# Patient Record
Sex: Female | Born: 1960 | Race: White | Hispanic: No | Marital: Single | Smoking: Current some day smoker
Health system: Southern US, Community
[De-identification: ages and names within clinical notes are randomized; demographics above are authoritative.]

## PROBLEM LIST (undated history)

## (undated) DIAGNOSIS — I10 Essential (primary) hypertension: Secondary | ICD-10-CM

## (undated) DIAGNOSIS — M1990 Unspecified osteoarthritis, unspecified site: Secondary | ICD-10-CM

## (undated) DIAGNOSIS — J45909 Unspecified asthma, uncomplicated: Secondary | ICD-10-CM

## (undated) DIAGNOSIS — E079 Disorder of thyroid, unspecified: Secondary | ICD-10-CM

## (undated) DIAGNOSIS — F419 Anxiety disorder, unspecified: Secondary | ICD-10-CM

## (undated) DIAGNOSIS — I509 Heart failure, unspecified: Secondary | ICD-10-CM

## (undated) DIAGNOSIS — I219 Acute myocardial infarction, unspecified: Secondary | ICD-10-CM

## (undated) DIAGNOSIS — E785 Hyperlipidemia, unspecified: Secondary | ICD-10-CM

## (undated) DIAGNOSIS — T8092XA Unspecified transfusion reaction, initial encounter: Secondary | ICD-10-CM

## (undated) DIAGNOSIS — R197 Diarrhea, unspecified: Secondary | ICD-10-CM

## (undated) DIAGNOSIS — T7840XA Allergy, unspecified, initial encounter: Secondary | ICD-10-CM

## (undated) HISTORY — DX: Essential (primary) hypertension: I10

## (undated) HISTORY — DX: Anxiety disorder, unspecified: F41.9

## (undated) HISTORY — PX: ANKLE SURGERY: SHX546

## (undated) HISTORY — DX: Disorder of thyroid, unspecified: E07.9

## (undated) HISTORY — DX: Unspecified asthma, uncomplicated: J45.909

## (undated) HISTORY — DX: Unspecified osteoarthritis, unspecified site: M19.90

## (undated) HISTORY — PX: KNEE SURGERY: SHX244

## (undated) HISTORY — DX: Unspecified transfusion reaction, initial encounter: T80.92XA

## (undated) HISTORY — PX: WRIST SURGERY: SHX841

## (undated) HISTORY — DX: Hyperlipidemia, unspecified: E78.5

## (undated) HISTORY — DX: Allergy, unspecified, initial encounter: T78.40XA

## (undated) HISTORY — PX: TUBAL LIGATION: SHX77

## (undated) HISTORY — DX: Heart failure, unspecified: I50.9

## (undated) HISTORY — DX: Diarrhea, unspecified: R19.7

## (undated) HISTORY — DX: Acute myocardial infarction, unspecified: I21.9

## (undated) HISTORY — PX: JOINT REPLACEMENT: SHX530

## (undated) HISTORY — PX: ABDOMINAL HYSTERECTOMY: SHX81

---

## 1995-05-15 HISTORY — PX: BACK SURGERY: SHX140

## 1999-05-15 HISTORY — PX: FRACTURE SURGERY: SHX138

## 2012-05-14 DIAGNOSIS — I219 Acute myocardial infarction, unspecified: Secondary | ICD-10-CM

## 2012-05-14 HISTORY — DX: Acute myocardial infarction, unspecified: I21.9

## 2015-11-09 DIAGNOSIS — I10 Essential (primary) hypertension: Secondary | ICD-10-CM | POA: Insufficient documentation

## 2015-11-09 DIAGNOSIS — G894 Chronic pain syndrome: Secondary | ICD-10-CM | POA: Insufficient documentation

## 2016-01-05 DIAGNOSIS — Z9861 Coronary angioplasty status: Secondary | ICD-10-CM

## 2016-01-05 DIAGNOSIS — F411 Generalized anxiety disorder: Secondary | ICD-10-CM | POA: Insufficient documentation

## 2016-01-05 DIAGNOSIS — E785 Hyperlipidemia, unspecified: Secondary | ICD-10-CM | POA: Insufficient documentation

## 2016-01-05 DIAGNOSIS — M153 Secondary multiple arthritis: Secondary | ICD-10-CM | POA: Insufficient documentation

## 2016-01-05 DIAGNOSIS — I252 Old myocardial infarction: Secondary | ICD-10-CM | POA: Insufficient documentation

## 2016-01-05 DIAGNOSIS — I2510 Atherosclerotic heart disease of native coronary artery without angina pectoris: Secondary | ICD-10-CM | POA: Insufficient documentation

## 2016-01-05 DIAGNOSIS — I429 Cardiomyopathy, unspecified: Secondary | ICD-10-CM | POA: Insufficient documentation

## 2016-01-05 DIAGNOSIS — F4310 Post-traumatic stress disorder, unspecified: Secondary | ICD-10-CM | POA: Insufficient documentation

## 2016-01-05 DIAGNOSIS — E876 Hypokalemia: Secondary | ICD-10-CM | POA: Insufficient documentation

## 2017-03-05 ENCOUNTER — Ambulatory Visit: Payer: Medicare Other | Attending: Nurse Practitioner | Admitting: Nurse Practitioner

## 2017-03-05 ENCOUNTER — Encounter: Payer: Self-pay | Admitting: Nurse Practitioner

## 2017-03-05 DIAGNOSIS — J45909 Unspecified asthma, uncomplicated: Secondary | ICD-10-CM | POA: Insufficient documentation

## 2017-03-05 DIAGNOSIS — M25552 Pain in left hip: Secondary | ICD-10-CM | POA: Insufficient documentation

## 2017-03-05 DIAGNOSIS — E785 Hyperlipidemia, unspecified: Secondary | ICD-10-CM | POA: Diagnosis not present

## 2017-03-05 DIAGNOSIS — M25561 Pain in right knee: Secondary | ICD-10-CM | POA: Diagnosis not present

## 2017-03-05 DIAGNOSIS — I429 Cardiomyopathy, unspecified: Secondary | ICD-10-CM | POA: Diagnosis not present

## 2017-03-05 DIAGNOSIS — Z96643 Presence of artificial hip joint, bilateral: Secondary | ICD-10-CM | POA: Insufficient documentation

## 2017-03-05 DIAGNOSIS — I252 Old myocardial infarction: Secondary | ICD-10-CM | POA: Diagnosis not present

## 2017-03-05 DIAGNOSIS — G8929 Other chronic pain: Secondary | ICD-10-CM

## 2017-03-05 DIAGNOSIS — Z79899 Other long term (current) drug therapy: Secondary | ICD-10-CM

## 2017-03-05 DIAGNOSIS — M1990 Unspecified osteoarthritis, unspecified site: Secondary | ICD-10-CM | POA: Insufficient documentation

## 2017-03-05 DIAGNOSIS — F17210 Nicotine dependence, cigarettes, uncomplicated: Secondary | ICD-10-CM | POA: Insufficient documentation

## 2017-03-05 DIAGNOSIS — M25562 Pain in left knee: Secondary | ICD-10-CM | POA: Diagnosis not present

## 2017-03-05 DIAGNOSIS — M1992 Post-traumatic osteoarthritis, unspecified site: Secondary | ICD-10-CM | POA: Insufficient documentation

## 2017-03-05 DIAGNOSIS — Z79891 Long term (current) use of opiate analgesic: Secondary | ICD-10-CM | POA: Diagnosis not present

## 2017-03-05 DIAGNOSIS — M25551 Pain in right hip: Secondary | ICD-10-CM | POA: Diagnosis not present

## 2017-03-05 DIAGNOSIS — E079 Disorder of thyroid, unspecified: Secondary | ICD-10-CM | POA: Insufficient documentation

## 2017-03-05 DIAGNOSIS — M545 Low back pain, unspecified: Secondary | ICD-10-CM

## 2017-03-05 DIAGNOSIS — F1190 Opioid use, unspecified, uncomplicated: Secondary | ICD-10-CM | POA: Diagnosis not present

## 2017-03-05 DIAGNOSIS — M542 Cervicalgia: Secondary | ICD-10-CM | POA: Diagnosis not present

## 2017-03-05 DIAGNOSIS — Z90710 Acquired absence of both cervix and uterus: Secondary | ICD-10-CM | POA: Insufficient documentation

## 2017-03-05 DIAGNOSIS — G894 Chronic pain syndrome: Secondary | ICD-10-CM | POA: Insufficient documentation

## 2017-03-05 DIAGNOSIS — F4310 Post-traumatic stress disorder, unspecified: Secondary | ICD-10-CM | POA: Insufficient documentation

## 2017-03-05 DIAGNOSIS — I2510 Atherosclerotic heart disease of native coronary artery without angina pectoris: Secondary | ICD-10-CM | POA: Insufficient documentation

## 2017-03-05 DIAGNOSIS — M5441 Lumbago with sciatica, right side: Secondary | ICD-10-CM

## 2017-03-05 DIAGNOSIS — I509 Heart failure, unspecified: Secondary | ICD-10-CM | POA: Insufficient documentation

## 2017-03-05 DIAGNOSIS — Z8249 Family history of ischemic heart disease and other diseases of the circulatory system: Secondary | ICD-10-CM | POA: Insufficient documentation

## 2017-03-05 DIAGNOSIS — F411 Generalized anxiety disorder: Secondary | ICD-10-CM | POA: Diagnosis not present

## 2017-03-05 DIAGNOSIS — Z9861 Coronary angioplasty status: Secondary | ICD-10-CM | POA: Insufficient documentation

## 2017-03-05 DIAGNOSIS — E876 Hypokalemia: Secondary | ICD-10-CM | POA: Insufficient documentation

## 2017-03-05 DIAGNOSIS — Z7902 Long term (current) use of antithrombotics/antiplatelets: Secondary | ICD-10-CM | POA: Insufficient documentation

## 2017-03-05 DIAGNOSIS — M899 Disorder of bone, unspecified: Secondary | ICD-10-CM | POA: Diagnosis not present

## 2017-03-05 DIAGNOSIS — Z7951 Long term (current) use of inhaled steroids: Secondary | ICD-10-CM | POA: Insufficient documentation

## 2017-03-05 DIAGNOSIS — I110 Hypertensive heart disease with heart failure: Secondary | ICD-10-CM | POA: Diagnosis not present

## 2017-03-05 DIAGNOSIS — Z789 Other specified health status: Secondary | ICD-10-CM

## 2017-03-05 NOTE — Progress Notes (Addendum)
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 than 24 hours prior to the originally scheduled appointment. This allows for the appointment to be offered to other patients. 9. Rescheduling:  When a visit is rescheduled, it will require the cancellation of the original appointment. For this reason they both fall within the category of "Cancellations".  10. Cancellations:  They require advanced notification. Any cancellation less than 24 hours before the  appointment will be recorded as a "No Show". 11. No Show:  Defined as an unkept appointment where the patient failed to notify or declare to the practice their intention or inability to keep the appointment.  Corrective process for repeat offenders:  1. Tardiness: Three (3) episodes of rescheduling due to late arrivals will be recorded as one (1) "No Show". 2. Cancellation or reschedule: Three (3) cancellations or rescheduling will be recorded as one (1) "No Show". 3. "No Shows": Three (3) "No Shows" within a 12 month period will result in discharge from the  practice.  ____________________________________________________________________________________________  ____________________________________________________________________________________________  Pain Scale  Introduction: The pain score used by this practice is the Verbal Numerical Rating Scale (VNRS-11). This is an 11-point scale. It is for adults and children 10 years or older. There are significant differences in how the pain score is reported, used, and applied. Forget everything you learned in the past and learn this scoring system.  General Information: The scale should reflect your current level of pain. Unless you are specifically asked for the level of your  worst pain, or your average pain. If you are asked for one of these two, then it should be understood that it is over the past 24 hours.  Basic Activities of Daily Living (ADL): Personal hygiene, dressing, eating, transferring, and using restroom.  Instructions: Most patients tend to report their level of pain as a combination of two factors, their physical pain and their psychosocial pain. This last one is also known as "suffering" and it is reflection of how physical pain affects you socially and psychologically. From now on, report them separately. From this point on, when asked to report your pain level, report only your physical pain. Use the following table for reference.  Pain Clinic Pain Levels (0-5/10)  Pain Level Score  Description  No Pain 0   Mild pain 1 Nagging, annoying, but does not interfere with basic activities of daily living (ADL). Patients are able to eat, bathe, get dressed, toileting (being able to get on and off the toilet and perform personal hygiene functions), transfer (move in and out of bed or a chair without assistance), and maintain continence (able to control bladder and bowel functions). Blood pressure and heart rate are unaffected. A normal heart rate for a healthy adult ranges from 60 to 100 bpm  (beats per minute).   Mild to moderate pain 2 Noticeable and distracting. Impossible to hide from other people. More frequent flare-ups. Still possible to adapt and function close to normal. It can be very annoying and may have occasional stronger flare-ups. With discipline, patients may get used to it and adapt.   Moderate pain 3 Interferes significantly with activities of daily living (ADL). It becomes difficult to feed, bathe, get dressed, get on and off the toilet or to perform personal hygiene functions. Difficult to get in and out of bed or a chair without assistance. Very distracting. With effort, it can be ignored when deeply involved in activities.   Moderately severe pain 4 Impossible to ignore for more than a few minutes. With effort, patients may still be able to manage work or participate in some social activities. Very difficult to concentrate. Signs of autonomic nervous system discharge are evident: dilated pupils (mydriasis); mild sweating (diaphoresis); sleep interference. Heart rate becomes elevated (>115 bpm). Diastolic blood pressure (lower number) rises above 100 mmHg. Patients find relief in laying down and not moving.   Severe pain 5 Intense and extremely unpleasant. Associated with frowning face and frequent crying. Pain overwhelms the senses.  Ability to do any activity or maintain social relationships becomes significantly limited. Conversation becomes difficult. Pacing back and forth is common, as getting into a comfortable position is nearly impossible. Pain wakes you up from deep sleep. Physical signs will be obvious: pupillary dilation; increased sweating; goosebumps; brisk reflexes; cold, clammy hands and feet; nausea, vomiting or dry heaves; loss of appetite; significant sleep disturbance with inability to fall asleep or to remain asleep. When persistent, significant weight loss is observed due to the complete loss of appetite and sleep deprivation.  Blood pressure and heart  rate becomes significantly elevated. Caution: If elevated blood pressure triggers a pounding headache associated with blurred vision, then the patient should immediately seek attention at an urgent or emergency care unit, as these may be signs of an impending stroke.    Emergency Department Pain Levels (6-10/10)  Emergency Room Pain 6 Severely limiting. Requires emergency care and should not be seen or managed at an outpatient pain management facility. Communication becomes difficult and requires great effort. Assistance  to reach the emergency department may be required. Facial flushing and profuse sweating along with potentially dangerous increases in heart rate and blood pressure will be evident.   Distressing pain 7 Self-care is very difficult. Assistance is required to transport, or use restroom. Assistance to reach the emergency department will be required. Tasks requiring coordination, such as bathing and getting dressed become very difficult.   Disabling pain 8 Self-care is no longer possible. At this level, pain is disabling. The individual is unable to do even the most "basic" activities such as walking, eating, bathing, dressing, transferring to a bed, or toileting. Fine motor skills are lost. It is difficult to think clearly.   Incapacitating pain 9 Pain becomes incapacitating. Thought processing is no longer possible. Difficult to remember your own name. Control of movement and coordination are lost.   The worst pain imaginable 10 At this level, most patients pass out from pain. When this level is reached, collapse of the autonomic nervous system occurs, leading to a sudden drop in blood pressure and heart rate. This in turn results in a temporary and dramatic drop in blood flow to the brain, leading to a loss of consciousness. Fainting is one of the body's self defense mechanisms. Passing out puts the brain in a calmed state and causes it to shut down for a while, in order to begin the  healing process.    Summary: 1. Refer to this scale when providing [place] with your pain level. 2. Be accurate and careful when reporting your pain level. This will help with your care. 3. Over-reporting your pain level will lead to loss of credibility. 4. Even a level of 1/10 means that there is pain and will be treated at our facility. 5. High, inaccurate reporting will be documented as "Symptom Exaggeration", leading to loss of credibility and suspicions of possible secondary gains such as obtaining more narcotics, or wanting to appear disabled, for fraudulent reasons. 6. Only pain levels of 5 or below will be seen at our facility. 7. Pain levels of 6 and above will be sent to the Emergency Department and the appointment cancelled. ____________________________________________________________________________________________

## 2017-03-05 NOTE — Patient Instructions (Addendum)
You have been instructed to get xrays done and see the medical psychologist as soon as possible and call us for 2nd appointment.  _________________________________________________________________________________________  Appointment Policy Summary  It is our goal and responsibility to provide the medical community with assistance in the evaluation and management of patients with chronic pain. Unfortunately our resources are limited. Because we do not have an unlimited amount of time, or available appointments, we are required to closely monitor and manage their use. The following rules exist to maximize their use:  Patient's responsibilities: 1. Punctuality:  At what time should I arrive? You should be physically present in our office 30 minutes before your scheduled appointment. Your scheduled appointment is with your assigned healthcare provider. However, it takes 5-10 minutes to be "checked-in", and another 15 minutes for the nurses to do the admission. If you arrive to our office at the time you were given for your appointment, you will end up being at least 20-25 minutes late to your appointment with the provider. 2. Tardiness:  What happens if I arrive only a few minutes after my scheduled appointment time? You will need to reschedule your appointment. The cutoff is your appointment time. This is why it is so important that you arrive at least 30 minutes before that appointment. If you have an appointment scheduled for 10:00 AM and you arrive at 10:01, you will be required to reschedule your appointment.  3. Plan ahead:  Always assume that you will encounter traffic on your way in. Plan for it. If you are dependent on a driver, make sure they understand these rules and the need to arrive early. 4. Other appointments and responsibilities:  Avoid scheduling any other appointments before or after your pain clinic appointments.  5. Be prepared:  Write down everything that you need to discuss with  your healthcare provider and give this information to the admitting nurse. Write down the medications that you will need refilled. Bring your pills and bottles (even the empty ones), to all of your appointments, except for those where a procedure is scheduled. 6. No children or pets:  Find someone to take care of them. It is not appropriate to bring them in. 7. Scheduling changes:  We request "advanced notification" of any changes or cancellations. 8. Advanced notification:  Defined as a time period of more than 24 hours prior to the originally scheduled appointment. This allows for the appointment to be offered to other patients. 9. Rescheduling:  When a visit is rescheduled, it will require the cancellation of the original appointment. For this reason they both fall within the category of "Cancellations".  10. Cancellations:  They require advanced notification. Any cancellation less than 24 hours before the  appointment will be recorded as a "No Show". 11. No Show:  Defined as an unkept appointment where the patient failed to notify or declare to the practice their intention or inability to keep the appointment.  Corrective process for repeat offenders:  1. Tardiness: Three (3) episodes of rescheduling due to late arrivals will be recorded as one (1) "No Show". 2. Cancellation or reschedule: Three (3) cancellations or rescheduling will be recorded as one (1) "No Show". 3. "No Shows": Three (3) "No Shows" within a 12 month period will result in discharge from the practice.  ____________________________________________________________________________________________  ____________________________________________________________________________________________  Pain Scale  Introduction: The pain score used by this practice is the Verbal Numerical Rating Scale (VNRS-11). This is an 11-point scale. It is for adults and children 10 years  or older. There are significant differences in how the pain  score is reported, used, and applied. Forget everything you learned in the past and learn this scoring system.  General Information: The scale should reflect your current level of pain. Unless you are specifically asked for the level of your worst pain, or your average pain. If you are asked for one of these two, then it should be understood that it is over the past 24 hours.  Basic Activities of Daily Living (ADL): Personal hygiene, dressing, eating, transferring, and using restroom.  Instructions: Most patients tend to report their level of pain as a combination of two factors, their physical pain and their psychosocial pain. This last one is also known as "suffering" and it is reflection of how physical pain affects you socially and psychologically. From now on, report them separately. From this point on, when asked to report your pain level, report only your physical pain. Use the following table for reference.  Pain Clinic Pain Levels (0-5/10)  Pain Level Score  Description  No Pain 0   Mild pain 1 Nagging, annoying, but does not interfere with basic activities of daily living (ADL). Patients are able to eat, bathe, get dressed, toileting (being able to get on and off the toilet and perform personal hygiene functions), transfer (move in and out of bed or a chair without assistance), and maintain continence (able to control bladder and bowel functions). Blood pressure and heart rate are unaffected. A normal heart rate for a healthy adult ranges from 60 to 100 bpm (beats per minute).   Mild to moderate pain 2 Noticeable and distracting. Impossible to hide from other people. More frequent flare-ups. Still possible to adapt and function close to normal. It can be very annoying and may have occasional stronger flare-ups. With discipline, patients may get used to it and adapt.   Moderate pain 3 Interferes significantly with activities of daily living (ADL). It becomes difficult to feed, bathe, get  dressed, get on and off the toilet or to perform personal hygiene functions. Difficult to get in and out of bed or a chair without assistance. Very distracting. With effort, it can be ignored when deeply involved in activities.   Moderately severe pain 4 Impossible to ignore for more than a few minutes. With effort, patients may still be able to manage work or participate in some social activities. Very difficult to concentrate. Signs of autonomic nervous system discharge are evident: dilated pupils (mydriasis); mild sweating (diaphoresis); sleep interference. Heart rate becomes elevated (>115 bpm). Diastolic blood pressure (lower number) rises above 100 mmHg. Patients find relief in laying down and not moving.   Severe pain 5 Intense and extremely unpleasant. Associated with frowning face and frequent crying. Pain overwhelms the senses.  Ability to do any activity or maintain social relationships becomes significantly limited. Conversation becomes difficult. Pacing back and forth is common, as getting into a comfortable position is nearly impossible. Pain wakes you up from deep sleep. Physical signs will be obvious: pupillary dilation; increased sweating; goosebumps; brisk reflexes; cold, clammy hands and feet; nausea, vomiting or dry heaves; loss of appetite; significant sleep disturbance with inability to fall asleep or to remain asleep. When persistent, significant weight loss is observed due to the complete loss of appetite and sleep deprivation.  Blood pressure and heart rate becomes significantly elevated. Caution: If elevated blood pressure triggers a pounding headache associated with blurred vision, then the patient should immediately seek attention at an urgent or emergency  care unit, as these may be signs of an impending stroke.    Emergency Department Pain Levels (6-10/10)  Emergency Room Pain 6 Severely limiting. Requires emergency care and should not be seen or managed at an outpatient pain  management facility. Communication becomes difficult and requires great effort. Assistance to reach the emergency department may be required. Facial flushing and profuse sweating along with potentially dangerous increases in heart rate and blood pressure will be evident.   Distressing pain 7 Self-care is very difficult. Assistance is required to transport, or use restroom. Assistance to reach the emergency department will be required. Tasks requiring coordination, such as bathing and getting dressed become very difficult.   Disabling pain 8 Self-care is no longer possible. At this level, pain is disabling. The individual is unable to do even the most "basic" activities such as walking, eating, bathing, dressing, transferring to a bed, or toileting. Fine motor skills are lost. It is difficult to think clearly.   Incapacitating pain 9 Pain becomes incapacitating. Thought processing is no longer possible. Difficult to remember your own name. Control of movement and coordination are lost.   The worst pain imaginable 10 At this level, most patients pass out from pain. When this level is reached, collapse of the autonomic nervous system occurs, leading to a sudden drop in blood pressure and heart rate. This in turn results in a temporary and dramatic drop in blood flow to the brain, leading to a loss of consciousness. Fainting is one of the body's self defense mechanisms. Passing out puts the brain in a calmed state and causes it to shut down for a while, in order to begin the healing process.    Summary: 1. Refer to this scale when providing [place] with your pain level. 2. Be accurate and careful when reporting your pain level. This will help with your care. 3. Over-reporting your pain level will lead to loss of credibility. 4. Even a level of 1/10 means that there is pain and will be treated at our facility. 5. High, inaccurate reporting will be documented as "Symptom Exaggeration", leading to loss of  credibility and suspicions of possible secondary gains such as obtaining more narcotics, or wanting to appear disabled, for fraudulent reasons. 6. Only pain levels of 5 or below will be seen at our facility. 7. Pain levels of 6 and above will be sent to the Emergency Department and the appointment cancelled. ____________________________________________________________________________________________

## 2017-03-07 ENCOUNTER — Ambulatory Visit
Admission: RE | Admit: 2017-03-07 | Discharge: 2017-03-07 | Disposition: A | Discharge status: Home / Self Care | Payer: Medicare Other | Source: Ambulatory Visit | Attending: Nurse Practitioner | Admitting: Nurse Practitioner

## 2017-03-07 DIAGNOSIS — M1612 Unilateral primary osteoarthritis, left hip: Secondary | ICD-10-CM | POA: Diagnosis not present

## 2017-03-07 DIAGNOSIS — M25562 Pain in left knee: Secondary | ICD-10-CM

## 2017-03-07 DIAGNOSIS — G8929 Other chronic pain: Secondary | ICD-10-CM

## 2017-03-07 DIAGNOSIS — M4856XA Collapsed vertebra, not elsewhere classified, lumbar region, initial encounter for fracture: Secondary | ICD-10-CM | POA: Diagnosis not present

## 2017-03-07 DIAGNOSIS — M4312 Spondylolisthesis, cervical region: Secondary | ICD-10-CM | POA: Insufficient documentation

## 2017-03-07 DIAGNOSIS — M5136 Other intervertebral disc degeneration, lumbar region: Secondary | ICD-10-CM | POA: Diagnosis not present

## 2017-03-07 DIAGNOSIS — M5134 Other intervertebral disc degeneration, thoracic region: Secondary | ICD-10-CM | POA: Diagnosis not present

## 2017-03-07 DIAGNOSIS — M25551 Pain in right hip: Secondary | ICD-10-CM | POA: Insufficient documentation

## 2017-03-07 DIAGNOSIS — M542 Cervicalgia: Principal | ICD-10-CM

## 2017-03-07 DIAGNOSIS — M25561 Pain in right knee: Secondary | ICD-10-CM

## 2017-03-07 DIAGNOSIS — M5030 Other cervical disc degeneration, unspecified cervical region: Secondary | ICD-10-CM | POA: Diagnosis not present

## 2017-03-07 DIAGNOSIS — M545 Low back pain: Secondary | ICD-10-CM | POA: Diagnosis present

## 2017-03-07 DIAGNOSIS — M170 Bilateral primary osteoarthritis of knee: Secondary | ICD-10-CM | POA: Diagnosis not present

## 2017-03-07 DIAGNOSIS — M40202 Unspecified kyphosis, cervical region: Secondary | ICD-10-CM | POA: Insufficient documentation

## 2017-03-07 DIAGNOSIS — Z96641 Presence of right artificial hip joint: Secondary | ICD-10-CM | POA: Diagnosis not present

## 2017-03-07 DIAGNOSIS — M25552 Pain in left hip: Secondary | ICD-10-CM | POA: Diagnosis present

## 2017-03-07 IMAGING — CR DG KNEE 1-2V*L*
1 series · 2 of 2 positions shown · non-contrast
Comparison: None.

CLINICAL DATA: Chronic bilateral knee pain.

EXAM:
LEFT KNEE - 1-2 VIEW

[Series 1: dg knee 1-2 views left · 0.14mm/px · 2 of 2 slices shown]
[im 1/2]
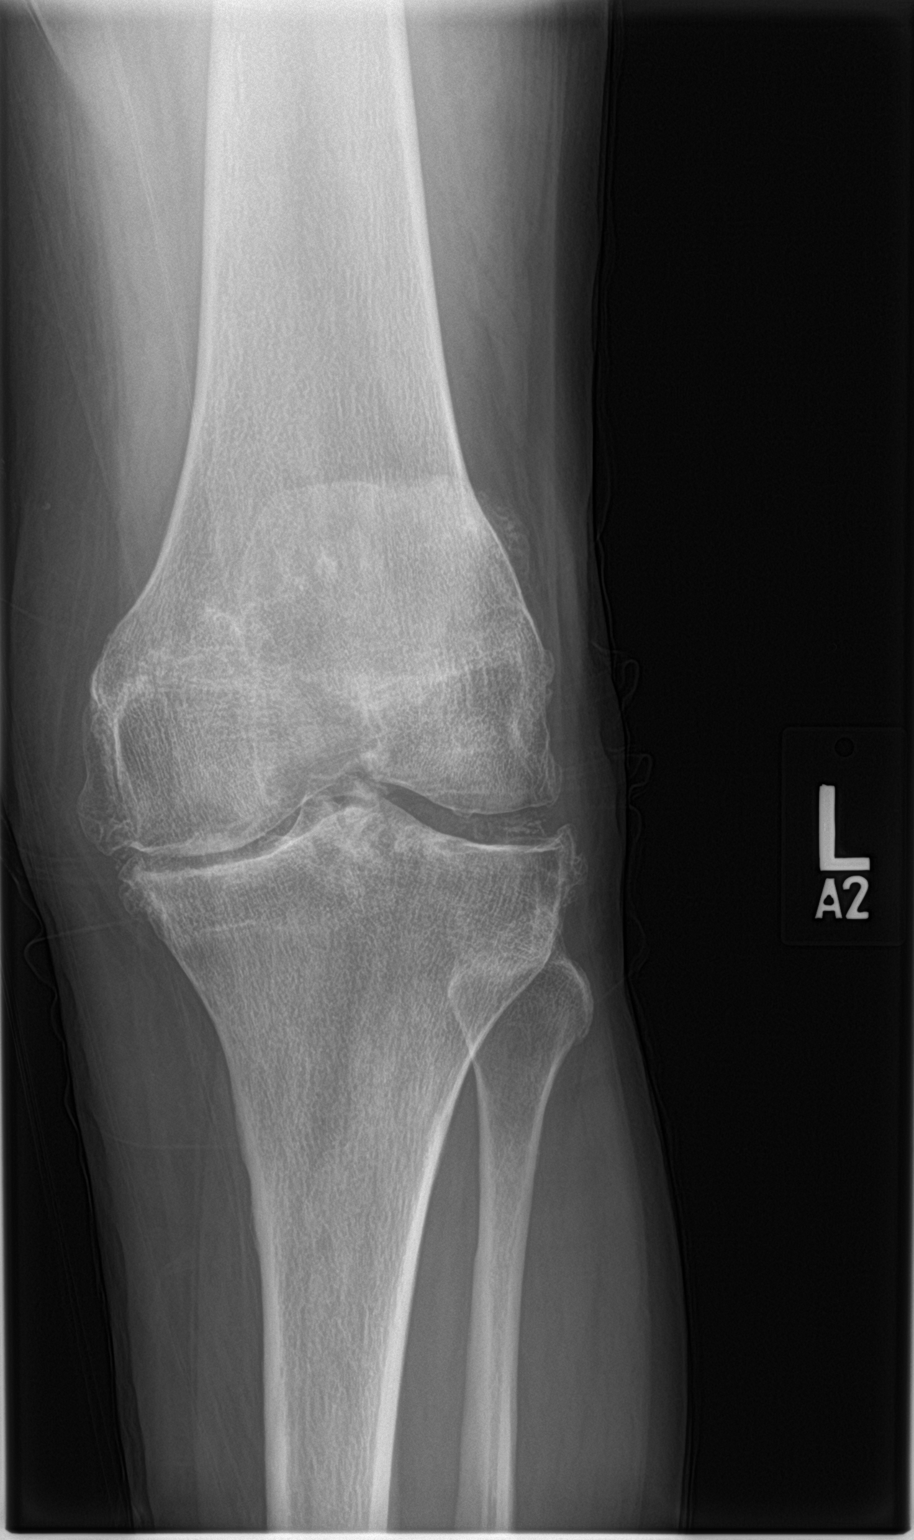
[im 2/2]
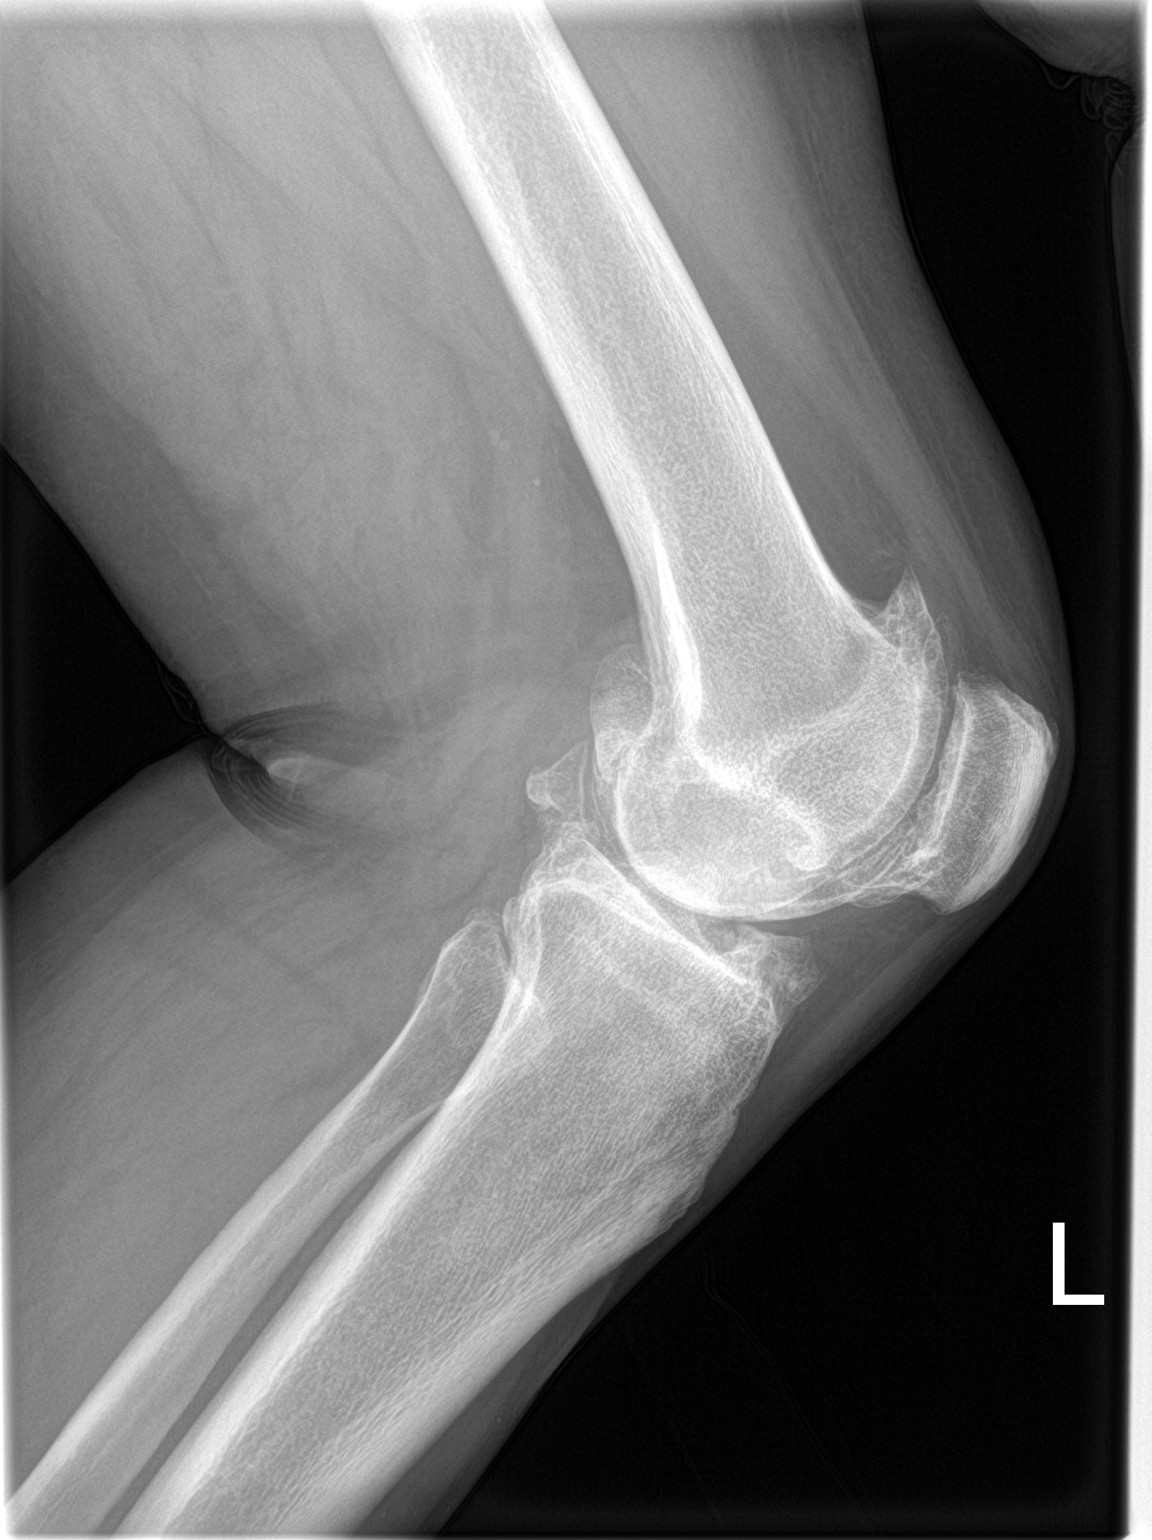

[2 of 2 positions shown; findings below may reference images not displayed]

FINDINGS: Moderate to marked medial joint space narrowing. Moderate medial and
lateral spur formation. Large patellofemoral spurs with joint space
narrowing. No effusion.
IMPRESSION: Moderate to marked tricompartmental degenerative changes.

## 2017-03-07 IMAGING — CR DG HIP (WITH OR WITHOUT PELVIS) 2-3V*L*
1 series · 3 of 3 positions shown · non-contrast
Comparison: None.

CLINICAL DATA: Chronic bilateral hip pain.

EXAM:
DG HIP (WITH OR WITHOUT PELVIS) 2-3V LEFT

[Series 1: dg hip unilat w or w/o pelvis 2-3 views  · non-contrast · 0.14mm/px · 3 of 3 slices shown]
[im 1/3]
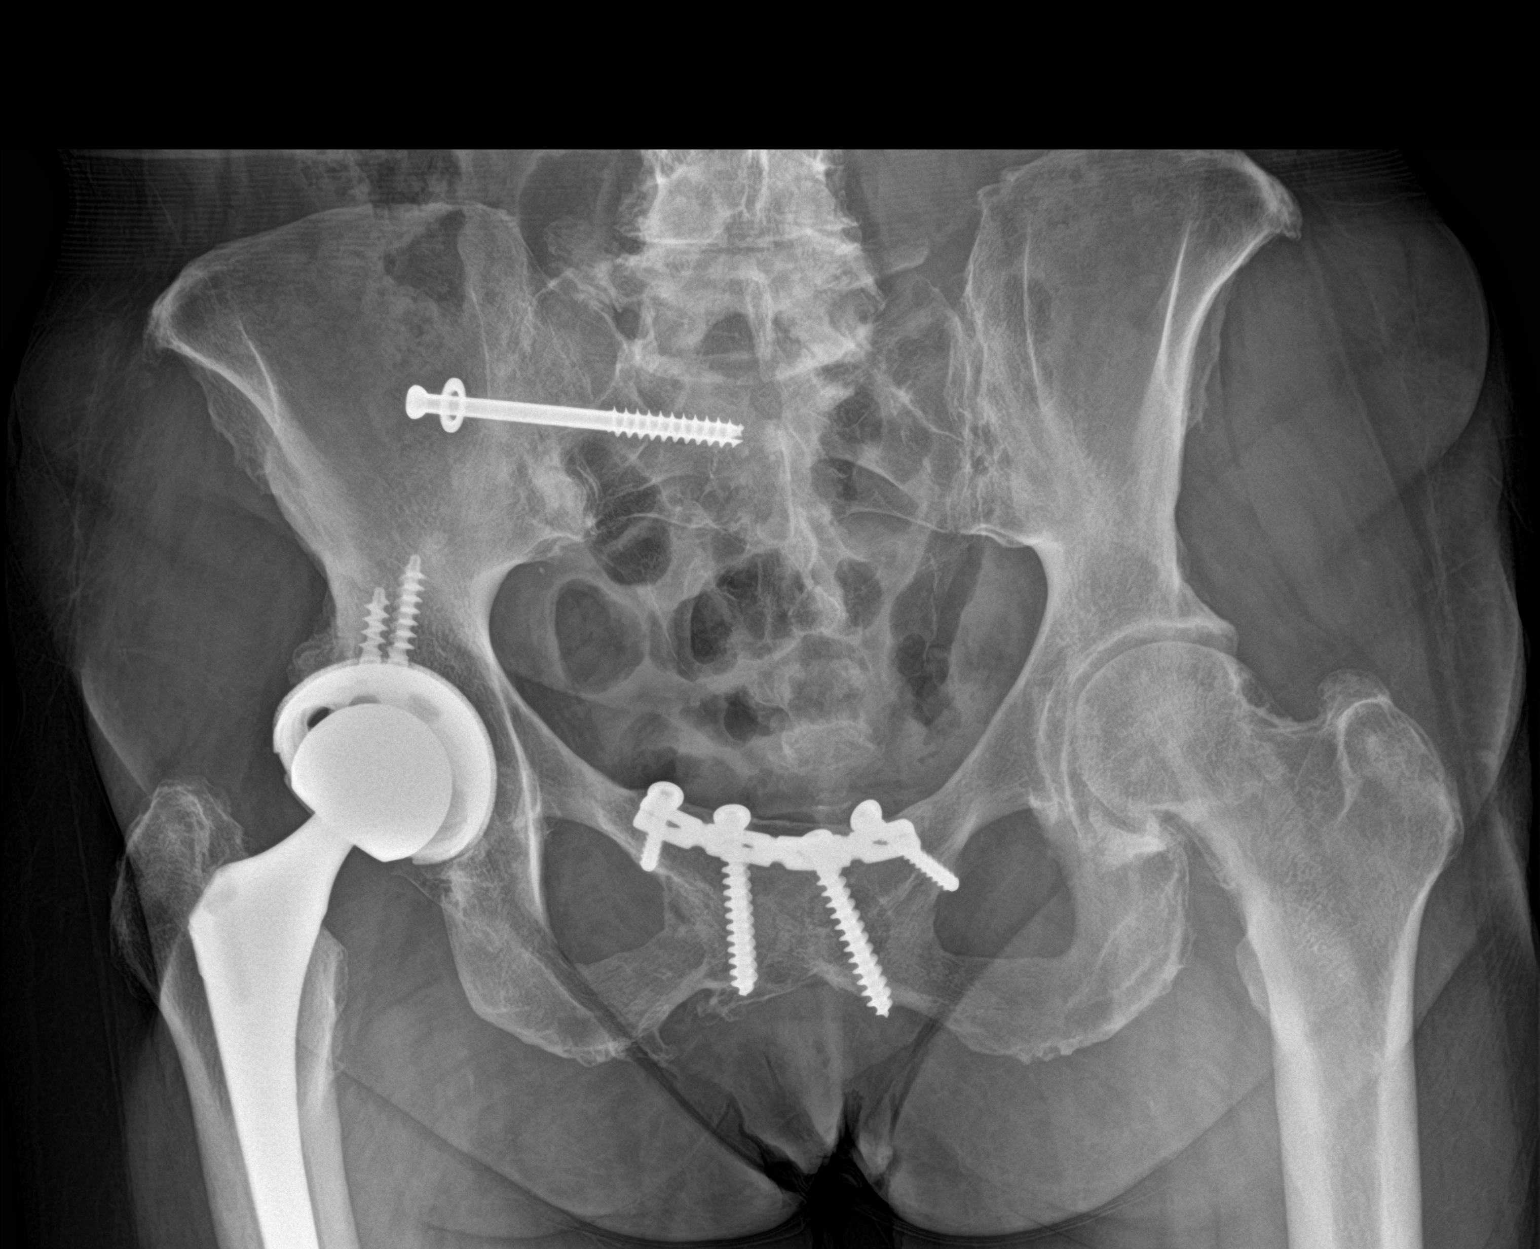
[im 2/3]
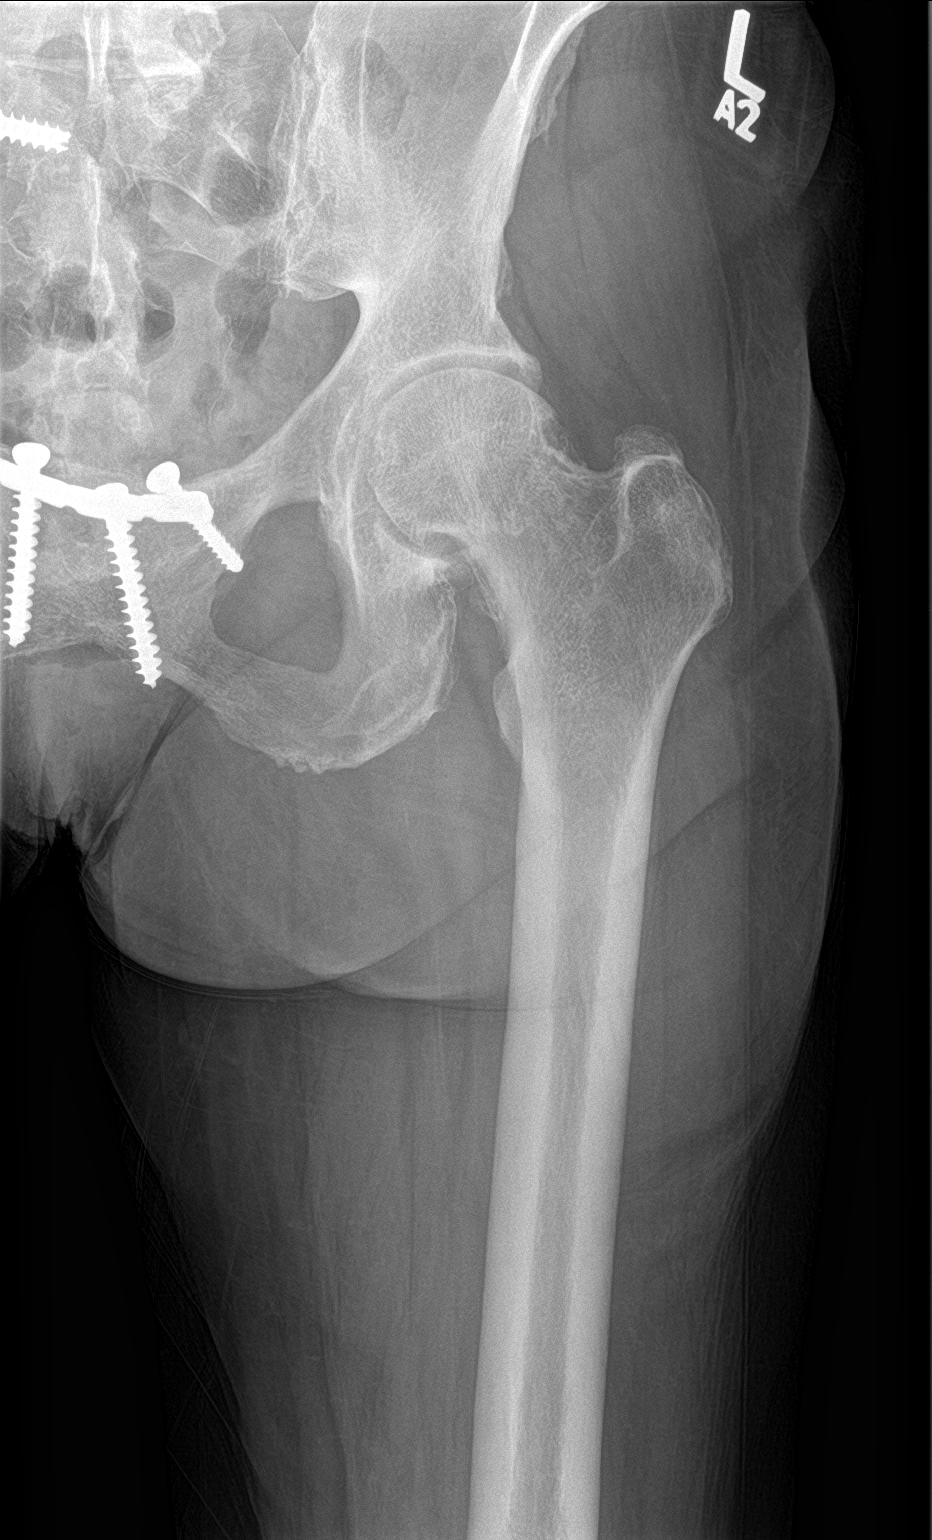
[im 3/3]
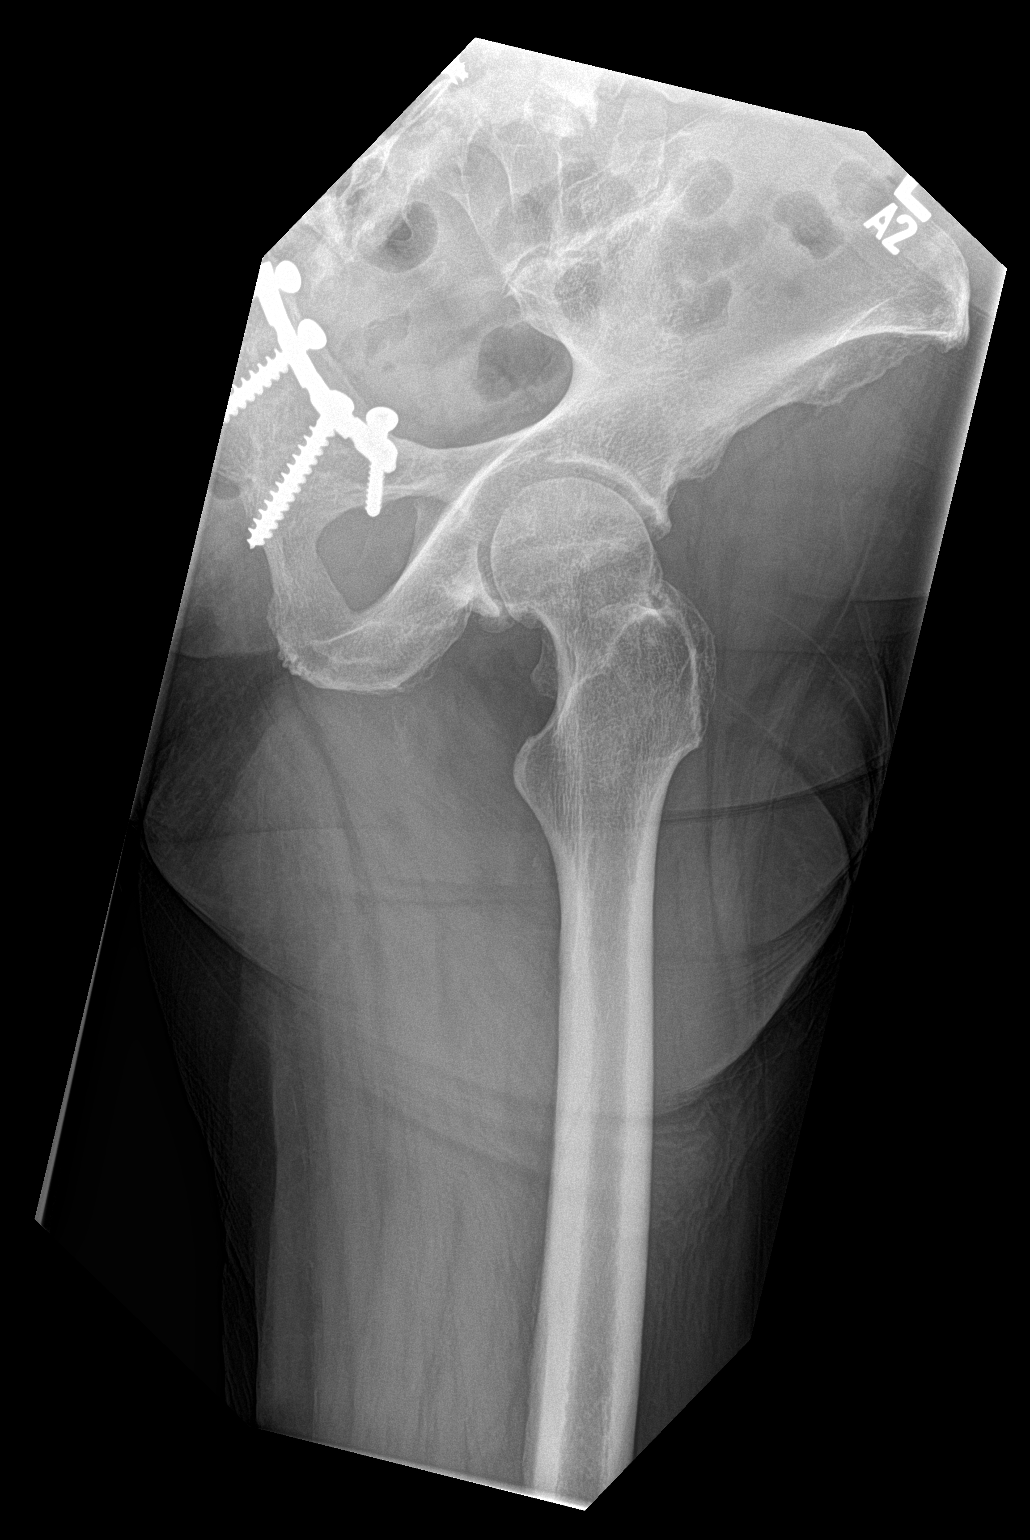

[3 of 3 positions shown; findings below may reference images not displayed]

FINDINGS: Hardware in solid bone fusion of the symphysis pubis. Right total
hip prosthesis. Right sacroiliac fixation screw. Moderate left
femoral head and neck junction spur formation and inferior
acetabular spur formation. Mild superior acetabular spur formation.
Lower lumbar spine degenerative changes.
IMPRESSION: 1. Mild to moderate left hip degenerative changes.
2. Lower lumbar spine degenerative changes.
3. Right hip prosthesis and pelvic fixation hardware.

## 2017-03-07 IMAGING — CR DG LUMBAR SPINE COMPLETE W/ BEND
1 series · 7 of 7 positions shown · non-contrast
Comparison: None.

CLINICAL DATA: Chronic bilateral lower back pain.

EXAM:
LUMBAR SPINE - COMPLETE WITH BENDING VIEWS

[Series 1: dg lumbar spine complete w/bend 6+v · 0.14mm/px · 7 of 7 slices shown]
[im 1/7]
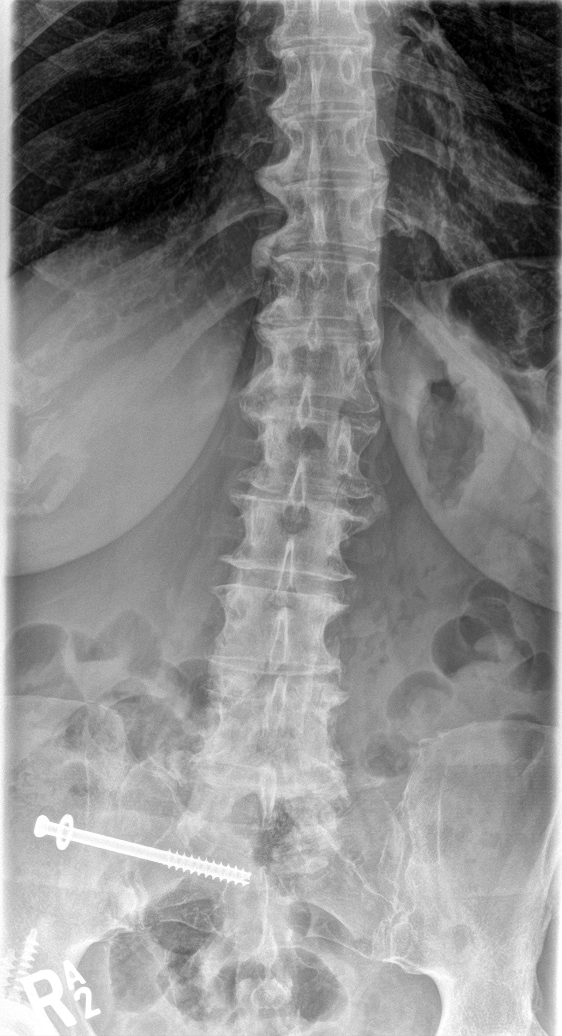
[im 2/7]
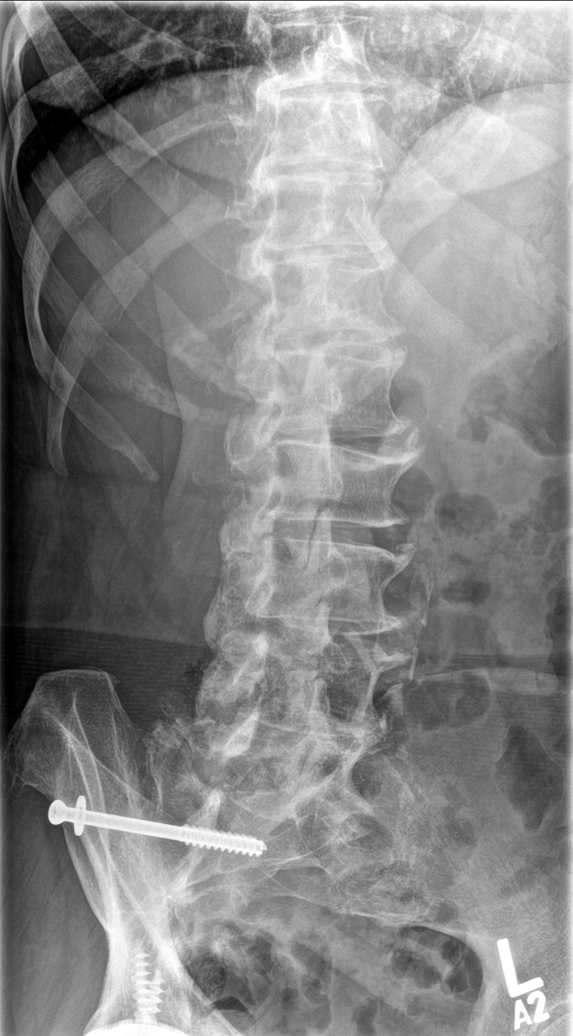
[im 3/7]
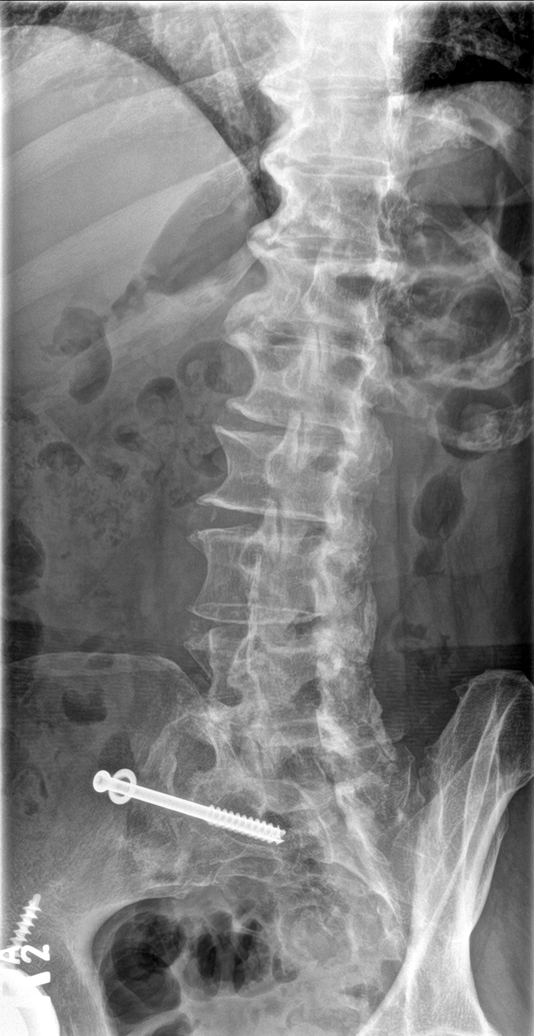
[im 4/7]
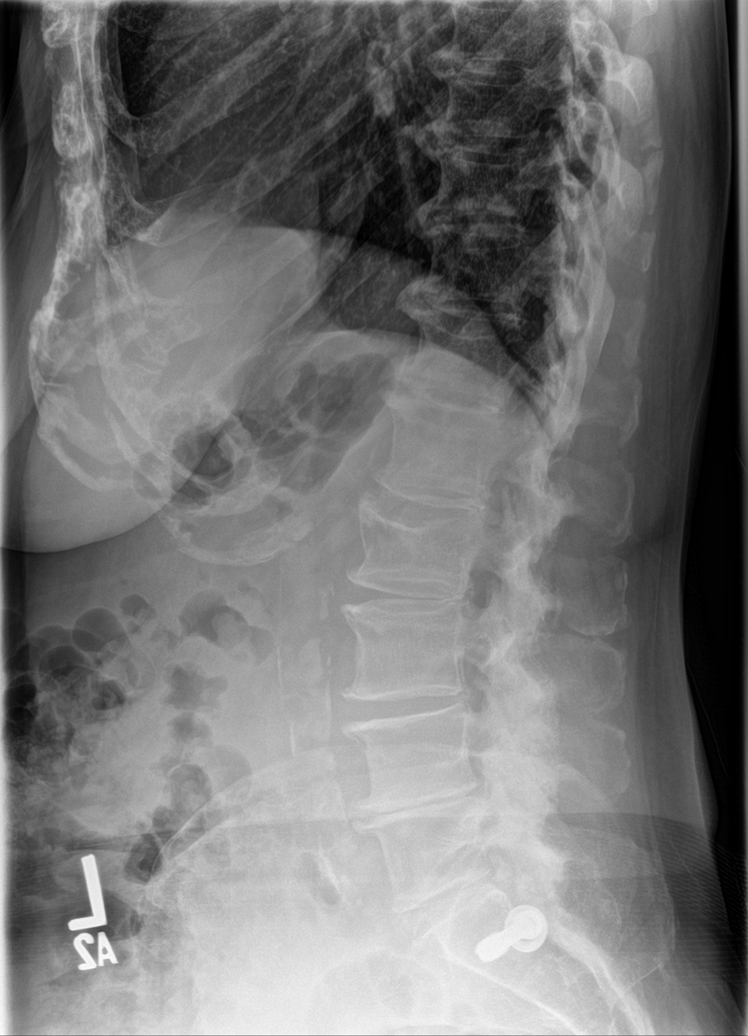
[im 5/7]
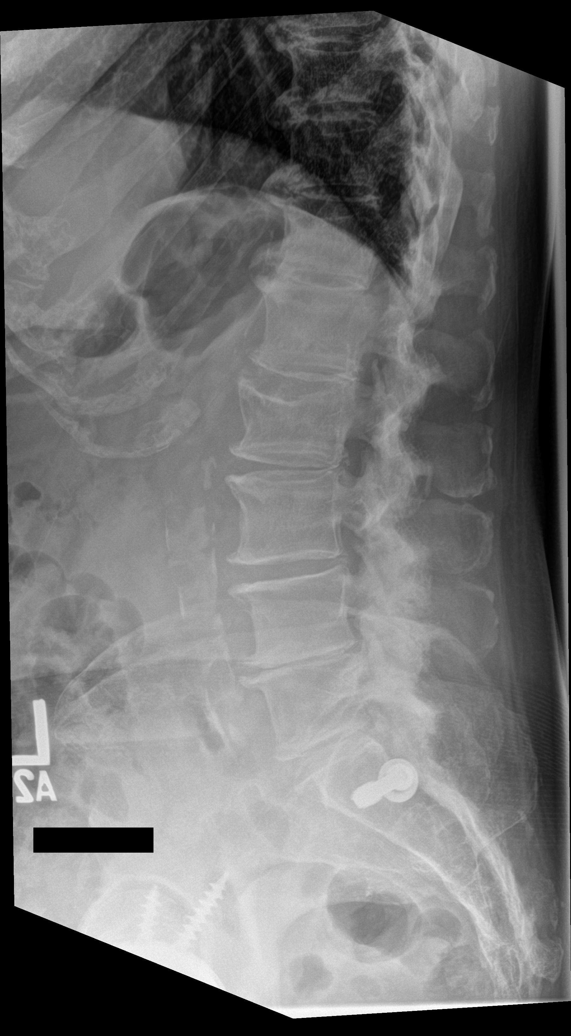
[im 6/7]
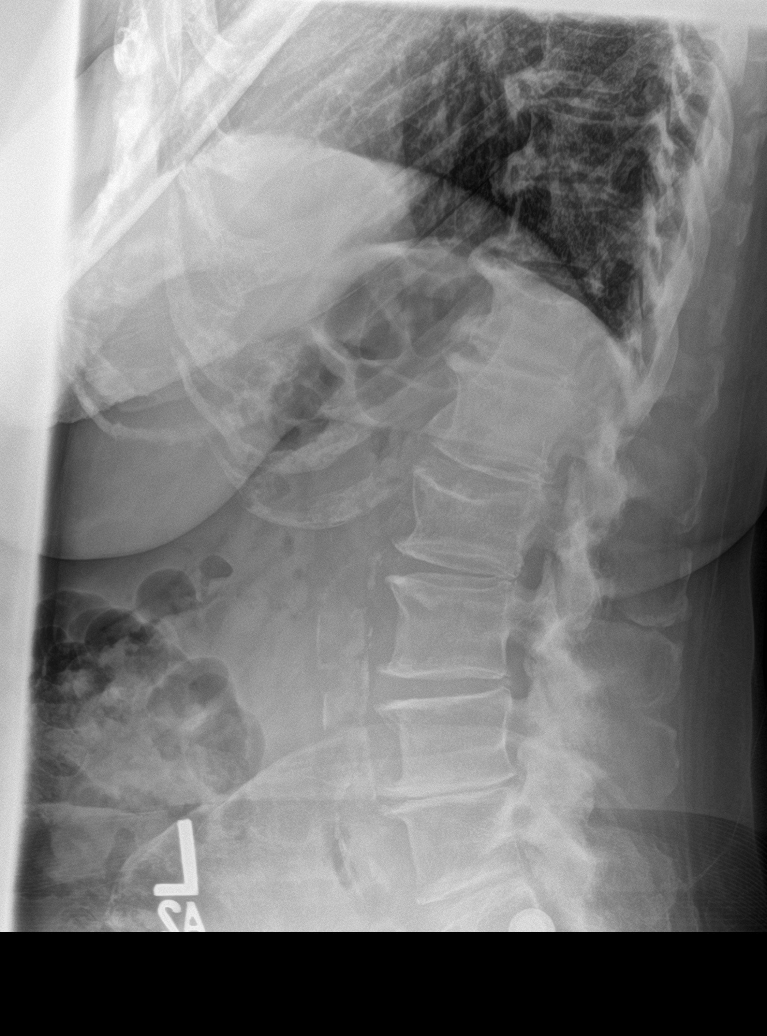
[im 7/7]
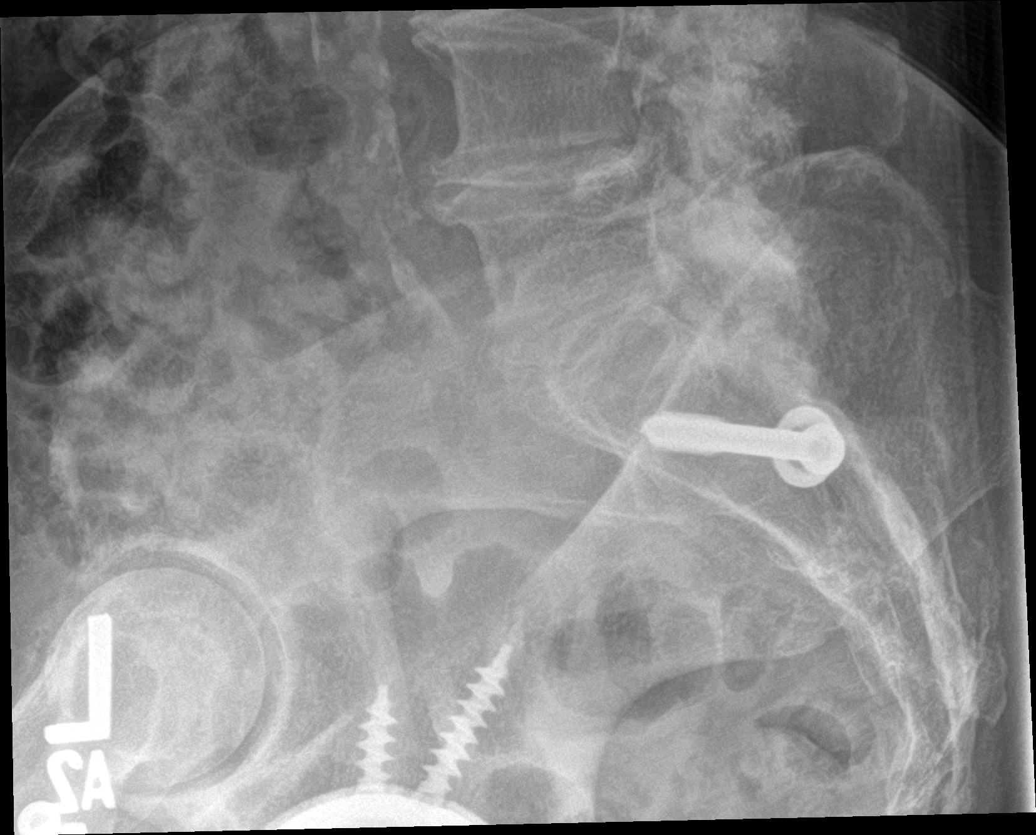

[7 of 7 positions shown; findings below may reference images not displayed]

FINDINGS: Transitional thoracolumbar vertebra followed by 5 non-rib-bearing
lumbar vertebrae. For counting purposes, the last open disc space is
labeled the L5-S1 level. Moderate anterior and lateral spur
formation throughout the lumbar and lower thoracic spine. Marked
disc space narrowing at the L4-5 level. Facet degenerative changes
throughout the lumbar spine, most pronounced at the L3-4 and L4-5
levels. Approximately 20% L2 vertebral body superior endplate
compression deformity. No visible fracture lines and no bony
retropulsion. No pars defects or subluxations. Atheromatous arterial
calcifications. Previously described pelvic fixation hardware.
IMPRESSION: 1. Extensive lumbar and lower thoracic spine degenerative changes,
as described above.
2. Approximately 20% old L2 superior endplate compression fracture.

## 2017-03-07 IMAGING — CR DG KNEE 1-2V*R*
1 series · 2 of 2 positions shown · non-contrast
Comparison: None.

CLINICAL DATA: Chronic bilateral knee pain.

EXAM:
RIGHT KNEE - 1-2 VIEW

[Series 1: dg knee 1-2 views right · 0.14mm/px · 2 of 2 slices shown]
[im 1/2]
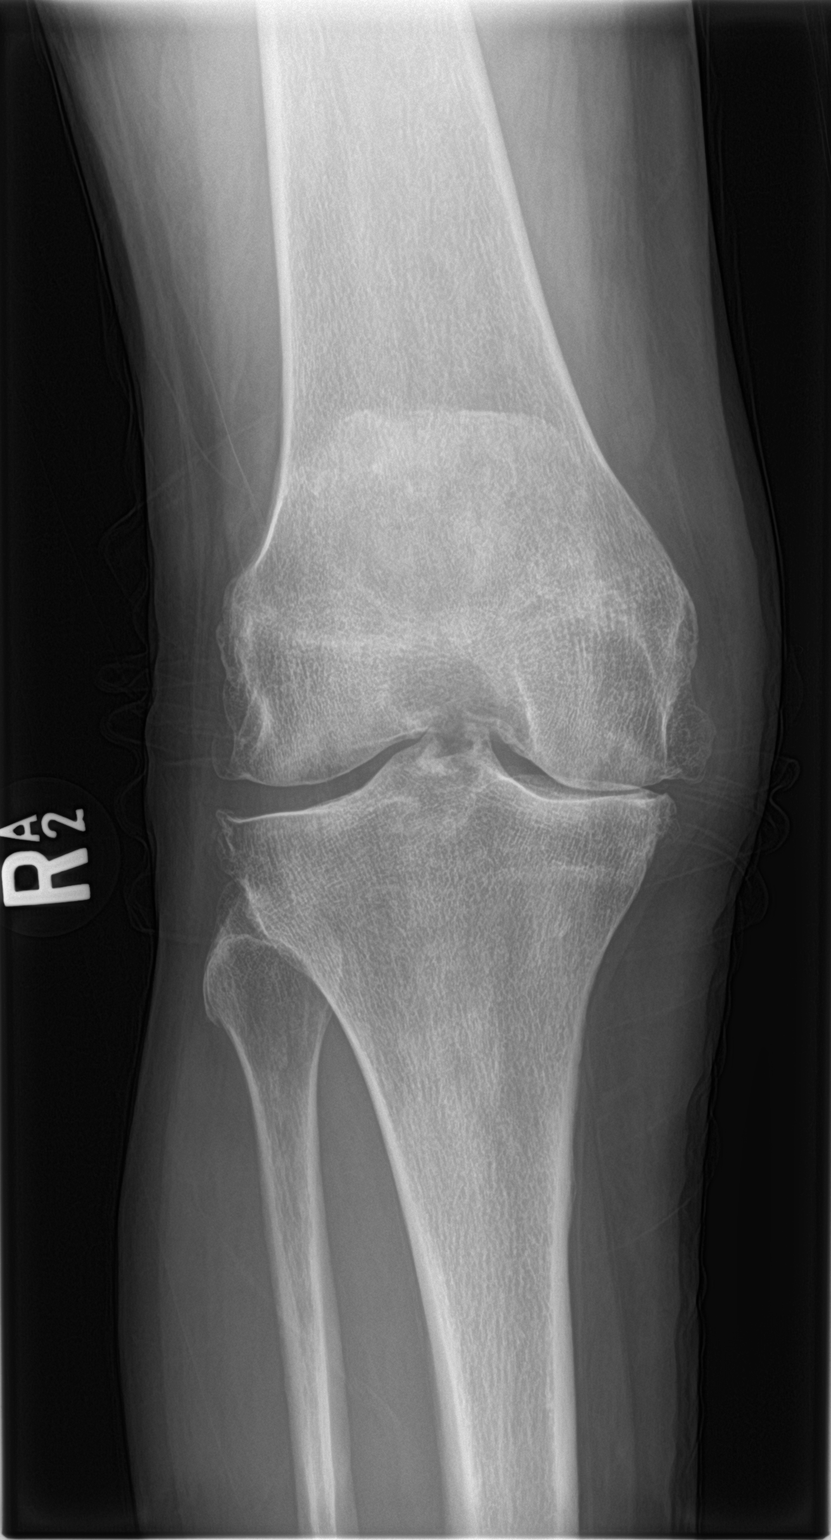
[im 2/2]
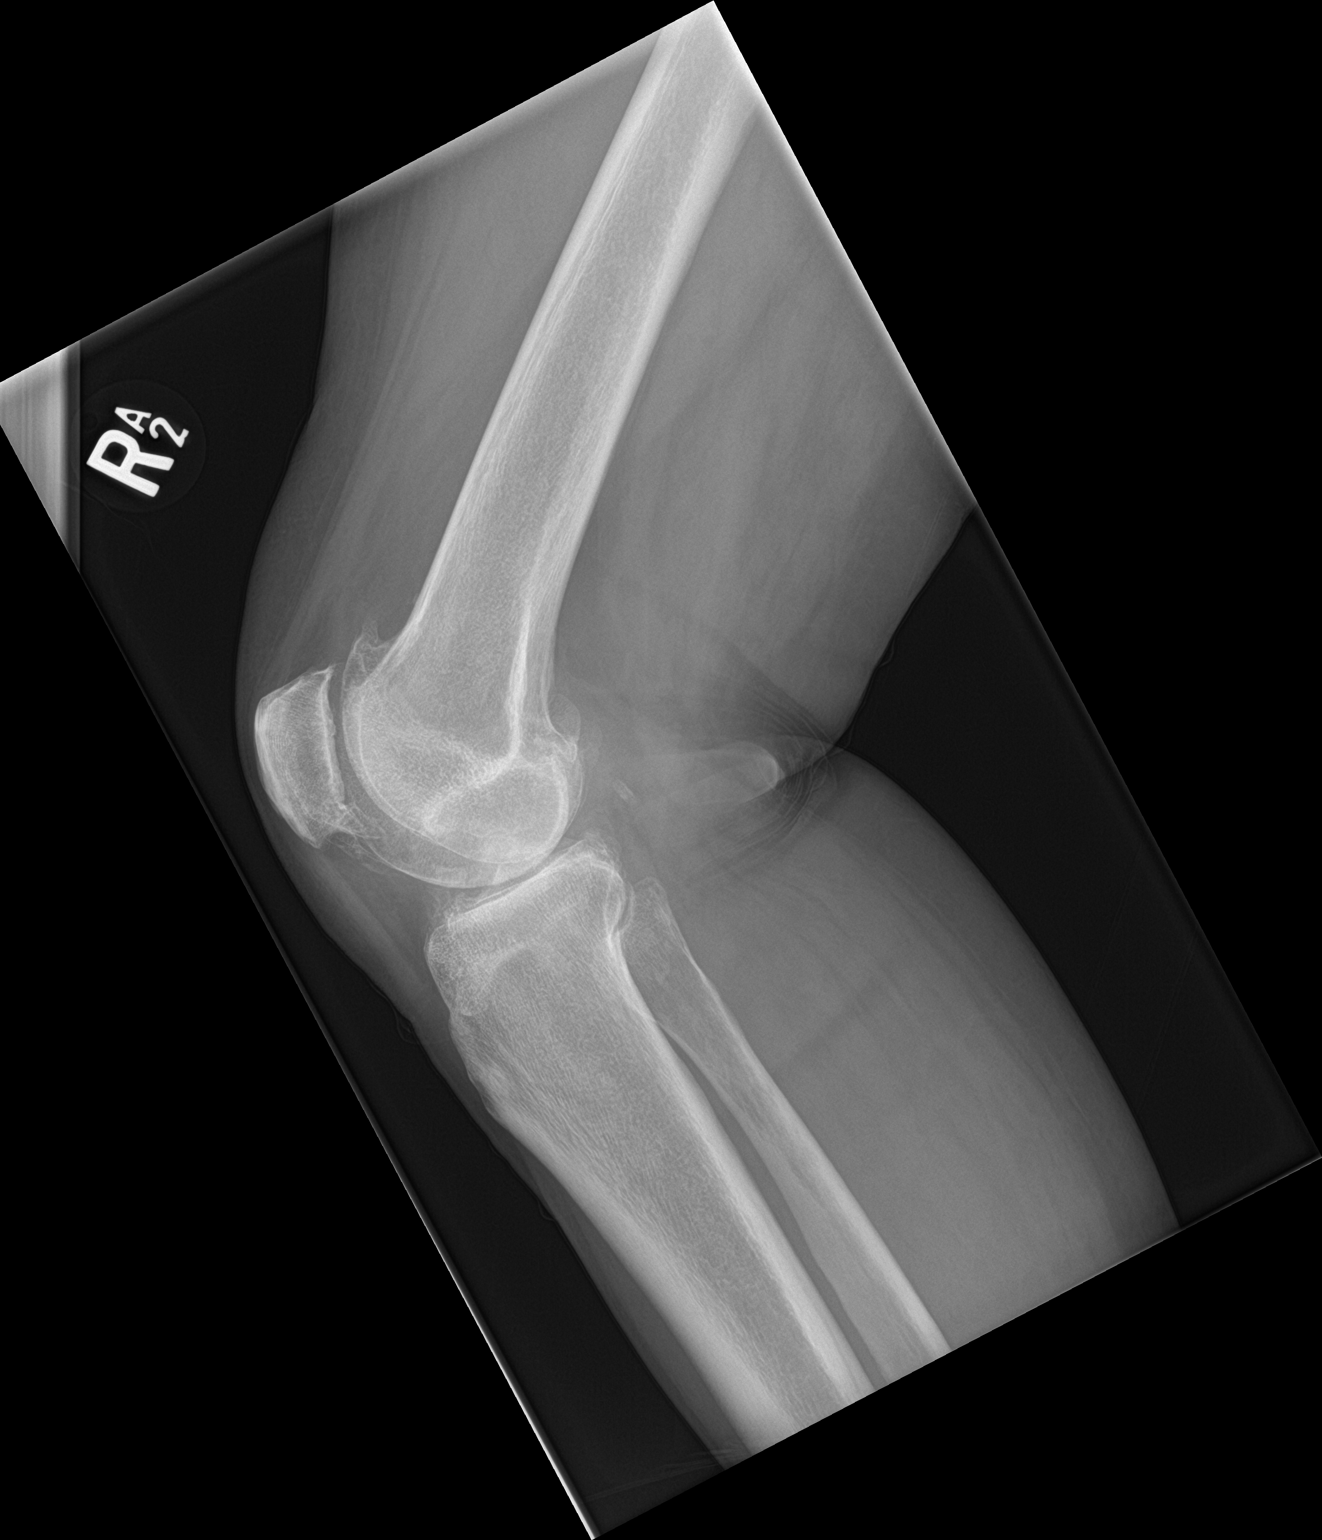

[2 of 2 positions shown; findings below may reference images not displayed]

FINDINGS: Marked medial joint space narrowing and moderate-to-marked medial
spur formation. Mild to moderate lateral spur formation. Moderate to
marked patellofemoral spur formation. No effusion.
IMPRESSION: Moderate-to-marked tricompartmental degenerative changes.

## 2017-03-07 IMAGING — CR DG HIP (WITH OR WITHOUT PELVIS) 2-3V*R*
1 series · 3 of 3 positions shown · non-contrast
Comparison: Left hip radiographs obtained today.

CLINICAL DATA: Chronic bilateral hip pain.

EXAM:
DG HIP (WITH OR WITHOUT PELVIS) 2-3V RIGHT

[Series 1: dg hip unilat w or w/o pelvis 2-3 views  · non-contrast · 0.14mm/px · 3 of 3 slices shown]
[im 1/3]
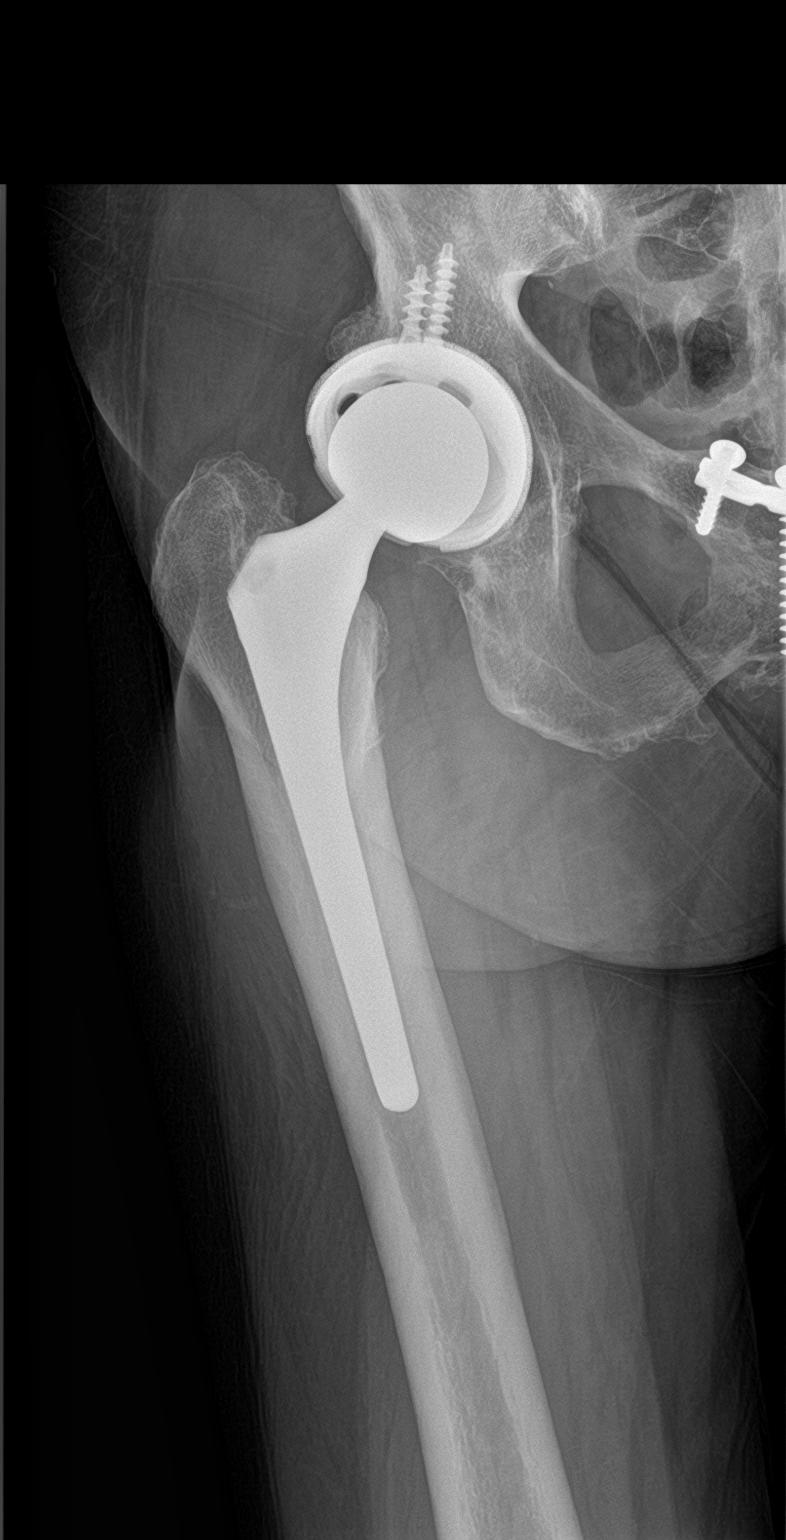
[im 2/3]
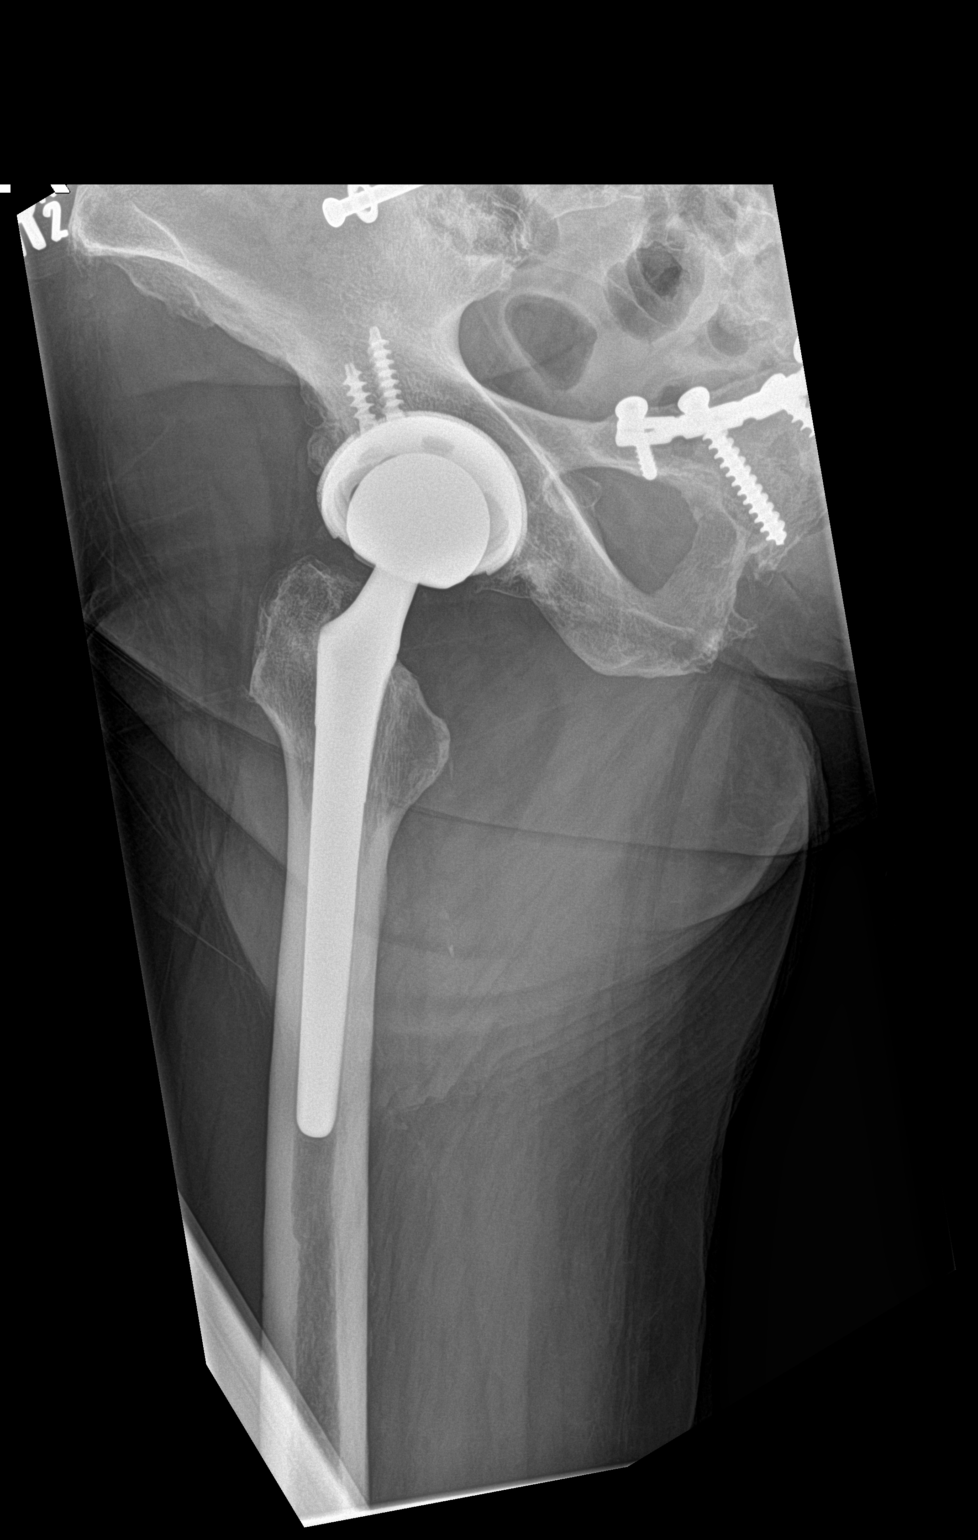
[im 3/3]
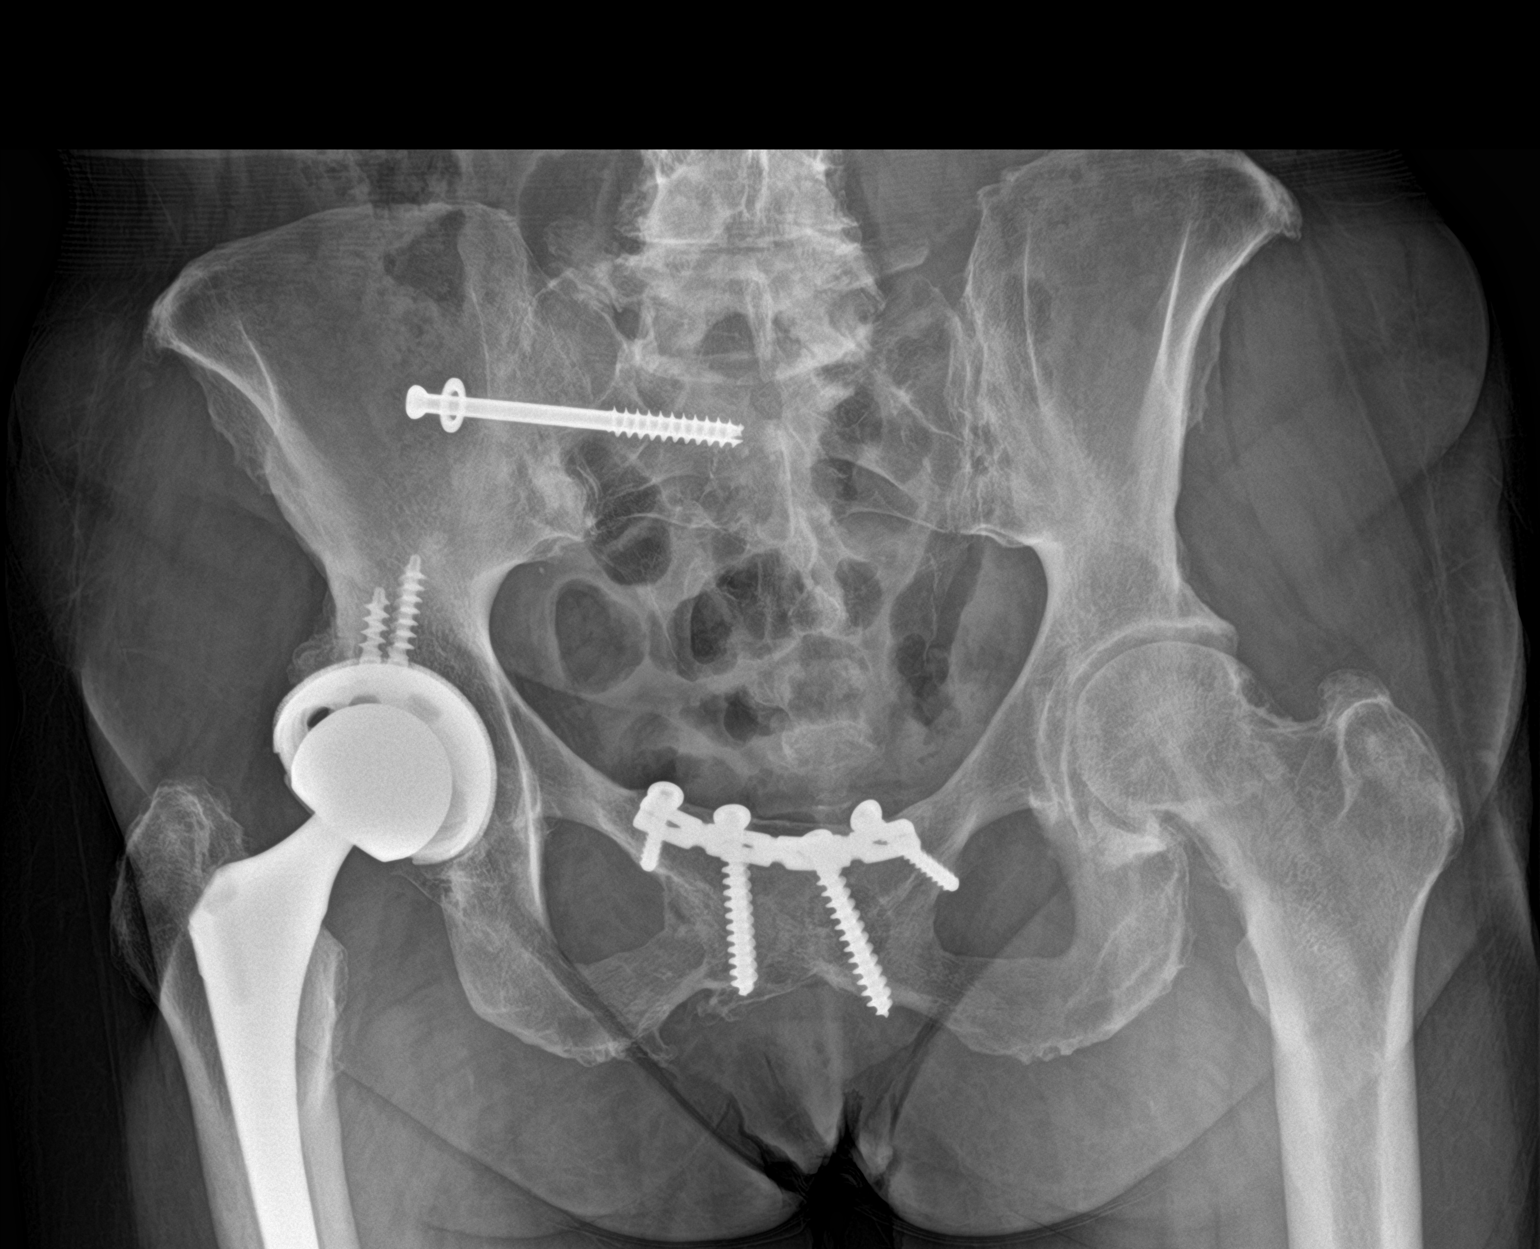

[3 of 3 positions shown; findings below may reference images not displayed]

FINDINGS: Right total hip prosthesis in satisfactory position and alignment.
No evidence of prosthetic loosening. Previously described pelvic
fixation hardware, fusion of the symphysis pubis and lower lumbar
spine degenerative changes.
IMPRESSION: Right hip prosthesis with no complicating features.

## 2017-03-07 IMAGING — CR DG CERVICAL SPINE COMPLETE 4+V
1 series · 5 of 5 positions shown · non-contrast
Comparison: None.

CLINICAL DATA: Chronic neck pain.

EXAM:
CERVICAL SPINE - COMPLETE 4+ VIEW

[Series 1: dg cervical spine complete · 0.14mm/px · 5 of 5 slices shown]
[im 1/5]
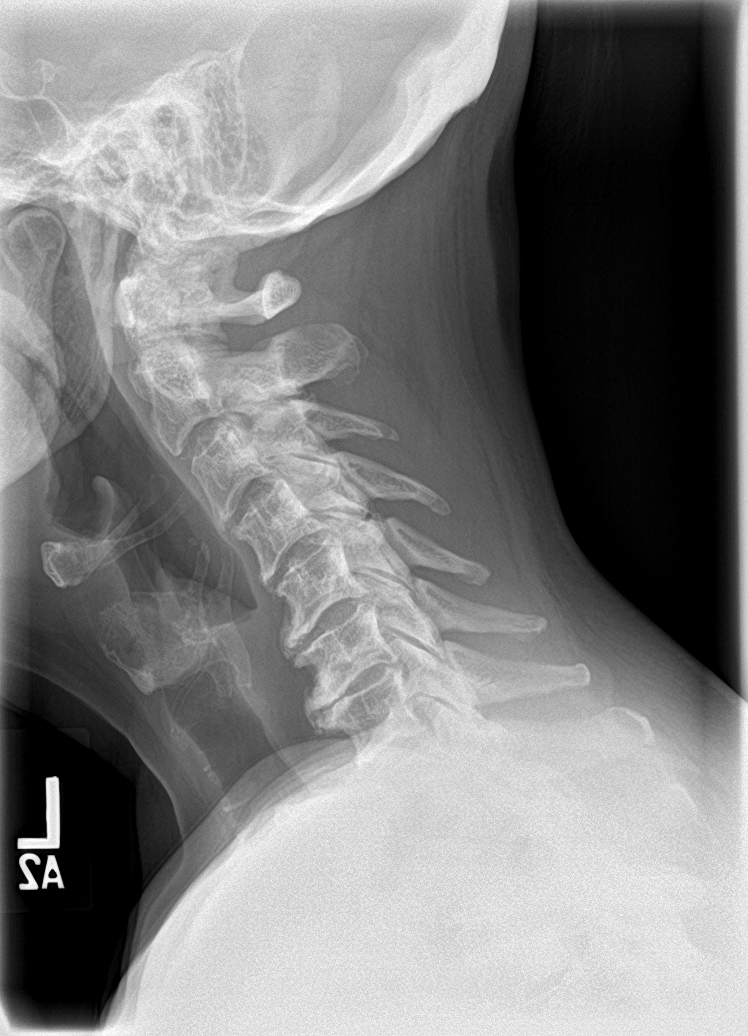
[im 2/5]
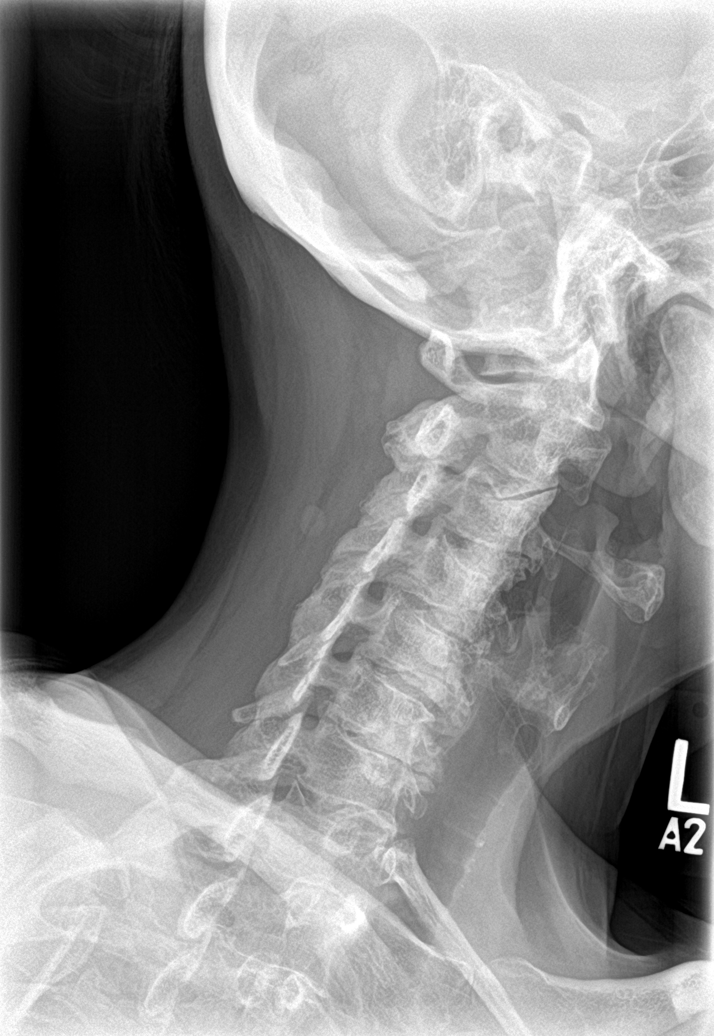
[im 3/5]
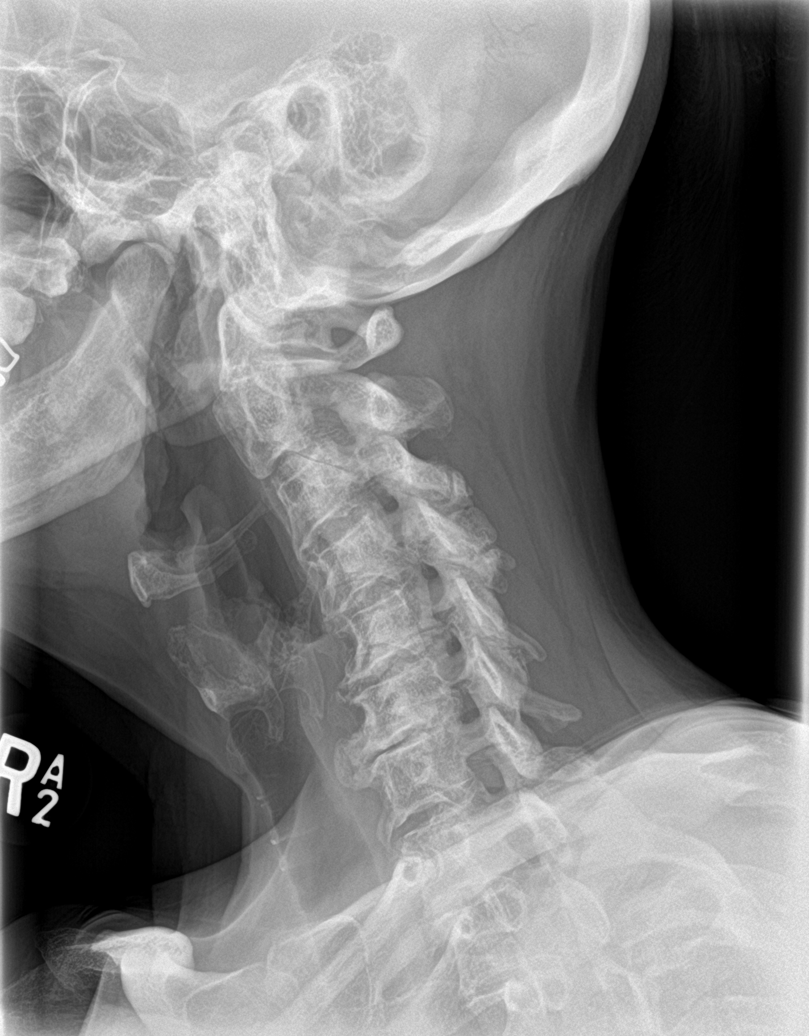
[im 4/5]
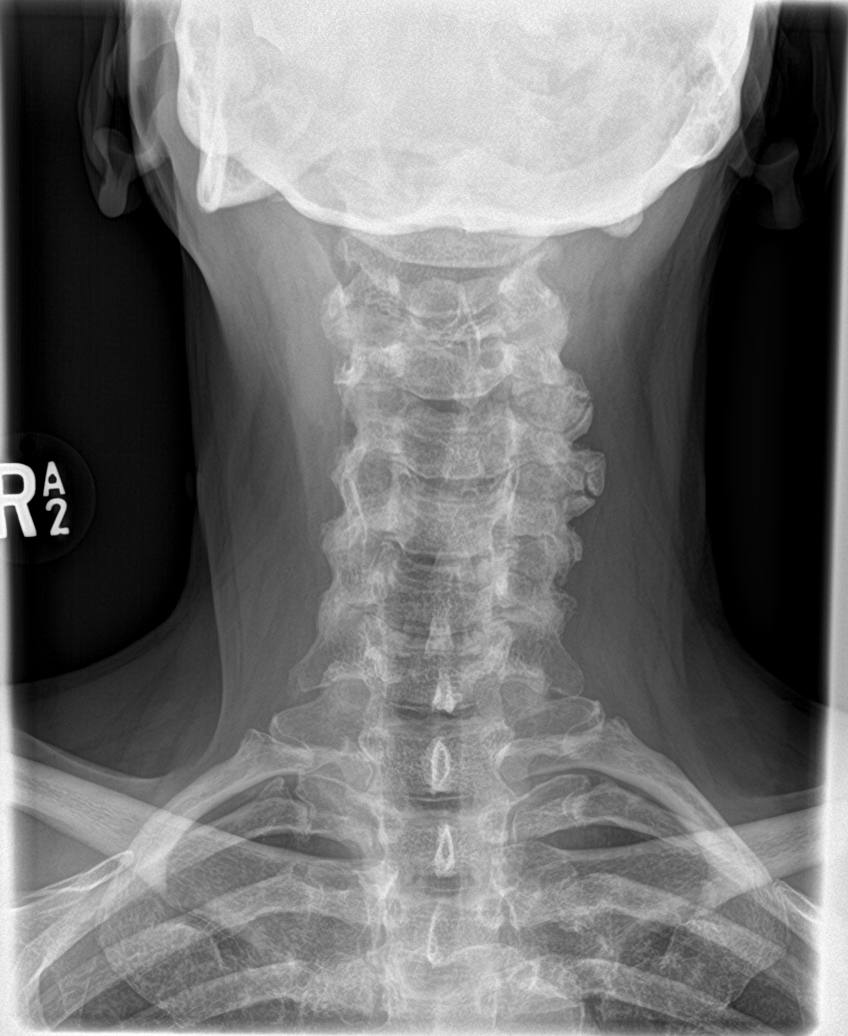
[im 5/5]
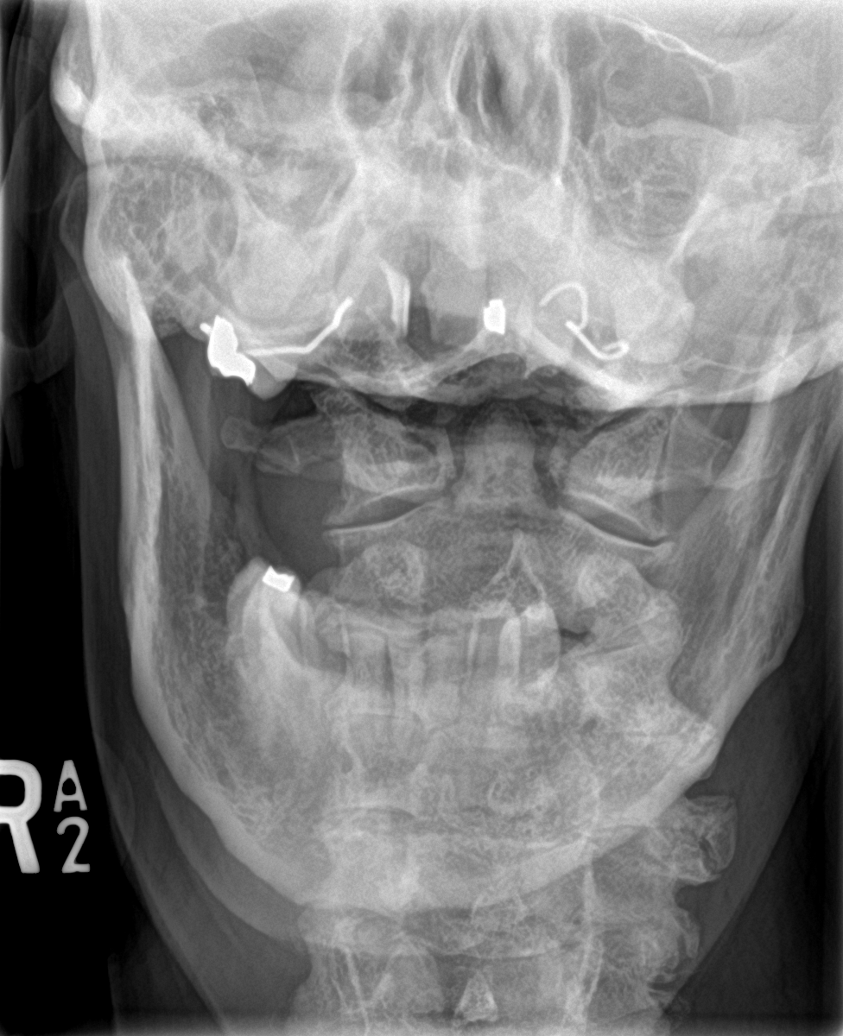

[5 of 5 positions shown; findings below may reference images not displayed]

FINDINGS: Reversal of the normal cervical lordosis. 2 mm of anterolisthesis at
the C3-4 level.

Mild to moderate anterior spur formation at the C3-4 level and at
the C4-5 level with mild posterior spur formation at the C4-5 level.
Moderate anterior spur formation at the C5-6 and C6-7 levels with
mild posterior spur formation at those levels.

Mild facet hypertrophy throughout the cervical spine, most
pronounced at the C2-3 and C3-4 levels. These are combining with
uncinate spurs produce moderate foraminal stenosis on the right at
the C2-3, C3-4, C4-5 and C6-7 levels and mild foraminal stenosis on
the right at the C5-6 and C7-T1 levels.

Moderate foraminal stenosis is noted on the left at the C3-4, C4-5,
C5-6 and C6-7 levels. Minimal foraminal stenosis on the left at the
C2-3 level.

Also noted are large left lateral spurs at the C3-4 and C4-5 levels.
IMPRESSION: 1. Extensive degenerative changes, as described above.
2. Reversal of the normal cervical lordosis.
3. 2 mm of grade 1 anterolisthesis at the C3-4 level.

## 2017-03-10 LAB — 25-HYDROXY VITAMIN D LCMS D2+D3
25-Hydroxy, Vitamin D-2: 7.1 ng/mL
25-Hydroxy, Vitamin D-3: 24 ng/mL
25-Hydroxy, Vitamin D: 31 ng/mL

## 2017-03-10 LAB — COMP. METABOLIC PANEL (12)
A/G RATIO: 2 (ref 1.2–2.2)
ALBUMIN: 4.5 g/dL (ref 3.5–5.5)
AST: 21 IU/L (ref 0–40)
Alkaline Phosphatase: 86 IU/L (ref 39–117)
BUN / CREAT RATIO: 15 (ref 9–23)
BUN: 12 mg/dL (ref 6–24)
Bilirubin Total: <0.2 mg/dL (ref 0.0–1.2)
CALCIUM: 9.4 mg/dL (ref 8.7–10.2)
Chloride: 98 mmol/L (ref 96–106)
Creatinine, Ser: 0.82 mg/dL (ref 0.57–1.00)
GFR, EST AFRICAN AMERICAN: 92 mL/min/{1.73_m2} (ref >59)
GFR, EST NON AFRICAN AMERICAN: 80 mL/min/{1.73_m2} (ref >59)
GLOBULIN, TOTAL: 2.3 g/dL (ref 1.5–4.5)
GLUCOSE: 88 mg/dL (ref 65–99)
POTASSIUM: 3.9 mmol/L (ref 3.5–5.2)
SODIUM: 141 mmol/L (ref 134–144)
TOTAL PROTEIN: 6.8 g/dL (ref 6.0–8.5)

## 2017-03-10 LAB — SEDIMENTATION RATE: Sed Rate: 11 mm/hr (ref 0–40)

## 2017-03-10 LAB — MAGNESIUM: Magnesium: 1.9 mg/dL (ref 1.6–2.3)

## 2017-03-10 LAB — C-REACTIVE PROTEIN: CRP: 1.3 mg/L (ref 0.0–4.9)

## 2017-03-10 LAB — VITAMIN B12: VITAMIN B 12: 457 pg/mL (ref 232–1245)

## 2017-03-11 NOTE — Progress Notes (Signed)
Results were reviewed and found to be: abnormal  No acute injury or pathology identified  Review would suggest interventional pain management techniques may be of benefit

## 2017-03-11 NOTE — Progress Notes (Signed)
  No acute injury or pathology identified

## 2017-03-11 NOTE — Progress Notes (Signed)
Results were reviewed and found to be: mildly abnormal  No acute injury or pathology identified  Review would suggest interventional pain management techniques may be of benefit 

## 2017-03-12 LAB — COMPLIANCE DRUG ANALYSIS, UR

## 2017-03-18 ENCOUNTER — Other Ambulatory Visit: Payer: Self-pay

## 2017-03-19 NOTE — Progress Notes (Addendum)
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 Lumbar facet osteoarthritis (Bilateral), Failed back surgical syndrome, Osteoarthritis, Chronic pain syndrome, Pharmacologic therapy, Problems influencing health status, Neurogenic pain, and Chronic anticoagulation (Plavix) were also pertinent to this visit.  Visit Diagnosis: 1. Chronic low back pain (Primary Area of Pain) (Bilateral) (R>L)   2. Chronic lower extremity pain (Right)   3. Chronic knee pain (Tertiary Area of Pain) (Bilateral) (L>R)   4. Chronic hip pain (Tertiary Area of Pain) (Bilateral) (R>L)   5. 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 Chronic neck pain (Fourth Area of Pain) (Bilateral)  (L>R)   7. Chronic lumbar radicular pain (S1) (Right)   8. Lumbar facet syndrome (Bilateral) (R>L)   9. Lumbar facet osteoarthritis (Bilateral)   10. Failed back surgical syndrome   11. Osteoarthritis    12. Chronic pain syndrome   13. Pharmacologic therapy   14. Problems influencing health status   15. Neurogenic pain   16. Chronic anticoagulation (Plavix)    Problems updated and reviewed during this visit: Problem  Neurogenic Pain  Chronic lower extremity pain (Secondary Area of Pain) (Right)  Chronic lumbar radicular pain (S1) (Right)  Failed Back Surgical Syndrome  Lumbar facet syndrome (Bilateral) (R>L)  Lumbar facet osteoarthritis (Bilateral)  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)  Chronic low back pain (Primary Area of Pain) (Bilateral) (R>L)  Chronic knee pain (Tertiary Area of Pain) (Bilateral) (L>R)  Chronic hip pain (Fourth Area of Pain) (Bilateral) (R>L)  Chronic neck pain (Fifth Area of Pain) (Bilateral)  (L>R)  Osteoarthritis  Chronic anticoagulation (Plavix)   PLAVIX ANTICOAGULATION!!! 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 Harris, was spent in counseling/coordination of care regarding: the appropriate use of the pain scale, Ms. Berkey's primary cause of pain, the results of her recent test(s), the significance of each one oth the test(s) anomalies and it's corresponding characteristic pain pattern(s), the treatment plan, treatment alternatives, the risks and possible complications of proposed treatment, medication side effects, realistic expectations, the goals of pain management (increased in functionality) and the need to collect and read the AVS material.  Plan of Care  Pharmacotherapy (Medications Ordered): Meds ordered this encounter  Medications  . gabapentin (NEURONTIN) 100 MG capsule    Sig: Take 1-3 capsules (100-300 mg total) 3 (three) times daily by mouth. Follow written titration schedule.    Dispense:  90 capsule    Refill:  0    Do not place medication on  "Automatic Refill". Fill one day early if pharmacy is closed on scheduled refill date.    Procedure Orders     LUMBAR FACET(MEDIAL BRANCH NERVE BLOCK) MBNB Lab Orders  No laboratory test(s) ordered today   Imaging Orders  No imaging studies ordered today   Referral Orders  No referral(s) requested today    Pharmacological management options:  Opioid Analgesics: We'll take over management today. See above orders Membrane stabilizer: We have discussed the possibility of optimizing this mode of therapy, if tolerated Muscle relaxant: We have discussed the possibility of a trial NSAID: We have discussed the possibility of a trial Other analgesic(s): To be determined at a later time   Interventional management options: Planned, scheduled, and/or pending:    NOTE: Stop Plavix x 7 days prior to procedures. Diagnostic  bilateral lumbar facet block under fluoroscopic guidance and IV sedation   Considering:   Diagnostic bilateral lumbar facet block  Possible bilateral lumbar facet RFA  Diagnostic right-sided L5-S1 lumbar epidural steroid injection  Diagnostic right-sided caudal epidural steroid injection + diagnostic epidurogram  Possible right sided Racz procedure  Possible bilateral lumbar spinal cord stimulator trial  Diagnostic bilateral cervical facet block  Possible bilateral cervical facet RFA  Diagnostic left cervical epidural steroid injection  Diagnostic left intra-articular hip joint injection  Diagnostic bilateral femoral nerve + obturator nerve block  Possible bilateral femoral nerve + obturator nerve RFA  Diagnostic bilateral intra-articular knee joint injection with local anesthetic and steroid  Possible bilateral series of 5 intra-articular Hyalgan knee injections  Diagnostic bilateral genicular nerve block  Possible bilateral genicular nerve RFA    PRN Procedures:   None at this time   Provider-requested follow-up: Return for Procedure (w/ sedation): (B) L-FCT  BLK, (Blood-thinner Protocol).  No future appointments.  Primary Care Physician: Jessica Ranch, MD Location: [facility] Note by: Gaspar Cola, MD Date: 03/20/2017; Time: 12:09 PM

## 2017-03-20 ENCOUNTER — Encounter: Payer: Self-pay | Admitting: Pain Medicine

## 2017-03-20 ENCOUNTER — Ambulatory Visit: Payer: Medicare Other | Attending: Pain Medicine | Admitting: Pain Medicine

## 2017-03-20 ENCOUNTER — Telehealth: Payer: Self-pay | Admitting: Pain Medicine

## 2017-03-20 VITALS — BP 125/85 | HR 90 | Temp 97.8°F | Resp 16 | Ht 63.0 in | Wt 145.0 lb

## 2017-03-20 DIAGNOSIS — Z7902 Long term (current) use of antithrombotics/antiplatelets: Secondary | ICD-10-CM | POA: Insufficient documentation

## 2017-03-20 DIAGNOSIS — J45909 Unspecified asthma, uncomplicated: Secondary | ICD-10-CM | POA: Insufficient documentation

## 2017-03-20 DIAGNOSIS — G8929 Other chronic pain: Secondary | ICD-10-CM

## 2017-03-20 DIAGNOSIS — M5441 Lumbago with sciatica, right side: Secondary | ICD-10-CM

## 2017-03-20 DIAGNOSIS — M170 Bilateral primary osteoarthritis of knee: Secondary | ICD-10-CM | POA: Insufficient documentation

## 2017-03-20 DIAGNOSIS — M792 Neuralgia and neuritis, unspecified: Secondary | ICD-10-CM

## 2017-03-20 DIAGNOSIS — Z7901 Long term (current) use of anticoagulants: Secondary | ICD-10-CM

## 2017-03-20 DIAGNOSIS — M4312 Spondylolisthesis, cervical region: Secondary | ICD-10-CM | POA: Insufficient documentation

## 2017-03-20 DIAGNOSIS — I252 Old myocardial infarction: Secondary | ICD-10-CM | POA: Insufficient documentation

## 2017-03-20 DIAGNOSIS — M79604 Pain in right leg: Secondary | ICD-10-CM | POA: Diagnosis not present

## 2017-03-20 DIAGNOSIS — M25561 Pain in right knee: Secondary | ICD-10-CM | POA: Diagnosis not present

## 2017-03-20 DIAGNOSIS — Z79899 Other long term (current) drug therapy: Secondary | ICD-10-CM | POA: Diagnosis not present

## 2017-03-20 DIAGNOSIS — F4310 Post-traumatic stress disorder, unspecified: Secondary | ICD-10-CM | POA: Diagnosis not present

## 2017-03-20 DIAGNOSIS — M25562 Pain in left knee: Secondary | ICD-10-CM

## 2017-03-20 DIAGNOSIS — M961 Postlaminectomy syndrome, not elsewhere classified: Secondary | ICD-10-CM | POA: Diagnosis not present

## 2017-03-20 DIAGNOSIS — M4722 Other spondylosis with radiculopathy, cervical region: Secondary | ICD-10-CM | POA: Insufficient documentation

## 2017-03-20 DIAGNOSIS — I2510 Atherosclerotic heart disease of native coronary artery without angina pectoris: Secondary | ICD-10-CM | POA: Insufficient documentation

## 2017-03-20 DIAGNOSIS — M153 Secondary multiple arthritis: Secondary | ICD-10-CM

## 2017-03-20 DIAGNOSIS — G894 Chronic pain syndrome: Secondary | ICD-10-CM

## 2017-03-20 DIAGNOSIS — I429 Cardiomyopathy, unspecified: Secondary | ICD-10-CM | POA: Diagnosis not present

## 2017-03-20 DIAGNOSIS — Z955 Presence of coronary angioplasty implant and graft: Secondary | ICD-10-CM | POA: Diagnosis not present

## 2017-03-20 DIAGNOSIS — E876 Hypokalemia: Secondary | ICD-10-CM | POA: Insufficient documentation

## 2017-03-20 DIAGNOSIS — E785 Hyperlipidemia, unspecified: Secondary | ICD-10-CM | POA: Insufficient documentation

## 2017-03-20 DIAGNOSIS — M5416 Radiculopathy, lumbar region: Secondary | ICD-10-CM

## 2017-03-20 DIAGNOSIS — M25552 Pain in left hip: Secondary | ICD-10-CM | POA: Diagnosis not present

## 2017-03-20 DIAGNOSIS — M545 Low back pain: Secondary | ICD-10-CM | POA: Insufficient documentation

## 2017-03-20 DIAGNOSIS — F17210 Nicotine dependence, cigarettes, uncomplicated: Secondary | ICD-10-CM | POA: Insufficient documentation

## 2017-03-20 DIAGNOSIS — M47816 Spondylosis without myelopathy or radiculopathy, lumbar region: Secondary | ICD-10-CM | POA: Diagnosis not present

## 2017-03-20 DIAGNOSIS — M542 Cervicalgia: Secondary | ICD-10-CM

## 2017-03-20 DIAGNOSIS — M25551 Pain in right hip: Secondary | ICD-10-CM | POA: Diagnosis not present

## 2017-03-20 DIAGNOSIS — F411 Generalized anxiety disorder: Secondary | ICD-10-CM | POA: Insufficient documentation

## 2017-03-20 DIAGNOSIS — I110 Hypertensive heart disease with heart failure: Secondary | ICD-10-CM | POA: Diagnosis not present

## 2017-03-20 DIAGNOSIS — F419 Anxiety disorder, unspecified: Secondary | ICD-10-CM | POA: Insufficient documentation

## 2017-03-20 DIAGNOSIS — Z789 Other specified health status: Secondary | ICD-10-CM | POA: Diagnosis not present

## 2017-03-20 DIAGNOSIS — Z96651 Presence of right artificial knee joint: Secondary | ICD-10-CM | POA: Insufficient documentation

## 2017-03-20 DIAGNOSIS — Z96641 Presence of right artificial hip joint: Secondary | ICD-10-CM

## 2017-03-20 DIAGNOSIS — F329 Major depressive disorder, single episode, unspecified: Secondary | ICD-10-CM | POA: Insufficient documentation

## 2017-03-20 MED ORDER — GABAPENTIN 100 MG PO CAPS: 100.0000 mg | ORAL_CAPSULE | Freq: Three times a day (TID) | ORAL | 0 refills | Status: DC

## 2017-03-20 NOTE — Progress Notes (Signed)
Safety precautions to be maintained throughout the outpatient stay will include: orient to surroundings, keep bed in low position, maintain call bell within reach at all times, provide assistance with transfer out of bed and ambulation.  

## 2017-03-20 NOTE — Telephone Encounter (Signed)
Patient called stating she received script for Neurontin 100mg . She is already taking gabapentin 300mg . Please call, she did not realize they were the same medicine.

## 2017-03-20 NOTE — Telephone Encounter (Signed)
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  This was told to her by her PCP.  Also, patient had tried this medication previously and it upset her belly.  She had also tried Lyrica and that did not work for her either.  She just wanted us to know that she is not going to take this medication.

## 2017-03-20 NOTE — Patient Instructions (Addendum)
____________________________________________________________________________________________  Initial Gabapentin Titration  Medication used: Gabapentin (Generic Name) or Neurontin (Brand Name) 100 mg tablets/capsules  Reasons to stop increasing the dose:  Reason 1: You get good relief of symptoms, in which case there is no need to increase the daily dose any further.    Reason 2: You develop some side effects, such as sleeping all of the time, difficulty concentrating, or becoming disoriented, in which case you need to go down on the dose, to the prior level, where you were not experiencing any side effects. Stay on that dose longer, to allow more time for your body to get use it, before attempting to increase it again.   Steps: Step 1: Start by taking 1 (one) tablet at bedtime x 7 (seven) days.  Step 2: After being on 1 (one) tablet for 7 (seven) days, then increase it to 2 (two) tablets at bedtime for another 7 (seven) days.  Step 3: Next, after being on 2 (two) tablets at bedtime for 7 (seven) days, then increase it to 3 (three) tablets at bedtime, and stay on that dose until you see your doctor.  Reasons to stop increasing the dose: Reason 1: You get good relief of symptoms, in which case there is no need to increase the daily dose any further.  Reason 2: You develop some side effects, such as sleeping all of the time, difficulty concentrating, or becoming disoriented, in which case you need to go down on the dose, to the prior level, where you were not experiencing any side effects. Stay on that dose longer, to allow more time for your body to get use it, before attempting to increase it again.  Endpoint: Once you have reached the maximum dose you can tolerate without side-effects, contact your physician so as to evaluate the results of the regimen.   Questions: Feel free to contact us for any questions or problems at [phone]  [phone] ____________________________________________________________________________________________ ____________________________________________________________________________________________  Pain Scale  Introduction: The pain score used by this practice is the Verbal Numerical Rating Scale (VNRS-11). This is an 11-point scale. It is for adults and children 10 years or older. There are significant differences in how the pain score is reported, used, and applied. Forget everything you learned in the past and learn this scoring system.  General Information: The scale should reflect your current level of pain. Unless you are specifically asked for the level of your worst pain, or your average pain. If you are asked for one of these two, then it should be understood that it is over the past 24 hours.  Basic Activities of Daily Living (ADL): Personal hygiene, dressing, eating, transferring, and using restroom.  Instructions: Most patients tend to report their level of pain as a combination of two factors, their physical pain and their psychosocial pain. This last one is also known as "suffering" and it is reflection of how physical pain affects you socially and psychologically. From now on, report them separately. From this point on, when asked to report your pain level, report only your physical pain. Use the following table for reference.  Pain Clinic Pain Levels (0-5/10)  Pain Level Score  Description  No Pain 0   Mild pain 1 Nagging, annoying, but does not interfere with basic activities of daily living (ADL). Patients are able to eat, bathe, get dressed, toileting (being able to get on and off the toilet and perform personal hygiene functions), transfer (move in and out of bed or a chair without assistance),  and maintain continence (able to control bladder and bowel functions). Blood pressure and heart rate are unaffected. A normal heart rate for a healthy adult ranges from 60 to 100 bpm (beats  per minute).   Mild to moderate pain 2 Noticeable and distracting. Impossible to hide from other people. More frequent flare-ups. Still possible to adapt and function close to normal. It can be very annoying and may have occasional stronger flare-ups. With discipline, patients may get used to it and adapt.   Moderate pain 3 Interferes significantly with activities of daily living (ADL). It becomes difficult to feed, bathe, get dressed, get on and off the toilet or to perform personal hygiene functions. Difficult to get in and out of bed or a chair without assistance. Very distracting. With effort, it can be ignored when deeply involved in activities.   Moderately severe pain 4 Impossible to ignore for more than a few minutes. With effort, patients may still be able to manage work or participate in some social activities. Very difficult to concentrate. Signs of autonomic nervous system discharge are evident: dilated pupils (mydriasis); mild sweating (diaphoresis); sleep interference. Heart rate becomes elevated (>115 bpm). Diastolic blood pressure (lower number) rises above 100 mmHg. Patients find relief in laying down and not moving.   Severe pain 5 Intense and extremely unpleasant. Associated with frowning face and frequent crying. Pain overwhelms the senses.  Ability to do any activity or maintain social relationships becomes significantly limited. Conversation becomes difficult. Pacing back and forth is common, as getting into a comfortable position is nearly impossible. Pain wakes you up from deep sleep. Physical signs will be obvious: pupillary dilation; increased sweating; goosebumps; brisk reflexes; cold, clammy hands and feet; nausea, vomiting or dry heaves; loss of appetite; significant sleep disturbance with inability to fall asleep or to remain asleep. When persistent, significant weight loss is observed due to the complete loss of appetite and sleep deprivation.  Blood pressure and heart rate  becomes significantly elevated. Caution: If elevated blood pressure triggers a pounding headache associated with blurred vision, then the patient should immediately seek attention at an urgent or emergency care unit, as these may be signs of an impending stroke.    Emergency Department Pain Levels (6-10/10)  Emergency Room Pain 6 Severely limiting. Requires emergency care and should not be seen or managed at an outpatient pain management facility. Communication becomes difficult and requires great effort. Assistance to reach the emergency department may be required. Facial flushing and profuse sweating along with potentially dangerous increases in heart rate and blood pressure will be evident.   Distressing pain 7 Self-care is very difficult. Assistance is required to transport, or use restroom. Assistance to reach the emergency department will be required. Tasks requiring coordination, such as bathing and getting dressed become very difficult.   Disabling pain 8 Self-care is no longer possible. At this level, pain is disabling. The individual is unable to do even the most "basic" activities such as walking, eating, bathing, dressing, transferring to a bed, or toileting. Fine motor skills are lost. It is difficult to think clearly.   Incapacitating pain 9 Pain becomes incapacitating. Thought processing is no longer possible. Difficult to remember your own name. Control of movement and coordination are lost.   The worst pain imaginable 10 At this level, most patients pass out from pain. When this level is reached, collapse of the autonomic nervous system occurs, leading to a sudden drop in blood pressure and heart rate. This in turn results  in a temporary and dramatic drop in blood flow to the brain, leading to a loss of consciousness. Fainting is one of the body's self defense mechanisms. Passing out puts the brain in a calmed state and causes it to shut down for a while, in order to begin the healing  process.    Summary: 1. Refer to this scale when providing [place] with your pain level. 2. Be accurate and careful when reporting your pain level. This will help with your care. 3. Over-reporting your pain level will lead to loss of credibility. 4. Even a level of 1/10 means that there is pain and will be treated at our facility. 5. High, inaccurate reporting will be documented as "Symptom Exaggeration", leading to loss of credibility and suspicions of possible secondary gains such as obtaining more narcotics, or wanting to appear disabled, for fraudulent reasons. 6. Only pain levels of 5 or below will be seen at our facility. 7. Pain levels of 6 and above will be sent to the Emergency Department and the appointment cancelled. ____________________________________________________________________________________________   ____________________________________________________________________________________________  Appointment Policy Summary  It is our goal and responsibility to provide the medical community with assistance in the evaluation and management of patients with chronic pain. Unfortunately our resources are limited. Because we do not have an unlimited amount of time, or available appointments, we are required to closely monitor and manage their use. The following rules exist to maximize their use:  Patient's responsibilities: 1. Punctuality:  At what time should I arrive? You should be physically present in our office 30 minutes before your scheduled appointment. Your scheduled appointment is with your assigned healthcare provider. However, it takes 5-10 minutes to be "checked-in", and another 15 minutes for the nurses to do the admission. If you arrive to our office at the time you were given for your appointment, you will end up being at least 20-25 minutes late to your appointment with the provider. 2. Tardiness:  What happens if I arrive only a few minutes after my scheduled  appointment time? You will need to reschedule your appointment. The cutoff is your appointment time. This is why it is so important that you arrive at least 30 minutes before that appointment. If you have an appointment scheduled for 10:00 AM and you arrive at 10:01, you will be required to reschedule your appointment.  3. Plan ahead:  Always assume that you will encounter traffic on your way in. Plan for it. If you are dependent on a driver, make sure they understand these rules and the need to arrive early. 4. Other appointments and responsibilities:  Avoid scheduling any other appointments before or after your pain clinic appointments.  5. Be prepared:  Write down everything that you need to discuss with your healthcare provider and give this information to the admitting nurse. Write down the medications that you will need refilled. Bring your pills and bottles (even the empty ones), to all of your appointments, except for those where a procedure is scheduled. 6. No children or pets:  Find someone to take care of them. It is not appropriate to bring them in. 7. Scheduling changes:  We request "advanced notification" of any changes or cancellations. 8. Advanced notification:  Defined as a time period of more than 24 hours prior to the originally scheduled appointment. This allows for the appointment to be offered to other patients. 9. Rescheduling:  When a visit is rescheduled, it will require the cancellation of the original appointment. For this reason  they both fall within the category of "Cancellations".  10. Cancellations:  They require advanced notification. Any cancellation less than 24 hours before the  appointment will be recorded as a "No Show". 11. No Show:  Defined as an unkept appointment where the patient failed to notify or declare to the practice their intention or inability to keep the appointment.  Corrective process for repeat offenders:  1. Tardiness: Three (3) episodes of  rescheduling due to late arrivals will be recorded as one (1) "No Show". 2. Cancellation or reschedule: Three (3) cancellations or rescheduling will be recorded as one (1) "No Show". 3. "No Shows": Three (3) "No Shows" within a 12 month period will result in discharge from the practice.  ____________________________________________________________________________________________  Pain Management Discharge Instructions  General Discharge Instructions :  If you need to reach your doctor call: Monday-Friday 8:00 am - 4:00 pm at [phone] or toll free [phone].  After clinic hours [phone] to have operator reach doctor.  Bring all of your medication bottles to all your appointments in the pain clinic.  To cancel or reschedule your appointment with Pain Management please remember to call 24 hours in advance to avoid a fee.  Refer to the educational materials which you have been given on: General Risks, I had my Procedure. Discharge Instructions, Post Sedation.  Post Procedure Instructions:  The drugs you were given will stay in your system until tomorrow, so for the next 24 hours you should not drive, make any legal decisions or drink any alcoholic beverages.  You may eat anything you prefer, but it is better to start with liquids then soups and crackers, and gradually work up to solid foods.  Please notify your doctor immediately if you have any unusual bleeding, trouble breathing or pain that is not related to your normal pain.  Depending on the type of procedure that was done, some parts of your body may feel week and/or numb.  This usually clears up by tonight or the next day.  Walk with the use of an assistive device or accompanied by an adult for the 24 hours.  You may use ice on the affected area for the first 24 hours.  Put ice in a Ziploc bag and cover with a towel and place against area 15 minutes on 15 minutes off.  You may switch to heat after 24 hours. Facet Joint  Block, Care After Refer to this sheet in the next few weeks. These instructions provide you with information about caring for yourself after your procedure. Your health care provider may also give you more specific instructions. Your treatment has been planned according to current medical practices, but problems sometimes occur. Call your health care provider if you have any problems or questions after your procedure. What can I expect after the procedure? After the procedure, it is common to have:  Some tenderness over the injection sites for 2 days after the procedure.  A temporary increase in blood sugar if you have diabetes.  Follow these instructions at home:  Keep track of the amount of pain relief you feel and how long it lasts.  Take over-the-counter and prescription medicines only as told by your health care provider. You may need to limit pain medicine within the first 4-6 hours after the procedure.  Remove your bandages (dressings) the morning after the procedure.  For the first 24 hours after the procedure: ? Do not apply heat near or over the injection sites. ? Do not take a bath  or soak in water, such as in a pool or lake. ? Do not drive or operate heavy machinery unless approved by your health care provider. ? Avoid activities that require a lot of energy.  If the injection site is tender, try applying ice to the area. To do this: ? Put ice in a plastic bag. ? Place a towel between your skin and the bag. ? Leave the ice on for 20 minutes, 2-3 times a day.  Keep all follow-up visits as told by your health care provider. This is important. Contact a health care provider if:  Fluid is coming from an injection site.  There is significant bleeding or swelling at an injection site.  You have diabetes and your blood sugar is above 180 mg/dL. Get help right away if:  You have a fever.  You have worsening pain or swelling around an injection site.  There are red  streaks around an injection site.  You develop severe pain that is not controlled by your medicines.  You develop a headache, stiff neck, nausea, or vomiting.  Your eyes become very sensitive to light.  You have weakness, paralysis, or tingling in your arms or legs that was not present before the procedure.  You have difficulty urinating or breathing. This information is not intended to replace advice given to you by your health care provider. Make sure you discuss any questions you have with your health care provider. Document Released: 04/16/2012 Document Revised: 09/14/2015 Document Reviewed: 01/24/2015 Elsevier Interactive Patient Education  2018 [employer].  Facet Joint Block The facet joints connect the bones of the spine (vertebrae). They make it possible for you to bend, twist, and make other movements with your spine. They also keep you from bending too far, twisting too far, and making other excessive movements. A facet joint block is a procedure where a numbing medicine (anesthetic) is injected into a facet joint. Often, a type of anti-inflammatory medicine called a steroid is also injected. A facet joint block may be done to diagnose neck or back pain. If the pain gets better after a facet joint block, it means the pain is probably coming from the facet joint. If the pain does not get better, it means the pain is probably not coming from the facet joint. A facet joint block may also be done to relieve neck or back pain caused by an inflamed facet joint. A facet joint block is only done to relieve pain if the pain does not improve with other methods, such as medicine, exercise programs, and physical therapy. Tell a health care provider about:  Any allergies you have.  All medicines you are taking, including vitamins, herbs, eye drops, creams, and over-the-counter medicines.  Any problems you or family members have had with anesthetic medicines.  Any blood disorders you  have.  Any surgeries you have had.  Any medical conditions you have.  Whether you are pregnant or may be pregnant. What are the risks? Generally, this is a safe procedure. However, problems may occur, including:  Bleeding.  Injury to a nerve near the injection site.  Pain at the injection site.  Weakness or numbness in areas controlled by nerves near the injection site.  Infection.  Temporary fluid retention.  Allergic reactions to medicines or dyes.  Injury to other structures or organs near the injection site.  What happens before the procedure?  Follow instructions from your health care provider about eating or drinking restrictions.  Ask your health care  provider about: ? Changing or stopping your regular medicines. This is especially important if you are taking diabetes medicines or blood thinners. ? Taking medicines such as aspirin and ibuprofen. These medicines can thin your blood. Do not take these medicines before your procedure if your health care provider instructs you not to.  Do not take any new dietary supplements or medicines without asking your health care provider first.  Plan to have someone take you home after the procedure. What happens during the procedure?  You may need to remove your clothing and dress in an open-back gown.  The procedure will be done while you are lying on an X-ray table. You will most likely be asked to lie on your stomach, but you may be asked to lie in a different position if an injection will be made in your neck.  Machines will be used to monitor your oxygen levels, heart rate, and blood pressure.  If an injection will be made in your neck, an IV tube will be inserted into one of your veins. Fluids and medicine will flow directly into your body through the IV tube.  The area over the facet joint where the injection will be made will be cleaned with soap. The surrounding skin will be covered with clean drapes.  A numbing  medicine (local anesthetic) will be applied to your skin. Your skin may sting or burn for a moment.  A video X-ray machine (fluoroscopy) will be used to locate the joint. In some cases, a CT scan may be used.  A contrast dye may be injected into the facet joint area to help locate the joint.  When the joint is located, an anesthetic will be injected into the joint through the needle.  Your health care provider will ask you whether you feel pain relief. If you do feel relief, a steroid may be injected to provide pain relief for a longer period of time. If you do not feel relief or feel only partial relief, additional injections of an anesthetic may be made in other facet joints.  The needle will be removed.  Your skin will be cleaned.  A bandage (dressing) will be applied over each injection site. The procedure may vary among health care providers and hospitals. What happens after the procedure?  You will be observed for 15-30 minutes before being allowed to go home. This information is not intended to replace advice given to you by your health care provider. Make sure you discuss any questions you have with your health care provider. Document Released: 09/19/2006 Document Revised: 06/01/2015 Document Reviewed: 01/24/2015 Elsevier Interactive Patient Education  2018 [employer] Pain Management Discharge Instructions  General Discharge Instructions :  If you need to reach your doctor call: Monday-Friday 8:00 am - 4:00 pm at [phone] or toll free [phone].  After clinic hours [phone] to have operator reach doctor.  Bring all of your medication bottles to all your appointments in the pain clinic.  To cancel or reschedule your appointment with Pain Management please remember to call 24 hours in advance to avoid a fee.  Refer to the educational materials which you have been given on: General Risks, I had my Procedure. Discharge Instructions, Post Sedation.  Post  Procedure Instructions:  The drugs you were given will stay in your system until tomorrow, so for the next 24 hours you should not drive, make any legal decisions or drink any alcoholic beverages.  You may eat anything you prefer, but it is better  to start with liquids then soups and crackers, and gradually work up to solid foods.  Please notify your doctor immediately if you have any unusual bleeding, trouble breathing or pain that is not related to your normal pain.  Depending on the type of procedure that was done, some parts of your body may feel week and/or numb.  This usually clears up by tonight or the next day.  Walk with the use of an assistive device or accompanied by an adult for the 24 hours.  You may use ice on the affected area for the first 24 hours.  Put ice in a Ziploc bag and cover with a towel and place against area 15 minutes on 15 minutes off.  You may switch to heat after 24 hours.

## 2017-03-27 ENCOUNTER — Telehealth: Payer: Self-pay | Admitting: *Deleted

## 2017-03-27 NOTE — Telephone Encounter (Signed)
Called patient back at number she gave with no answer X 2. Left voicemail for patient to return call.

## 2017-03-28 NOTE — Telephone Encounter (Signed)
Unable to take Gabapentin that was prescribed. Wants to come for appointment to discuss. Transferred to clerical to make appointment.

## 2017-03-28 NOTE — Telephone Encounter (Signed)
Patient was at dentist yesterday, please call again today

## 2017-04-02 ENCOUNTER — Ambulatory Visit: Payer: Medicare Other | Attending: Nurse Practitioner | Admitting: Nurse Practitioner

## 2017-04-02 ENCOUNTER — Encounter: Payer: Self-pay | Admitting: Nurse Practitioner

## 2017-04-02 VITALS — BP 133/82 | HR 84 | Temp 97.8°F | Resp 16 | Ht 63.5 in | Wt 145.0 lb

## 2017-04-02 DIAGNOSIS — M479 Spondylosis, unspecified: Secondary | ICD-10-CM | POA: Insufficient documentation

## 2017-04-02 DIAGNOSIS — M542 Cervicalgia: Secondary | ICD-10-CM | POA: Insufficient documentation

## 2017-04-02 DIAGNOSIS — M25552 Pain in left hip: Secondary | ICD-10-CM | POA: Diagnosis not present

## 2017-04-02 DIAGNOSIS — Z96641 Presence of right artificial hip joint: Secondary | ICD-10-CM | POA: Insufficient documentation

## 2017-04-02 DIAGNOSIS — M5416 Radiculopathy, lumbar region: Secondary | ICD-10-CM | POA: Diagnosis not present

## 2017-04-02 DIAGNOSIS — E785 Hyperlipidemia, unspecified: Secondary | ICD-10-CM | POA: Diagnosis not present

## 2017-04-02 DIAGNOSIS — Z7902 Long term (current) use of antithrombotics/antiplatelets: Secondary | ICD-10-CM | POA: Diagnosis not present

## 2017-04-02 DIAGNOSIS — I429 Cardiomyopathy, unspecified: Secondary | ICD-10-CM | POA: Insufficient documentation

## 2017-04-02 DIAGNOSIS — M545 Low back pain: Secondary | ICD-10-CM | POA: Insufficient documentation

## 2017-04-02 DIAGNOSIS — G8929 Other chronic pain: Secondary | ICD-10-CM | POA: Diagnosis not present

## 2017-04-02 DIAGNOSIS — F17210 Nicotine dependence, cigarettes, uncomplicated: Secondary | ICD-10-CM | POA: Insufficient documentation

## 2017-04-02 DIAGNOSIS — M25551 Pain in right hip: Secondary | ICD-10-CM

## 2017-04-02 DIAGNOSIS — I2510 Atherosclerotic heart disease of native coronary artery without angina pectoris: Secondary | ICD-10-CM | POA: Insufficient documentation

## 2017-04-02 DIAGNOSIS — M170 Bilateral primary osteoarthritis of knee: Secondary | ICD-10-CM | POA: Diagnosis not present

## 2017-04-02 DIAGNOSIS — Z79899 Other long term (current) drug therapy: Secondary | ICD-10-CM | POA: Insufficient documentation

## 2017-04-02 DIAGNOSIS — I509 Heart failure, unspecified: Secondary | ICD-10-CM | POA: Diagnosis not present

## 2017-04-02 DIAGNOSIS — I110 Hypertensive heart disease with heart failure: Secondary | ICD-10-CM | POA: Diagnosis not present

## 2017-04-02 DIAGNOSIS — G894 Chronic pain syndrome: Secondary | ICD-10-CM | POA: Insufficient documentation

## 2017-04-02 DIAGNOSIS — M5441 Lumbago with sciatica, right side: Secondary | ICD-10-CM

## 2017-04-02 MED ORDER — CYCLOBENZAPRINE HCL 5 MG PO TABS: 5.0000 mg | ORAL_TABLET | Freq: Three times a day (TID) | ORAL | 0 refills | Status: AC | PRN

## 2017-04-02 MED ORDER — PREDNISONE 20 MG PO TABS: ORAL_TABLET | ORAL | 0 refills | Status: AC
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
## 2017-04-02 NOTE — Progress Notes (Signed)
Safety precautions to be maintained throughout the outpatient stay will include: orient to surroundings, keep bed in low position, maintain call bell within reach at all times, provide assistance with transfer out of bed and ambulation.  

## 2017-04-03 NOTE — Patient Instructions (Signed)

## 2017-04-22 ENCOUNTER — Encounter: Payer: Medicare Other | Admitting: Pain Medicine

## 2017-04-30 DIAGNOSIS — M7918 Myalgia, other site: Secondary | ICD-10-CM

## 2017-04-30 DIAGNOSIS — G8929 Other chronic pain: Secondary | ICD-10-CM | POA: Insufficient documentation

## 2017-04-30 NOTE — Progress Notes (Signed)
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 (Medications Ordered): Meds ordered this encounter  Medications  . orphenadrine (NORFLEX) injection 60 mg  . ketorolac (TORADOL) injection 60 mg  . traMADol (ULTRAM) 50 MG tablet    Sig: Take 1 tablet (50 mg total) by mouth every 6 (six) hours as needed for severe pain.    Dispense:  120 tablet    Refill:  0    Fill one day early if pharmacy is closed on scheduled refill date. Do not fill until: 05/01/17 To last until: 05/31/17  . pregabalin (LYRICA) 25 MG capsule    Sig: Take 1 capsule (25 mg total) by mouth 3 (three) times daily.    Dispense:  90 capsule    Refill:  0    Do not place this medication, or any other prescription from our practice, on "Automatic Refill". Patient may have prescription filled one day early if pharmacy is closed on scheduled refill date.   Medications administered today: Brittony Billick. Buzan  had no medications administered during this visit.   Procedure Orders     Lumbar Transforaminal Epidural Lab Orders  No laboratory test(s) ordered today   Imaging Orders  No imaging studies ordered today   Referral Orders  No referral(s) requested today    Interventional management options: Planned, scheduled, and/or pending:   NOTE:Stop Plavix x 7 days prior to procedures. Written by PCP. 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) Diagnostic right L2 TFESI + right sided L2-3 interlaminar lumbar epidural steroid injection under fluoroscopic guidance and IV sedation   Considering:   Diagnostic bilateral lumbar facet block Possible bilateral lumbar facet RFA Diagnostic right-sided L5-S1 lumbar epidural steroid injection Diagnostic right-sided caudal epidural steroid injection + diagnostic epidurogram Possible right sided Racz procedure Possible bilateral lumbar spinal cord stimulator trial Diagnostic bilateral cervical facet block Possible bilateral cervical facet RFA Diagnostic left cervical epidural steroid injection Diagnostic left intra-articular hip joint injection Diagnostic bilateral femoral nerve + obturator nerve block Possible bilateral femoral nerve + obturator nerve RFA Diagnostic bilateral intra-articular knee joint injection with local anesthetic and steroid Possible bilateral series of 5 intra-articular Hyalgan knee injections Diagnostic bilateral genicular nerve block Possible bilateral genicular nerve RFA   Palliative PRN treatment(s):   None at this time   Provider-requested follow-up: Return for Procedure (w/ sedation): (R) L2 TFESI + (R) L2-3 LESI, (Blood-thinner Protocol).  Future Appointments  Date Time Provider [place]  05/23/2017 12:30 PM Milinda Pointer, MD [facility] None   Primary Care Physician: Kendrick Ranch, MD Location: [facility] Note by: Gaspar Cola, MD Date: 05/01/2017; Time: 8:07 PM

## 2017-05-01 ENCOUNTER — Encounter: Payer: Self-pay | Admitting: Pain Medicine

## 2017-05-01 ENCOUNTER — Ambulatory Visit: Payer: Medicare Other | Attending: Pain Medicine | Admitting: Pain Medicine

## 2017-05-01 ENCOUNTER — Other Ambulatory Visit: Payer: Self-pay

## 2017-05-01 VITALS — BP 156/86 | HR 86 | Temp 98.6°F | Resp 16 | Ht 64.0 in | Wt 150.0 lb

## 2017-05-01 DIAGNOSIS — M546 Pain in thoracic spine: Secondary | ICD-10-CM | POA: Insufficient documentation

## 2017-05-01 DIAGNOSIS — E876 Hypokalemia: Secondary | ICD-10-CM | POA: Diagnosis not present

## 2017-05-01 DIAGNOSIS — E785 Hyperlipidemia, unspecified: Secondary | ICD-10-CM | POA: Diagnosis not present

## 2017-05-01 DIAGNOSIS — M542 Cervicalgia: Secondary | ICD-10-CM | POA: Insufficient documentation

## 2017-05-01 DIAGNOSIS — M792 Neuralgia and neuritis, unspecified: Secondary | ICD-10-CM | POA: Diagnosis not present

## 2017-05-01 DIAGNOSIS — Z86718 Personal history of other venous thrombosis and embolism: Secondary | ICD-10-CM | POA: Diagnosis not present

## 2017-05-01 DIAGNOSIS — Z79890 Hormone replacement therapy (postmenopausal): Secondary | ICD-10-CM | POA: Insufficient documentation

## 2017-05-01 DIAGNOSIS — G894 Chronic pain syndrome: Secondary | ICD-10-CM | POA: Diagnosis not present

## 2017-05-01 DIAGNOSIS — S32020S Wedge compression fracture of second lumbar vertebra, sequela: Secondary | ICD-10-CM | POA: Diagnosis not present

## 2017-05-01 DIAGNOSIS — Z96641 Presence of right artificial hip joint: Secondary | ICD-10-CM | POA: Insufficient documentation

## 2017-05-01 DIAGNOSIS — I509 Heart failure, unspecified: Secondary | ICD-10-CM | POA: Insufficient documentation

## 2017-05-01 DIAGNOSIS — M5441 Lumbago with sciatica, right side: Secondary | ICD-10-CM | POA: Diagnosis not present

## 2017-05-01 DIAGNOSIS — M545 Low back pain: Secondary | ICD-10-CM | POA: Insufficient documentation

## 2017-05-01 DIAGNOSIS — M4856XA Collapsed vertebra, not elsewhere classified, lumbar region, initial encounter for fracture: Secondary | ICD-10-CM | POA: Diagnosis not present

## 2017-05-01 DIAGNOSIS — M7918 Myalgia, other site: Secondary | ICD-10-CM | POA: Diagnosis not present

## 2017-05-01 DIAGNOSIS — I429 Cardiomyopathy, unspecified: Secondary | ICD-10-CM | POA: Insufficient documentation

## 2017-05-01 DIAGNOSIS — M25561 Pain in right knee: Secondary | ICD-10-CM | POA: Diagnosis not present

## 2017-05-01 DIAGNOSIS — I2510 Atherosclerotic heart disease of native coronary artery without angina pectoris: Secondary | ICD-10-CM | POA: Insufficient documentation

## 2017-05-01 DIAGNOSIS — M25552 Pain in left hip: Secondary | ICD-10-CM

## 2017-05-01 DIAGNOSIS — F17210 Nicotine dependence, cigarettes, uncomplicated: Secondary | ICD-10-CM | POA: Insufficient documentation

## 2017-05-01 DIAGNOSIS — I252 Old myocardial infarction: Secondary | ICD-10-CM | POA: Diagnosis not present

## 2017-05-01 DIAGNOSIS — M79604 Pain in right leg: Secondary | ICD-10-CM | POA: Diagnosis not present

## 2017-05-01 DIAGNOSIS — G8929 Other chronic pain: Secondary | ICD-10-CM

## 2017-05-01 DIAGNOSIS — Z79891 Long term (current) use of opiate analgesic: Secondary | ICD-10-CM | POA: Diagnosis not present

## 2017-05-01 DIAGNOSIS — M25562 Pain in left knee: Secondary | ICD-10-CM | POA: Insufficient documentation

## 2017-05-01 DIAGNOSIS — I110 Hypertensive heart disease with heart failure: Secondary | ICD-10-CM | POA: Insufficient documentation

## 2017-05-01 DIAGNOSIS — Z7901 Long term (current) use of anticoagulants: Secondary | ICD-10-CM | POA: Diagnosis not present

## 2017-05-01 DIAGNOSIS — Z79899 Other long term (current) drug therapy: Secondary | ICD-10-CM | POA: Insufficient documentation

## 2017-05-01 DIAGNOSIS — M25551 Pain in right hip: Secondary | ICD-10-CM | POA: Diagnosis not present

## 2017-05-01 DIAGNOSIS — J45909 Unspecified asthma, uncomplicated: Secondary | ICD-10-CM | POA: Insufficient documentation

## 2017-05-01 DIAGNOSIS — Z7902 Long term (current) use of antithrombotics/antiplatelets: Secondary | ICD-10-CM | POA: Insufficient documentation

## 2017-05-01 DIAGNOSIS — Z5181 Encounter for therapeutic drug level monitoring: Secondary | ICD-10-CM | POA: Diagnosis not present

## 2017-05-01 DIAGNOSIS — F419 Anxiety disorder, unspecified: Secondary | ICD-10-CM | POA: Insufficient documentation

## 2017-05-01 MED ORDER — TRAMADOL HCL 50 MG PO TABS: 50.0000 mg | ORAL_TABLET | Freq: Four times a day (QID) | ORAL | 0 refills | Status: DC | PRN

## 2017-05-01 MED ORDER — KETOROLAC TROMETHAMINE 60 MG/2ML IM SOLN: 60.0000 mg | Freq: Once | INTRAMUSCULAR | Status: DC

## 2017-05-01 MED ORDER — ORPHENADRINE CITRATE 30 MG/ML IJ SOLN: 60.0000 mg | Freq: Once | INTRAMUSCULAR | Status: DC

## 2017-05-01 MED ORDER — PREGABALIN 25 MG PO CAPS: 25.0000 mg | ORAL_CAPSULE | Freq: Three times a day (TID) | ORAL | 0 refills | Status: DC

## 2017-05-01 NOTE — Patient Instructions (Addendum)
____________________________________________________________________________________________  Preparing for Procedure with Sedation Instructions: . Oral Intake: Do not eat or drink anything for at least 8 hours prior to your procedure. . Transportation: Public transportation is not allowed. Bring an adult driver. The driver must be physically present in our waiting room before any procedure can be started. Marland Kitchen Physical Assistance: Bring an adult physically capable of assisting you, in the event you need help. This adult should keep you company at home for at least 6 hours after the procedure. . Blood Pressure Medicine: Take your blood pressure medicine with a sip of water the morning of the procedure. . Blood thinners:  . Diabetics on insulin: Notify the staff so that you can be scheduled 1st case in the morning. If your diabetes requires high dose insulin, take only  of your normal insulin dose the morning of the procedure and notify the staff that you have done so. . Preventing infections: Shower with an antibacterial soap the morning of your procedure. . Build-up your immune system: Take 1000 mg of Vitamin C with every meal (3 times a day) the day prior to your procedure. Marland Kitchen Antibiotics: Inform the staff if you have a condition or reason that requires you to take antibiotics before dental procedures. . Pregnancy: If you are pregnant, call and cancel the procedure. . Sickness: If you have a cold, fever, or any active infections, call and cancel the procedure. . Arrival: You must be in the facility at least 30 minutes prior to your scheduled procedure. . Children: Do not bring children with you. . Dress appropriately: Bring dark clothing that you would not mind if they get stained. . Valuables: Do not bring any jewelry or valuables. Procedure appointments are reserved for interventional treatments only. Marland Kitchen No Prescription Refills. . No medication changes will be discussed during procedure  appointments. . No disability issues will be discussed. ____________________________________________________________________________________________    ____________________________________________________________________________________________  Medication Rules  Applies to: All patients receiving prescriptions (written or electronic).  Pharmacy of record: Pharmacy where electronic prescriptions will be sent. If written prescriptions are taken to a different pharmacy, please inform the nursing staff. The pharmacy listed in the electronic medical record should be the one where you would like electronic prescriptions to be sent.  Prescription refills: Only during scheduled appointments. Applies to both, written and electronic prescriptions.  NOTE: The following applies primarily to controlled substances (Opioid* Pain Medications).   Patient's responsibilities: 1. Pain Pills: Bring all pain pills to every appointment (except for procedure appointments). 2. Pill Bottles: Bring pills in original pharmacy bottle. Always bring newest bottle. Bring bottle, even if empty. 3. Medication refills: You are responsible for knowing and keeping track of what medications you need refilled. The day before your appointment, write a list of all prescriptions that need to be refilled. Bring that list to your appointment and give it to the admitting nurse. Prescriptions will be written only during appointments. If you forget a medication, it will not be "Called in", "Faxed", or "electronically sent". You will need to get another appointment to get these prescribed. 4. Prescription Accuracy: You are responsible for carefully inspecting your prescriptions before leaving our office. Have the discharge nurse carefully go over each prescription with you, before taking them home. Make sure that your name is accurately spelled, that your address is correct. Check the name and dose of your medication to make sure it is  accurate. Check the number of pills, and the written instructions to make sure they are clear and  accurate. Make sure that you are given enough medication to last until your next medication refill appointment. 5. Taking Medication: Take medication as prescribed. Never take more pills than instructed. Never take medication more frequently than prescribed. Taking less pills or less frequently is permitted and encouraged, when it comes to controlled substances (written prescriptions).  6. Inform other Doctors: Always inform, all of your healthcare providers, of all the medications you take. 7. Pain Medication from other Providers: You are not allowed to accept any additional pain medication from any other Doctor or Healthcare provider. There are two exceptions to this rule. (see below) In the event that you require additional pain medication, you are responsible for notifying us, as stated below. 8. Medication Agreement: You are responsible for carefully reading and following our Medication Agreement. This must be signed before receiving any prescriptions from our practice. Safely store a copy of your signed Agreement. Violations to the Agreement will result in no further prescriptions. (Additional copies of our Medication Agreement are available upon request.) 9. Laws, Rules, & Regulations: All patients are expected to follow all [address]Federal and Walt DisneyState Laws, [employer] IndustriesStatutes, Rules, [place] [employer]& Regulations. Ignorance of the Laws does not constitute a valid excuse. The use of any illegal substances is prohibited. 10. Adopted CDC guidelines & recommendations: Target dosing levels will be at or below 60 MME/day. Use of benzodiazepines** is not recommended.  Exceptions: There are only two exceptions to the rule of not receiving pain medications from other Healthcare Providers. 1. Exception #1 (Emergencies): In the event of an emergency (i.e.: accident requiring emergency care), you are allowed to receive additional pain medication.  However, you are responsible for: As soon as you are able, call our office [phone], at any time of the day or night, and leave a message stating your name, the date and nature of the emergency, and the name and dose of the medication prescribed. In the event that your call is answered by a member of our staff, make sure to document and save the date, time, and the name of the person that took your information.  2. Exception #2 (Planned Surgery): In the event that you are scheduled by another doctor or dentist to have any type of surgery or procedure, you are allowed (for a period no longer than 30 days), to receive additional pain medication, for the acute post-op pain. However, in this case, you are responsible for picking up a copy of our "Post-op Pain Management for Surgeons" handout, and giving it to your surgeon or dentist. This document is available at our office, and does not require an appointment to obtain it. Simply go to our office during business hours (Monday-Thursday from 8:00 AM to 4:00 PM) (Friday 8:00 AM to 12:00 Noon) or if you have a scheduled appointment with us, prior to your surgery, and ask for it by name. In addition, you will need to provide us with your name, name of your surgeon, type of surgery, and date of procedure or surgery.  *Opioid medications include: morphine, codeine, oxycodone, oxymorphone, hydrocodone, hydromorphone, meperidine, tramadol, tapentadol, buprenorphine, fentanyl, methadone. **Benzodiazepine medications include: diazepam (Valium), alprazolam (Xanax), clonazepam (Klonopine), lorazepam (Ativan), clorazepate (Tranxene), chlordiazepoxide (Librium), estazolam (Prosom), oxazepam (Serax), temazepam (Restoril), triazolam (Halcion)  ____________________________________________________________________________________________ ____________________________________________________________________________________________  Pain Scale  Introduction: The pain score  used by this practice is the Verbal Numerical Rating Scale (VNRS-11). This is an 11-point scale. It is for adults and children 10 years or older. There are significant differences in how the pain score  is reported, used, and applied. Forget everything you learned in the past and learn this scoring system.  General Information: The scale should reflect your current level of pain. Unless you are specifically asked for the level of your worst pain, or your average pain. If you are asked for one of these two, then it should be understood that it is over the past 24 hours.  Basic Activities of Daily Living (ADL): Personal hygiene, dressing, eating, transferring, and using restroom.  Instructions: Most patients tend to report their level of pain as a combination of two factors, their physical pain and their psychosocial pain. This last one is also known as "suffering" and it is reflection of how physical pain affects you socially and psychologically. From now on, report them separately. From this point on, when asked to report your pain level, report only your physical pain. Use the following table for reference.  Pain Clinic Pain Levels (0-5/10)  Pain Level Score  Description  No Pain 0   Mild pain 1 Nagging, annoying, but does not interfere with basic activities of daily living (ADL). Patients are able to eat, bathe, get dressed, toileting (being able to get on and off the toilet and perform personal hygiene functions), transfer (move in and out of bed or a chair without assistance), and maintain continence (able to control bladder and bowel functions). Blood pressure and heart rate are unaffected. A normal heart rate for a healthy adult ranges from 60 to 100 bpm (beats per minute).   Mild to moderate pain 2 Noticeable and distracting. Impossible to hide from other people. More frequent flare-ups. Still possible to adapt and function close to normal. It can be very annoying and may have occasional stronger  flare-ups. With discipline, patients may get used to it and adapt.   Moderate pain 3 Interferes significantly with activities of daily living (ADL). It becomes difficult to feed, bathe, get dressed, get on and off the toilet or to perform personal hygiene functions. Difficult to get in and out of bed or a chair without assistance. Very distracting. With effort, it can be ignored when deeply involved in activities.   Moderately severe pain 4 Impossible to ignore for more than a few minutes. With effort, patients may still be able to manage work or participate in some social activities. Very difficult to concentrate. Signs of autonomic nervous system discharge are evident: dilated pupils (mydriasis); mild sweating (diaphoresis); sleep interference. Heart rate becomes elevated (>115 bpm). Diastolic blood pressure (lower number) rises above 100 mmHg. Patients find relief in laying down and not moving.   Severe pain 5 Intense and extremely unpleasant. Associated with frowning face and frequent crying. Pain overwhelms the senses.  Ability to do any activity or maintain social relationships becomes significantly limited. Conversation becomes difficult. Pacing back and forth is common, as getting into a comfortable position is nearly impossible. Pain wakes you up from deep sleep. Physical signs will be obvious: pupillary dilation; increased sweating; goosebumps; brisk reflexes; cold, clammy hands and feet; nausea, vomiting or dry heaves; loss of appetite; significant sleep disturbance with inability to fall asleep or to remain asleep. When persistent, significant weight loss is observed due to the complete loss of appetite and sleep deprivation.  Blood pressure and heart rate becomes significantly elevated. Caution: If elevated blood pressure triggers a pounding headache associated with blurred vision, then the patient should immediately seek attention at an urgent or emergency care unit, as these may be signs of an  impending stroke.  Emergency Department Pain Levels (6-10/10)  Emergency Room Pain 6 Severely limiting. Requires emergency care and should not be seen or managed at an outpatient pain management facility. Communication becomes difficult and requires great effort. Assistance to reach the emergency department may be required. Facial flushing and profuse sweating along with potentially dangerous increases in heart rate and blood pressure will be evident.   Distressing pain 7 Self-care is very difficult. Assistance is required to transport, or use restroom. Assistance to reach the emergency department will be required. Tasks requiring coordination, such as bathing and getting dressed become very difficult.   Disabling pain 8 Self-care is no longer possible. At this level, pain is disabling. The individual is unable to do even the most "basic" activities such as walking, eating, bathing, dressing, transferring to a bed, or toileting. Fine motor skills are lost. It is difficult to think clearly.   Incapacitating pain 9 Pain becomes incapacitating. Thought processing is no longer possible. Difficult to remember your own name. Control of movement and coordination are lost.   The worst pain imaginable 10 At this level, most patients pass out from pain. When this level is reached, collapse of the autonomic nervous system occurs, leading to a sudden drop in blood pressure and heart rate. This in turn results in a temporary and dramatic drop in blood flow to the brain, leading to a loss of consciousness. Fainting is one of the body's self defense mechanisms. Passing out puts the brain in a calmed state and causes it to shut down for a while, in order to begin the healing process.    Summary: 1. Refer to this scale when providing us with your pain level. 2. Be accurate and careful when reporting your pain level. This will help with your care. 3. Over-reporting your pain level will lead to loss of  credibility. 4. Even a level of 1/10 means that there is pain and will be treated at our facility. 5. High, inaccurate reporting will be documented as "Symptom Exaggeration", leading to loss of credibility and suspicions of possible secondary gains such as obtaining more narcotics, or wanting to appear disabled, for fraudulent reasons. 6. Only pain levels of 5 or below will be seen at our facility. 7. Pain levels of 6 and above will be sent to the Emergency Department and the appointment cancelled. ____________________________________________________________________________________________   GENERAL RISKS AND COMPLICATIONS  What are the risk, side effects and possible complications? Generally speaking, most procedures are safe.  However, with any procedure there are risks, side effects, and the possibility of complications.  The risks and complications are dependent upon the sites that are lesioned, or the type of nerve block to be performed.  The closer the procedure is to the spine, the more serious the risks are.  Great care is taken when placing the radio frequency needles, block needles or lesioning probes, but sometimes complications can occur. 1. Infection: Any time there is an injection through the skin, there is a risk of infection.  This is why sterile conditions are used for these blocks.  There are four possible types of infection. 1. Localized skin infection. 2. Central Nervous System Infection-This can be in the form of Meningitis, which can be deadly. 3. Epidural Infections-This can be in the form of an epidural abscess, which can cause pressure inside of the spine, causing compression of the spinal cord with subsequent paralysis. This would require an emergency surgery to decompress, and there are no guarantees that the patient would recover  from the paralysis. 4. Discitis-This is an infection of the intervertebral discs.  It occurs in about 1% of discography procedures.  It is  difficult to treat and it may lead to surgery.        2. Pain: the needles have to go through skin and soft tissues, will cause soreness.       3. Damage to internal structures:  The nerves to be lesioned may be near blood vessels or    other nerves which can be potentially damaged.       4. Bleeding: Bleeding is more common if the patient is taking blood thinners such as  aspirin, Coumadin, Ticiid, Plavix, etc., or if he/she have some genetic predisposition  such as hemophilia. Bleeding into the spinal canal can cause compression of the spinal  cord with subsequent paralysis.  This would require an emergency surgery to  decompress and there are no guarantees that the patient would recover from the  paralysis.       5. Pneumothorax:  Puncturing of a lung is a possibility, every time a needle is introduced in  the area of the chest or upper back.  Pneumothorax refers to free air around the  collapsed lung(s), inside of the thoracic cavity (chest cavity).  Another two possible  complications related to a similar event would include: Hemothorax and Chylothorax.   These are variations of the Pneumothorax, where instead of air around the collapsed  lung(s), you may have blood or chyle, respectively.       6. Spinal headaches: They may occur with any procedures in the area of the spine.       7. Persistent CSF (Cerebro-Spinal Fluid) leakage: This is a rare problem, but may occur  with prolonged intrathecal or epidural catheters either due to the formation of a fistulous  track or a dural tear.       8. Nerve damage: By working so close to the spinal cord, there is always a possibility of  nerve damage, which could be as serious as a permanent spinal cord injury with  paralysis.       9. Death:  Although rare, severe deadly allergic reactions known as "Anaphylactic  reaction" can occur to any of the medications used.      10. Worsening of the symptoms:  We can always make thing worse.  What are the chances of  something like this happening? Chances of any of this occuring are extremely low.  By statistics, you have more of a chance of getting killed in a motor vehicle accident: while driving to the hospital than any of the above occurring .  Nevertheless, you should be aware that they are possibilities.  In general, it is similar to taking a shower.  Everybody knows that you can slip, hit your head and get killed.  Does that mean that you should not shower again?  Nevertheless always keep in mind that statistics do not mean anything if you happen to be on the wrong side of them.  Even if a procedure has a 1 (one) in a 1,000,000 (million) chance of going wrong, it you happen to be that one..Also, keep in mind that by statistics, you have more of a chance of having something go wrong when taking medications.  Who should not have this procedure? If you are on a blood thinning medication (e.g. Coumadin, Plavix, see list of "Blood Thinners"), or if you have an active infection going on, you should not have the  procedure.  If you are taking any blood thinners, please inform your physician.  How should I prepare for this procedure?  Do not eat or drink anything at least six hours prior to the procedure.  Bring a driver with you .  It cannot be a taxi.  Come accompanied by an adult that can drive you back, and that is strong enough to help you if your legs get weak or numb from the local anesthetic.  Take all of your medicines the morning of the procedure with just enough water to swallow them.  If you have diabetes, make sure that you are scheduled to have your procedure done first thing in the morning, whenever possible.  If you have diabetes, take only half of your insulin dose and notify our nurse that you have done so as soon as you arrive at the clinic.  If you are diabetic, but only take blood sugar pills (oral hypoglycemic), then do not take them on the morning of your procedure.  You may take them  after you have had the procedure.  Do not take aspirin or any aspirin-containing medications, at least eleven (11) days prior to the procedure.  They may prolong bleeding.  Wear loose fitting clothing that may be easy to take off and that you would not mind if it got stained with Betadine or blood.  Do not wear any jewelry or perfume  Remove any nail coloring.  It will interfere with some of our monitoring equipment.  NOTE: Remember that this is not meant to be interpreted as a complete list of all possible complications.  Unforeseen problems may occur.  BLOOD THINNERS The following drugs contain aspirin or other products, which can cause increased bleeding during surgery and should not be taken for 2 weeks prior to and 1 week after surgery.  If you should need take something for relief of minor pain, you may take acetaminophen which is found in Tylenol,m Datril, Anacin-3 and Panadol. It is not blood thinner. The products listed below are.  Do not take any of the products listed below in addition to any listed on your instruction sheet.  A.P.C or A.P.C with Codeine Codeine Phosphate Capsules #3 Ibuprofen Ridaura  ABC compound Congesprin Imuran rimadil  Advil Cope Indocin Robaxisal  Alka-Seltzer Effervescent Pain Reliever and Antacid Coricidin or Coricidin-D  Indomethacin Rufen  Alka-Seltzer plus Cold Medicine Cosprin Ketoprofen S-A-C Tablets  Anacin Analgesic Tablets or Capsules Coumadin Korlgesic Salflex  Anacin Extra Strength Analgesic tablets or capsules CP-2 Tablets Lanoril Salicylate  Anaprox Cuprimine Capsules Levenox Salocol  Anexsia-D Dalteparin Magan Salsalate  Anodynos Darvon compound Magnesium Salicylate Sine-off  Ansaid Dasin Capsules Magsal Sodium Salicylate  Anturane Depen Capsules Marnal Soma  APF Arthritis pain formula Dewitt's Pills Measurin Stanback  Argesic Dia-Gesic Meclofenamic Sulfinpyrazone  Arthritis Bayer Timed Release Aspirin Diclofenac Meclomen Sulindac   Arthritis pain formula Anacin Dicumarol Medipren Supac  Analgesic (Safety coated) Arthralgen Diffunasal Mefanamic Suprofen  Arthritis Strength Bufferin Dihydrocodeine Mepro Compound Suprol  Arthropan liquid Dopirydamole Methcarbomol with Aspirin Synalgos  ASA tablets/Enseals Disalcid Micrainin Tagament  Ascriptin Doan's Midol Talwin  Ascriptin A/D Dolene Mobidin Tanderil  Ascriptin Extra Strength Dolobid Moblgesic Ticlid  Ascriptin with Codeine Doloprin or Doloprin with Codeine Momentum Tolectin  Asperbuf Duoprin Mono-gesic Trendar  Aspergum Duradyne Motrin or Motrin IB Triminicin  Aspirin plain, buffered or enteric coated Durasal Myochrisine Trigesic  Aspirin Suppositories Easprin Nalfon Trillsate  Aspirin with Codeine Ecotrin Regular or Extra Strength Naprosyn Uracel  Atromid-S Efficin Naproxen  Ursinus  Auranofin Capsules Elmiron Neocylate Vanquish  Axotal Emagrin Norgesic Verin  Azathioprine Empirin or Empirin with Codeine Normiflo Vitamin E  Azolid Emprazil Nuprin Voltaren  Bayer Aspirin plain, buffered or children's or timed BC Tablets or powders Encaprin Orgaran Warfarin Sodium  Buff-a-Comp Enoxaparin Orudis Zorpin  Buff-a-Comp with Codeine Equegesic Os-Cal-Gesic   Buffaprin Excedrin plain, buffered or Extra Strength Oxalid   Bufferin Arthritis Strength Feldene Oxphenbutazone   Bufferin plain or Extra Strength Feldene Capsules Oxycodone with Aspirin   Bufferin with Codeine Fenoprofen Fenoprofen Pabalate or Pabalate-SF   Buffets II Flogesic Panagesic   Buffinol plain or Extra Strength Florinal or Florinal with Codeine Panwarfarin   Buf-Tabs Flurbiprofen Penicillamine   Butalbital Compound Four-way cold tablets Penicillin   Butazolidin Fragmin Pepto-Bismol   Carbenicillin Geminisyn Percodan   Carna Arthritis Reliever Geopen Persantine   Carprofen Gold's salt Persistin   Chloramphenicol Goody's Phenylbutazone   Chloromycetin Haltrain Piroxlcam   Clmetidine heparin Plaquenil    Cllnoril Hyco-pap Ponstel   Clofibrate Hydroxy chloroquine Propoxyphen         Before stopping any of these medications, be sure to consult the physician who ordered them.  Some, such as Coumadin (Warfarin) are ordered to prevent or treat serious conditions such as "deep thrombosis", "pumonary embolisms", and other heart problems.  The amount of time that you may need off of the medication may also vary with the medication and the reason for which you were taking it.  If you are taking any of these medications, please make sure you notify your pain physician before you undergo any procedures.          Epidural Steroid Injection An epidural steroid injection is a shot of steroid medicine and numbing medicine that is given into the space between the spinal cord and the bones in your back (epidural space). The shot helps relieve pain caused by an irritated or swollen nerve root. The amount of pain relief you get from the injection depends on what is causing the nerve to be swollen and irritated, and how long your pain lasts. You are more likely to benefit from this injection if your pain is strong and comes on suddenly rather than if you have had pain for a long time. Tell a health care provider about:  Any allergies you have.  All medicines you are taking, including vitamins, herbs, eye drops, creams, and over-the-counter medicines.  Any problems you or family members have had with anesthetic medicines.  Any blood disorders you have.  Any surgeries you have had.  Any medical conditions you have.  Whether you are pregnant or may be pregnant. What are the risks? Generally, this is a safe procedure. However, problems may occur, including:  Headache.  Bleeding.  Infection.  Allergic reaction to medicines.  Damage to your nerves.  What happens before the procedure? Staying hydrated Follow instructions from your health care provider about hydration, which may include:  Up  to 2 hours before the procedure - you may continue to drink clear liquids, such as water, clear fruit juice, black coffee, and plain tea.  Eating and drinking restrictions Follow instructions from your health care provider about eating and drinking, which may include:  8 hours before the procedure - stop eating heavy meals or foods such as meat, fried foods, or fatty foods.  6 hours before the procedure - stop eating light meals or foods, such as toast or cereal.  6 hours before the procedure - stop drinking milk or drinks  that contain milk.  2 hours before the procedure - stop drinking clear liquids.  Medicine  You may be given medicines to lower anxiety.  Ask your health care provider about: ? Changing or stopping your regular medicines. This is especially important if you are taking diabetes medicines or blood thinners. ? Taking medicines such as aspirin and ibuprofen. These medicines can thin your blood. Do not take these medicines before your procedure if your health care provider instructs you not to. General instructions  Plan to have someone take you home from the hospital or clinic. What happens during the procedure?  You may receive a medicine to help you relax (sedative).  You will be asked to lie on your abdomen.  The injection site will be cleaned.  A numbing medicine (local anesthetic) will be used to numb the injection site.  A needle will be inserted through your skin into the epidural space. You may feel some discomfort when this happens. An X-ray machine will be used to make sure the needle is put as close as possible to the affected nerve.  A steroid medicine and a local anesthetic will be injected into the epidural space.  The needle will be removed.  A bandage (dressing) will be put over the injection site. What happens after the procedure?  Your blood pressure, heart rate, breathing rate, and blood oxygen level will be monitored until the medicines you  were given have worn off.  Your arm or leg may feel weak or numb for a few hours.  The injection site may feel sore.  Do not drive for 24 hours if you received a sedative. This information is not intended to replace advice given to you by your health care provider. Make sure you discuss any questions you have with your health care provider. Document Released: 08/07/2007 Document Revised: 10/12/2015 Document Reviewed: 08/16/2015 Elsevier Interactive Patient Education  [employer].

## 2017-05-15 ENCOUNTER — Telehealth: Payer: Self-pay

## 2017-05-15 NOTE — Telephone Encounter (Signed)
Called pt, no answer left message to inform her that Dr Heron NayVasireddy approved her to stop taking plavix for seven days and that she needed to schedule procedure appointment. Instructed to call with instructions

## 2017-05-23 ENCOUNTER — Ambulatory Visit: Payer: Medicare Other | Admitting: Pain Medicine

## 2017-05-23 NOTE — Telephone Encounter (Signed)
Sent to MD and Nurse

## 2017-11-06 ENCOUNTER — Encounter: Payer: Self-pay | Admitting: Gastroenterology

## 2017-11-06 ENCOUNTER — Encounter (INDEPENDENT_AMBULATORY_CARE_PROVIDER_SITE_OTHER): Payer: Self-pay

## 2017-11-06 ENCOUNTER — Ambulatory Visit (INDEPENDENT_AMBULATORY_CARE_PROVIDER_SITE_OTHER): Payer: Medicare Other | Admitting: Gastroenterology

## 2017-11-06 VITALS — BP 116/70 | HR 78 | Ht 63.0 in | Wt 174.1 lb

## 2017-11-06 DIAGNOSIS — Z1211 Encounter for screening for malignant neoplasm of colon: Secondary | ICD-10-CM | POA: Diagnosis not present

## 2017-11-06 DIAGNOSIS — R142 Eructation: Secondary | ICD-10-CM | POA: Diagnosis not present

## 2017-11-06 DIAGNOSIS — R1084 Generalized abdominal pain: Secondary | ICD-10-CM | POA: Insufficient documentation

## 2017-11-06 DIAGNOSIS — R197 Diarrhea, unspecified: Secondary | ICD-10-CM | POA: Diagnosis not present

## 2017-11-06 DIAGNOSIS — K219 Gastro-esophageal reflux disease without esophagitis: Secondary | ICD-10-CM | POA: Diagnosis not present

## 2017-11-06 DIAGNOSIS — Z7902 Long term (current) use of antithrombotics/antiplatelets: Secondary | ICD-10-CM

## 2017-11-06 MED ORDER — NA SULFATE-K SULFATE-MG SULF 17.5-3.13-1.6 GM/177ML PO SOLN: 1.0000 | ORAL | 0 refills | Status: DC

## 2017-11-06 NOTE — Patient Instructions (Signed)
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
## 2017-11-06 NOTE — Progress Notes (Signed)
Complicated patient. consultation note reviewed. Plan as noted

## 2018-01-07 ENCOUNTER — Telehealth: Payer: Self-pay | Admitting: Gastroenterology

## 2018-01-07 NOTE — Telephone Encounter (Signed)
Ok to prescribe

## 2018-01-07 NOTE — Telephone Encounter (Signed)
Jessica BumpsJessica can we refill the Bentyl? Looks like PCP prescribed.

## 2018-01-08 ENCOUNTER — Telehealth: Payer: Self-pay | Admitting: Emergency Medicine

## 2018-01-08 MED ORDER — DICYCLOMINE HCL 10 MG PO CAPS: 10.0000 mg | ORAL_CAPSULE | Freq: Three times a day (TID) | ORAL | 0 refills | Status: DC

## 2018-01-08 NOTE — Telephone Encounter (Signed)
The prescription has been sent as requested pt aware

## 2018-01-08 NOTE — Telephone Encounter (Signed)
   Jessica HoseLaura S Lookabaugh Aug 25, 1960 [id number]  Dear Dr. :  We have scheduled the above named patient for a colonoscopy procedure. Our records show that she is on anticoagulation therapy.  Please advise as to whether the patient may come off their therapy of Plavix 5 days prior to their procedure which is scheduled for 01-15-18.  Please route your response to Deneise Leveresiree Aristide Waggle, CMA or fax response to [phone].  Sincerely,    [facility]

## 2018-01-09 NOTE — Telephone Encounter (Signed)
Spoke to patient and informed her she states she is still having nausea and a lot of stomach pain and what can she do in the meantime. Endo/colon canceled and patient will call back after cardiology consult.

## 2018-01-09 NOTE — Telephone Encounter (Signed)
[facility] Best call back #[phone].

## 2018-01-09 NOTE — Telephone Encounter (Signed)
Received fax from patients PCP Herbert DeanerMeredith Harris she states: "we have only seen her one time, [place], has initial appointment with a new cardiologist in October. I recommend rescheduling colonoscopyand patient getting established with cardiology and letting them make decision regarding holding Plavix."   Left message on patients machine to inform her. Will cancel colonoscopy.

## 2018-01-09 NOTE — Telephone Encounter (Signed)
She had been given Bentyl and should be taking that.  Can give some zofran for nausea to use every 6-8 hours prn #30, one refill.  We can also start pantoprazole 40 mg daily #30, one refill.  She can call cardiology and see if she can move her appt with them up sooner.

## 2018-01-10 MED ORDER — PANTOPRAZOLE SODIUM 40 MG PO TBEC: 40.0000 mg | DELAYED_RELEASE_TABLET | Freq: Every day | ORAL | 3 refills | Status: DC

## 2018-01-10 MED ORDER — ONDANSETRON 8 MG PO TBDP: 8.0000 mg | ORAL_TABLET | Freq: Four times a day (QID) | ORAL | 0 refills | Status: DC | PRN

## 2018-01-10 NOTE — Telephone Encounter (Signed)
Patient returned phone call. °

## 2018-01-10 NOTE — Telephone Encounter (Signed)
Spoke to patient and informed her of new medications being sent to pharmacy. She expressed her gratitude and understanding. She states she is feeling very sick and has not been able to "really eat anything" for 5 days now due to nausea and abdominal pain. She has spoken to cardiology and the soonest appt they have is 10/24.

## 2018-01-10 NOTE — Telephone Encounter (Signed)
Left message on patients voicemail to call office.   

## 2018-01-15 ENCOUNTER — Encounter: Payer: Medicare Other | Admitting: Internal Medicine

## 2018-02-03 ENCOUNTER — Other Ambulatory Visit: Payer: Self-pay | Admitting: Gastroenterology

## 2018-03-13 ENCOUNTER — Telehealth: Payer: Self-pay | Admitting: Gastroenterology

## 2018-03-13 NOTE — Telephone Encounter (Signed)
She can be scheduled for previsit and colon endo.  Thank you

## 2018-03-13 NOTE — Telephone Encounter (Signed)
Patient states she received clearance from her cardiologist and is ready to reschedule her endo colon. Patient is on BT but was told to stop it a week before procedure. Please advise on scheduling this pt. Does she need pv and [facility] appt for something else.

## 2018-03-25 ENCOUNTER — Encounter: Payer: Self-pay | Admitting: Internal Medicine

## 2018-04-23 ENCOUNTER — Ambulatory Visit (AMBULATORY_SURGERY_CENTER): Payer: Self-pay

## 2018-04-23 VITALS — Ht 63.0 in | Wt 176.8 lb

## 2018-04-23 DIAGNOSIS — Z800 Family history of malignant neoplasm of digestive organs: Secondary | ICD-10-CM

## 2018-04-23 DIAGNOSIS — R1030 Lower abdominal pain, unspecified: Secondary | ICD-10-CM

## 2018-04-23 DIAGNOSIS — K219 Gastro-esophageal reflux disease without esophagitis: Secondary | ICD-10-CM

## 2018-04-23 NOTE — Progress Notes (Signed)
Per pt, no allergies to soy or egg products.Pt not taking any weight loss meds or using  O2 at home.  Pt refused emmi videos.

## 2018-04-29 ENCOUNTER — Encounter: Payer: Self-pay | Admitting: Internal Medicine

## 2018-04-29 ENCOUNTER — Ambulatory Visit (AMBULATORY_SURGERY_CENTER): Payer: Medicare Other | Admitting: Internal Medicine

## 2018-04-29 VITALS — BP 117/65 | HR 72 | Temp 97.5°F | Resp 13 | Ht 63.0 in | Wt 176.0 lb

## 2018-04-29 DIAGNOSIS — K219 Gastro-esophageal reflux disease without esophagitis: Secondary | ICD-10-CM

## 2018-04-29 DIAGNOSIS — K5730 Diverticulosis of large intestine without perforation or abscess without bleeding: Secondary | ICD-10-CM

## 2018-04-29 DIAGNOSIS — R197 Diarrhea, unspecified: Secondary | ICD-10-CM | POA: Diagnosis not present

## 2018-04-29 DIAGNOSIS — Z800 Family history of malignant neoplasm of digestive organs: Secondary | ICD-10-CM

## 2018-04-29 MED ORDER — SODIUM CHLORIDE 0.9 % IV SOLN: 500.0000 mL | Freq: Once | INTRAVENOUS | Status: DC

## 2018-04-29 NOTE — Progress Notes (Signed)
Spontaneous respirations throughout. VSS. Resting comfortably. To PACU on room air. Report to  RN. 

## 2018-04-29 NOTE — Progress Notes (Signed)
Pt's states no medical or surgical changes since previsit or office visit. 

## 2018-04-29 NOTE — Progress Notes (Signed)
Called to room to assist during endoscopic procedure.  Patient ID and intended procedure confirmed with present staff. Received instructions for my participation in the procedure from the performing physician.  

## 2018-04-29 NOTE — Op Note (Signed)
[facility] Patient Name: Jessica Bell Procedure Date: 04/29/2018 1:40 PM MRN: [id number] Endoscopist: Wilhemina Bonito. [name] , MD Age: 57 Referring MD:  Date of Birth: [birth year] Gender: Female Account #: [id number] Procedure:                Colonoscopy with biopsies Indications:              Screening in patient at increased risk: Colorectal                            cancer in brother before age 18, Incidental                            diarrhea noted Medicines:                Monitored Anesthesia Care Procedure:                Pre-Anesthesia Assessment:                           - Prior to the procedure, a History and Physical                            was performed, and patient medications and                            allergies were reviewed. The patient's tolerance of                            previous anesthesia was also reviewed. The risks                            and benefits of the procedure and the sedation                            options and risks were discussed with the patient.                            All questions were answered, and informed consent                            was obtained. Prior Anticoagulants: The patient has                            taken Plavix (clopidogrel), last dose was 7 days                            prior to procedure. ASA Grade Assessment: III - A                            patient with severe systemic disease. After                            reviewing the risks and benefits, the patient was  deemed in satisfactory condition to undergo the                            procedure.                           After obtaining informed consent, the colonoscope                            was passed under direct vision. Throughout the                            procedure, the patient's blood pressure, pulse, and                            oxygen saturations were monitored continuously. The        Colonoscope was introduced through the anus and                            advanced to the the cecum, identified by                            appendiceal orifice and ileocecal valve. The                            terminal ileum, ileocecal valve, appendiceal                            orifice, and rectum were photographed. The quality                            of the bowel preparation was good. The colonoscopy                            was performed without difficulty. The patient                            tolerated the procedure well. The bowel preparation                            used was SUPREP. Scope In: 1:52:35 PM Scope Out: 2:08:42 PM Scope Withdrawal Time: 0 hours 11 minutes 28 seconds  Total Procedure Duration: 0 hours 16 minutes 7 seconds  Findings:                 The terminal ileum appeared normal.                           Multiple diverticula were found in the sigmoid                            colon.                           The entire examined colon appeared otherwise normal  on direct and retroflexion views. Biopsies for                            histology were taken with a cold forceps from the                            entire colon for evaluation of microscopic colitis. Complications:            No immediate complications. Estimated blood loss:                            None. Estimated Blood Loss:     Estimated blood loss: none. Impression:               - The examined portion of the ileum was normal.                           - Diverticulosis in the sigmoid colon.                           - The entire examined colon is otherwise normal on                            direct and retroflexion views. Recommendation:           - Repeat colonoscopy in 5 years for surveillance                            (family history).                           - Resume Plavix (clopidogrel) today at prior dose.                           - Patient  has a contact number available for                            emergencies. The signs and symptoms of potential                            delayed complications were discussed with the                            patient. Return to normal activities tomorrow.                            Written discharge instructions were provided to the                            patient.                           - Resume previous diet.                           - Continue present medications.                           -  Await pathology results.                           - Please make an office follow-up with Dr. [name] in                            4 to 6 weeks to review findings, impressions, and                            plans [name] N. [name], MD 04/29/2018 2:15:30 PM This report has been signed electronically.

## 2018-04-29 NOTE — Patient Instructions (Signed)
Thank you for allowing us to care for you today!  Await results by mail, approximately 2 weeks.  Recommend surveillance colonoscopy in 5 years.  Resume Plavix today.  Resume all other medications today.    Return to normal activities tomorrow.    YOU HAD AN ENDOSCOPIC PROCEDURE TODAY AT THE [facility]:   Refer to the procedure report that was given to you for any specific questions about what was found during the examination.  If the procedure report does not answer your questions, please call your gastroenterologist to clarify.  If you requested that your care partner not be given the details of your procedure findings, then the procedure report has been included in a sealed envelope for you to review at your convenience later.  YOU SHOULD EXPECT: Some feelings of bloating in the abdomen. Passage of more gas than usual.  Walking can help get rid of the air that was put into your GI tract during the procedure and reduce the bloating. If you had a lower endoscopy (such as a colonoscopy or flexible sigmoidoscopy) you may notice spotting of blood in your stool or on the toilet paper. If you underwent a bowel prep for your procedure, you may not have a normal bowel movement for a few days.  Please Note:  You might notice some irritation and congestion in your nose or some drainage.  This is from the oxygen used during your procedure.  There is no need for concern and it should clear up in a day or so.  SYMPTOMS TO REPORT IMMEDIATELY:   Following lower endoscopy (colonoscopy or flexible sigmoidoscopy):  Excessive amounts of blood in the stool  Significant tenderness or worsening of abdominal pains  Swelling of the abdomen that is new, acute  Fever of 100F or higher   Following upper endoscopy (EGD)  Vomiting of blood or coffee ground material  New chest pain or pain under the shoulder blades  Painful or persistently difficult swallowing  New shortness of breath  Fever of  100F or higher  Black, tarry-looking stools  For urgent or emergent issues, a gastroenterologist can be reached at any hour by calling [phone].   DIET:  We do recommend a small meal at first, but then you may proceed to your regular diet.  Drink plenty of fluids but you should avoid alcoholic beverages for 24 hours.  ACTIVITY:  You should plan to take it easy for the rest of today and you should NOT DRIVE or use heavy machinery until tomorrow (because of the sedation medicines used during the test).    FOLLOW UP: Our staff will call the number listed on your records the next business day following your procedure to check on you and address any questions or concerns that you may have regarding the information given to you following your procedure. If we do not reach you, we will leave a message.  However, if you are feeling well and you are not experiencing any problems, there is no need to return our call.  We will assume that you have returned to your regular daily activities without incident.  If any biopsies were taken you will be contacted by phone or by letter within the next 1-3 weeks.  Please call us at [phone] if you have not heard about the biopsies in 3 weeks.    SIGNATURES/CONFIDENTIALITY: You and/or your care partner have signed paperwork which will be entered into your electronic medical record.  These signatures attest to  the fact that that the information above on your After Visit Summary has been reviewed and is understood.  Full responsibility of the confidentiality of this discharge information lies with you and/or your care-partner.

## 2018-04-29 NOTE — Op Note (Signed)
[facility] Patient Name: Jessica Bell Procedure Date: 04/29/2018 1:40 PM MRN: [id number] Endoscopist: Wilhemina Bonito. [name] , MD Age: 57 Referring MD:  Date of Birth: 12/20/1960 Gender: Female Account #: [id number] Procedure:                Upper GI endoscopy with biopsies Indications:              Esophageal reflux. Also reports daughter had                            gastric cancer associated with Helicobacter pylori Medicines:                Monitored Anesthesia Care Procedure:                Pre-Anesthesia Assessment:                           - Prior to the procedure, a History and Physical                            was performed, and patient medications and                            allergies were reviewed. The patient's tolerance of                            previous anesthesia was also reviewed. The risks                            and benefits of the procedure and the sedation                            options and risks were discussed with the patient.                            All questions were answered, and informed consent                            was obtained. Prior Anticoagulants: The patient has                            taken Plavix (clopidogrel), last dose was 7 days                            prior to procedure. ASA Grade Assessment: III - A                            patient with severe systemic disease. After                            reviewing the risks and benefits, the patient was                            deemed in satisfactory condition to undergo the  procedure.                           After obtaining informed consent, the endoscope was                            passed under direct vision. Throughout the                            procedure, the patient's blood pressure, pulse, and                            oxygen saturations were monitored continuously. The                            Endoscope was introduced  through the mouth, and                            advanced to the second part of duodenum. The upper                            GI endoscopy was accomplished without difficulty.                            The patient tolerated the procedure well. Scope In: Scope Out: Findings:                 The esophagus was normal.                           The stomach was normal. Biopsies were taken with a                            cold forceps for Helicobacter pylori testing using                            CLOtest.                           The examined duodenum was normal.                           The cardia and gastric fundus were normal on                            retroflexion. Complications:            No immediate complications. Estimated Blood Loss:     Estimated blood loss: none. Impression:               - Normal esophagus.                           - Normal stomach. Biopsied.                           - Normal examined duodenum. Recommendation:           -  Patient has a contact number available for                            emergencies. The signs and symptoms of potential                            delayed complications were discussed with the                            patient. Return to normal activities tomorrow.                            Written discharge instructions were provided to the                            patient.                           - Resume previous diet.                           - Continue present medications including Plavix                            today.                           - Follow-up biopsies                           - Office follow-up with Dr. [name] in 4 to 6 weeks Wilhemina Bonito. [name], MD 04/29/2018 2:26:36 PM This report has been signed electronically.

## 2018-04-30 ENCOUNTER — Telehealth: Payer: Self-pay

## 2018-04-30 LAB — HELICOBACTER PYLORI SCREEN-BIOPSY: UREASE: NEGATIVE

## 2018-04-30 NOTE — Telephone Encounter (Signed)
  Follow up Call-  Call back number 04/29/2018  Post procedure Call Back phone  # [phone]  Permission to leave phone message Yes     Patient questions:  Do you have a fever, pain , or abdominal swelling? No. Pain Score  0 *  Have you tolerated food without any problems? Yes.    Have you been able to return to your normal activities? Yes.    Do you have any questions about your discharge instructions: Diet   No. Medications  No. Follow up visit  No.  Do you have questions or concerns about your Care? No.  Actions: * If pain score is 4 or above: No action needed, pain.

## 2018-05-02 ENCOUNTER — Emergency Department (HOSPITAL_COMMUNITY)
Admission: EM | Admit: 2018-05-02 | Discharge: 2018-05-02 | Disposition: A | Discharge status: Home / Self Care | Payer: Medicare Other | Attending: Emergency Medicine | Admitting: Emergency Medicine

## 2018-05-02 ENCOUNTER — Encounter (HOSPITAL_COMMUNITY): Payer: Self-pay | Admitting: Emergency Medicine

## 2018-05-02 ENCOUNTER — Other Ambulatory Visit: Payer: Self-pay

## 2018-05-02 DIAGNOSIS — Z7902 Long term (current) use of antithrombotics/antiplatelets: Secondary | ICD-10-CM | POA: Insufficient documentation

## 2018-05-02 DIAGNOSIS — Z79899 Other long term (current) drug therapy: Secondary | ICD-10-CM | POA: Insufficient documentation

## 2018-05-02 DIAGNOSIS — N390 Urinary tract infection, site not specified: Secondary | ICD-10-CM

## 2018-05-02 DIAGNOSIS — J45909 Unspecified asthma, uncomplicated: Secondary | ICD-10-CM | POA: Insufficient documentation

## 2018-05-02 DIAGNOSIS — I2510 Atherosclerotic heart disease of native coronary artery without angina pectoris: Secondary | ICD-10-CM | POA: Diagnosis not present

## 2018-05-02 DIAGNOSIS — I509 Heart failure, unspecified: Secondary | ICD-10-CM | POA: Insufficient documentation

## 2018-05-02 DIAGNOSIS — I110 Hypertensive heart disease with heart failure: Secondary | ICD-10-CM | POA: Diagnosis not present

## 2018-05-02 DIAGNOSIS — F17210 Nicotine dependence, cigarettes, uncomplicated: Secondary | ICD-10-CM | POA: Diagnosis not present

## 2018-05-02 DIAGNOSIS — R1032 Left lower quadrant pain: Secondary | ICD-10-CM | POA: Diagnosis present

## 2018-05-02 LAB — CBC WITH DIFFERENTIAL/PLATELET
Abs Immature Granulocytes: 0.01 10*3/uL (ref 0.00–0.07)
Basophils Absolute: 0.1 10*3/uL (ref 0.0–0.1)
Basophils Relative: 1 %
EOS ABS: 0.3 10*3/uL (ref 0.0–0.5)
Eosinophils Relative: 4 %
HCT: 40.4 % (ref 36.0–46.0)
Hemoglobin: 13.3 g/dL (ref 12.0–15.0)
Immature Granulocytes: 0 %
Lymphocytes Relative: 24 %
Lymphs Abs: 2 10*3/uL (ref 0.7–4.0)
MCH: 28.9 pg (ref 26.0–34.0)
MCHC: 32.9 g/dL (ref 30.0–36.0)
MCV: 87.8 fL (ref 80.0–100.0)
Monocytes Absolute: 0.6 10*3/uL (ref 0.1–1.0)
Monocytes Relative: 7 %
Neutro Abs: 5.3 10*3/uL (ref 1.7–7.7)
Neutrophils Relative %: 64 %
Platelets: 270 10*3/uL (ref 150–400)
RBC: 4.6 MIL/uL (ref 3.87–5.11)
RDW: 13.5 % (ref 11.5–15.5)
WBC: 8.3 10*3/uL (ref 4.0–10.5)
nRBC: 0 % (ref 0.0–0.2)

## 2018-05-02 LAB — BASIC METABOLIC PANEL
Anion gap: 9 (ref 5–15)
BUN: 12 mg/dL (ref 6–20)
CO2: 27 mmol/L (ref 22–32)
Calcium: 8.9 mg/dL (ref 8.9–10.3)
Chloride: 98 mmol/L (ref 98–111)
Creatinine, Ser: 0.96 mg/dL (ref 0.44–1.00)
GFR calc Af Amer: >60 mL/min (ref >60)
GFR calc non Af Amer: >60 mL/min (ref >60)
GLUCOSE: 92 mg/dL (ref 70–99)
Potassium: 3.1 mmol/L — ABNORMAL LOW (ref 3.5–5.1)
Sodium: 134 mmol/L — ABNORMAL LOW (ref 135–145)

## 2018-05-02 LAB — URINALYSIS, ROUTINE W REFLEX MICROSCOPIC
Bilirubin Urine: NEGATIVE
Glucose, UA: NEGATIVE mg/dL
Hgb urine dipstick: NEGATIVE
Ketones, ur: NEGATIVE mg/dL
Nitrite: NEGATIVE
PH: 7 (ref 5.0–8.0)
Protein, ur: NEGATIVE mg/dL
Specific Gravity, Urine: 1.006 (ref 1.005–1.030)

## 2018-05-02 MED ORDER — CEPHALEXIN 250 MG PO CAPS: 250.0000 mg | ORAL_CAPSULE | Freq: Two times a day (BID) | ORAL | 0 refills | Status: AC

## 2018-05-02 MED ORDER — CEPHALEXIN 500 MG PO CAPS
500.0000 mg | ORAL_CAPSULE | Freq: Once | ORAL | Status: AC
  Administered 2018-05-02: 500 mg via ORAL
  Filled 2018-05-02: qty 1

## 2018-05-02 MED ORDER — PHENAZOPYRIDINE HCL 100 MG PO TABS
100.0000 mg | ORAL_TABLET | Freq: Once | ORAL | Status: AC
  Administered 2018-05-02: 100 mg via ORAL
  Filled 2018-05-02: qty 1

## 2018-05-02 MED ORDER — PHENAZOPYRIDINE HCL 200 MG PO TABS: 200.0000 mg | ORAL_TABLET | Freq: Three times a day (TID) | ORAL | 0 refills | Status: DC

## 2018-05-02 NOTE — ED Triage Notes (Signed)
Patient complaining of left flank pain radiating into left lower abdomen since Monday. States she was doing prep for colonoscopy when pain started and pain has worsened this week. States nothing was found in colonoscopy. Complains of pressure to abdomen prior to urinating.
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
## 2018-05-02 NOTE — Discharge Instructions (Addendum)
Take the medications as prescribed, follow up with your doctor to be rechecked if the symptoms do not resolve

## 2018-05-05 ENCOUNTER — Encounter: Payer: Self-pay | Admitting: Internal Medicine

## 2018-05-29 ENCOUNTER — Encounter: Payer: Self-pay | Admitting: Internal Medicine

## 2018-05-29 ENCOUNTER — Ambulatory Visit (INDEPENDENT_AMBULATORY_CARE_PROVIDER_SITE_OTHER): Payer: Medicare Other | Admitting: Internal Medicine

## 2018-05-29 VITALS — BP 112/80 | HR 82 | Ht 63.0 in | Wt 180.2 lb

## 2018-05-29 DIAGNOSIS — Z800 Family history of malignant neoplasm of digestive organs: Secondary | ICD-10-CM | POA: Diagnosis not present

## 2018-05-29 DIAGNOSIS — K219 Gastro-esophageal reflux disease without esophagitis: Secondary | ICD-10-CM

## 2018-05-29 DIAGNOSIS — R109 Unspecified abdominal pain: Secondary | ICD-10-CM

## 2018-05-29 DIAGNOSIS — K580 Irritable bowel syndrome with diarrhea: Secondary | ICD-10-CM | POA: Diagnosis not present

## 2018-05-29 MED ORDER — DICYCLOMINE HCL 10 MG PO CAPS: ORAL_CAPSULE | ORAL | 0 refills | Status: DC

## 2018-05-29 NOTE — Patient Instructions (Signed)
We have sent the following medications to your pharmacy for you to pick up at your convenience:  Bentyl  Please follow up as needed  
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
## 2018-06-01 ENCOUNTER — Other Ambulatory Visit: Payer: Self-pay | Admitting: Gastroenterology

## 2018-07-23 ENCOUNTER — Telehealth: Payer: Self-pay | Admitting: Internal Medicine

## 2018-07-23 MED ORDER — PANTOPRAZOLE SODIUM 40 MG PO TBEC: 40.0000 mg | DELAYED_RELEASE_TABLET | Freq: Every day | ORAL | 6 refills | Status: DC

## 2018-07-23 NOTE — Telephone Encounter (Signed)
Pt is requesting rf for pantoprazole 40 mg sent to [facility] on [address] in [place], [place]. She stated that Omeprazole does not work for her so pls do not rf that one.

## 2018-07-23 NOTE — Telephone Encounter (Signed)
Refilled Pantoprazole 

## 2019-06-22 ENCOUNTER — Emergency Department (HOSPITAL_COMMUNITY): Payer: Medicare Other

## 2019-06-22 ENCOUNTER — Emergency Department (HOSPITAL_COMMUNITY)
Admission: EM | Admit: 2019-06-22 | Discharge: 2019-06-22 | Disposition: A | Discharge status: Home / Self Care | Payer: Medicare Other | Attending: Emergency Medicine | Admitting: Emergency Medicine

## 2019-06-22 ENCOUNTER — Other Ambulatory Visit: Payer: Self-pay

## 2019-06-22 ENCOUNTER — Encounter (HOSPITAL_COMMUNITY): Payer: Self-pay | Admitting: Radiology

## 2019-06-22 DIAGNOSIS — R197 Diarrhea, unspecified: Secondary | ICD-10-CM | POA: Diagnosis not present

## 2019-06-22 DIAGNOSIS — R0602 Shortness of breath: Secondary | ICD-10-CM | POA: Diagnosis present

## 2019-06-22 DIAGNOSIS — Z96643 Presence of artificial hip joint, bilateral: Secondary | ICD-10-CM | POA: Insufficient documentation

## 2019-06-22 DIAGNOSIS — I509 Heart failure, unspecified: Secondary | ICD-10-CM | POA: Insufficient documentation

## 2019-06-22 DIAGNOSIS — F17210 Nicotine dependence, cigarettes, uncomplicated: Secondary | ICD-10-CM | POA: Insufficient documentation

## 2019-06-22 DIAGNOSIS — J45901 Unspecified asthma with (acute) exacerbation: Secondary | ICD-10-CM | POA: Diagnosis not present

## 2019-06-22 DIAGNOSIS — K625 Hemorrhage of anus and rectum: Secondary | ICD-10-CM | POA: Diagnosis not present

## 2019-06-22 DIAGNOSIS — Z79899 Other long term (current) drug therapy: Secondary | ICD-10-CM | POA: Insufficient documentation

## 2019-06-22 DIAGNOSIS — I2510 Atherosclerotic heart disease of native coronary artery without angina pectoris: Secondary | ICD-10-CM | POA: Insufficient documentation

## 2019-06-22 DIAGNOSIS — Z20822 Contact with and (suspected) exposure to covid-19: Secondary | ICD-10-CM | POA: Insufficient documentation

## 2019-06-22 DIAGNOSIS — Z7901 Long term (current) use of anticoagulants: Secondary | ICD-10-CM | POA: Diagnosis not present

## 2019-06-22 DIAGNOSIS — I110 Hypertensive heart disease with heart failure: Secondary | ICD-10-CM | POA: Diagnosis not present

## 2019-06-22 DIAGNOSIS — I252 Old myocardial infarction: Secondary | ICD-10-CM | POA: Insufficient documentation

## 2019-06-22 LAB — HEPATIC FUNCTION PANEL
ALT: 20 U/L (ref 0–44)
AST: 21 U/L (ref 15–41)
Albumin: 4 g/dL (ref 3.5–5.0)
Alkaline Phosphatase: 81 U/L (ref 38–126)
Bilirubin, Direct: 0.1 mg/dL (ref 0.0–0.2)
Indirect Bilirubin: 0.4 mg/dL (ref 0.3–0.9)
Total Bilirubin: 0.5 mg/dL (ref 0.3–1.2)
Total Protein: 7 g/dL (ref 6.5–8.1)

## 2019-06-22 LAB — BASIC METABOLIC PANEL
Anion gap: 9 (ref 5–15)
BUN: 7 mg/dL (ref 6–20)
CO2: 28 mmol/L (ref 22–32)
Calcium: 8.8 mg/dL — ABNORMAL LOW (ref 8.9–10.3)
Chloride: 93 mmol/L — ABNORMAL LOW (ref 98–111)
Creatinine, Ser: 0.73 mg/dL (ref 0.44–1.00)
GFR calc Af Amer: >60 mL/min (ref >60)
GFR calc non Af Amer: >60 mL/min (ref >60)
Glucose, Bld: 99 mg/dL (ref 70–99)
Potassium: 3 mmol/L — ABNORMAL LOW (ref 3.5–5.1)
Sodium: 130 mmol/L — ABNORMAL LOW (ref 135–145)

## 2019-06-22 LAB — CBC WITH DIFFERENTIAL/PLATELET
Abs Immature Granulocytes: 0.02 10*3/uL (ref 0.00–0.07)
Basophils Absolute: 0 10*3/uL (ref 0.0–0.1)
Basophils Relative: 0 %
Eosinophils Absolute: 0.3 10*3/uL (ref 0.0–0.5)
Eosinophils Relative: 3 %
HCT: 37.1 % (ref 36.0–46.0)
Hemoglobin: 12.5 g/dL (ref 12.0–15.0)
Immature Granulocytes: 0 %
Lymphocytes Relative: 18 %
Lymphs Abs: 1.7 10*3/uL (ref 0.7–4.0)
MCH: 29.6 pg (ref 26.0–34.0)
MCHC: 33.7 g/dL (ref 30.0–36.0)
MCV: 87.9 fL (ref 80.0–100.0)
Monocytes Absolute: 0.8 10*3/uL (ref 0.1–1.0)
Monocytes Relative: 8 %
Neutro Abs: 6.9 10*3/uL (ref 1.7–7.7)
Neutrophils Relative %: 71 %
Platelets: 278 10*3/uL (ref 150–400)
RBC: 4.22 MIL/uL (ref 3.87–5.11)
RDW: 14.4 % (ref 11.5–15.5)
WBC: 9.8 10*3/uL (ref 4.0–10.5)
nRBC: 0 % (ref 0.0–0.2)

## 2019-06-22 LAB — SAMPLE TO BLOOD BANK

## 2019-06-22 LAB — POC SARS CORONAVIRUS 2 AG -  ED: SARS Coronavirus 2 Ag: NEGATIVE

## 2019-06-22 IMAGING — DX DG CHEST 1V PORT
1 series · 1 of 1 positions shown · non-contrast
Comparison: None.

CLINICAL DATA: Short of breath, wheezing, diarrhea

EXAM:
PORTABLE CHEST 1 VIEW

[chest ap]
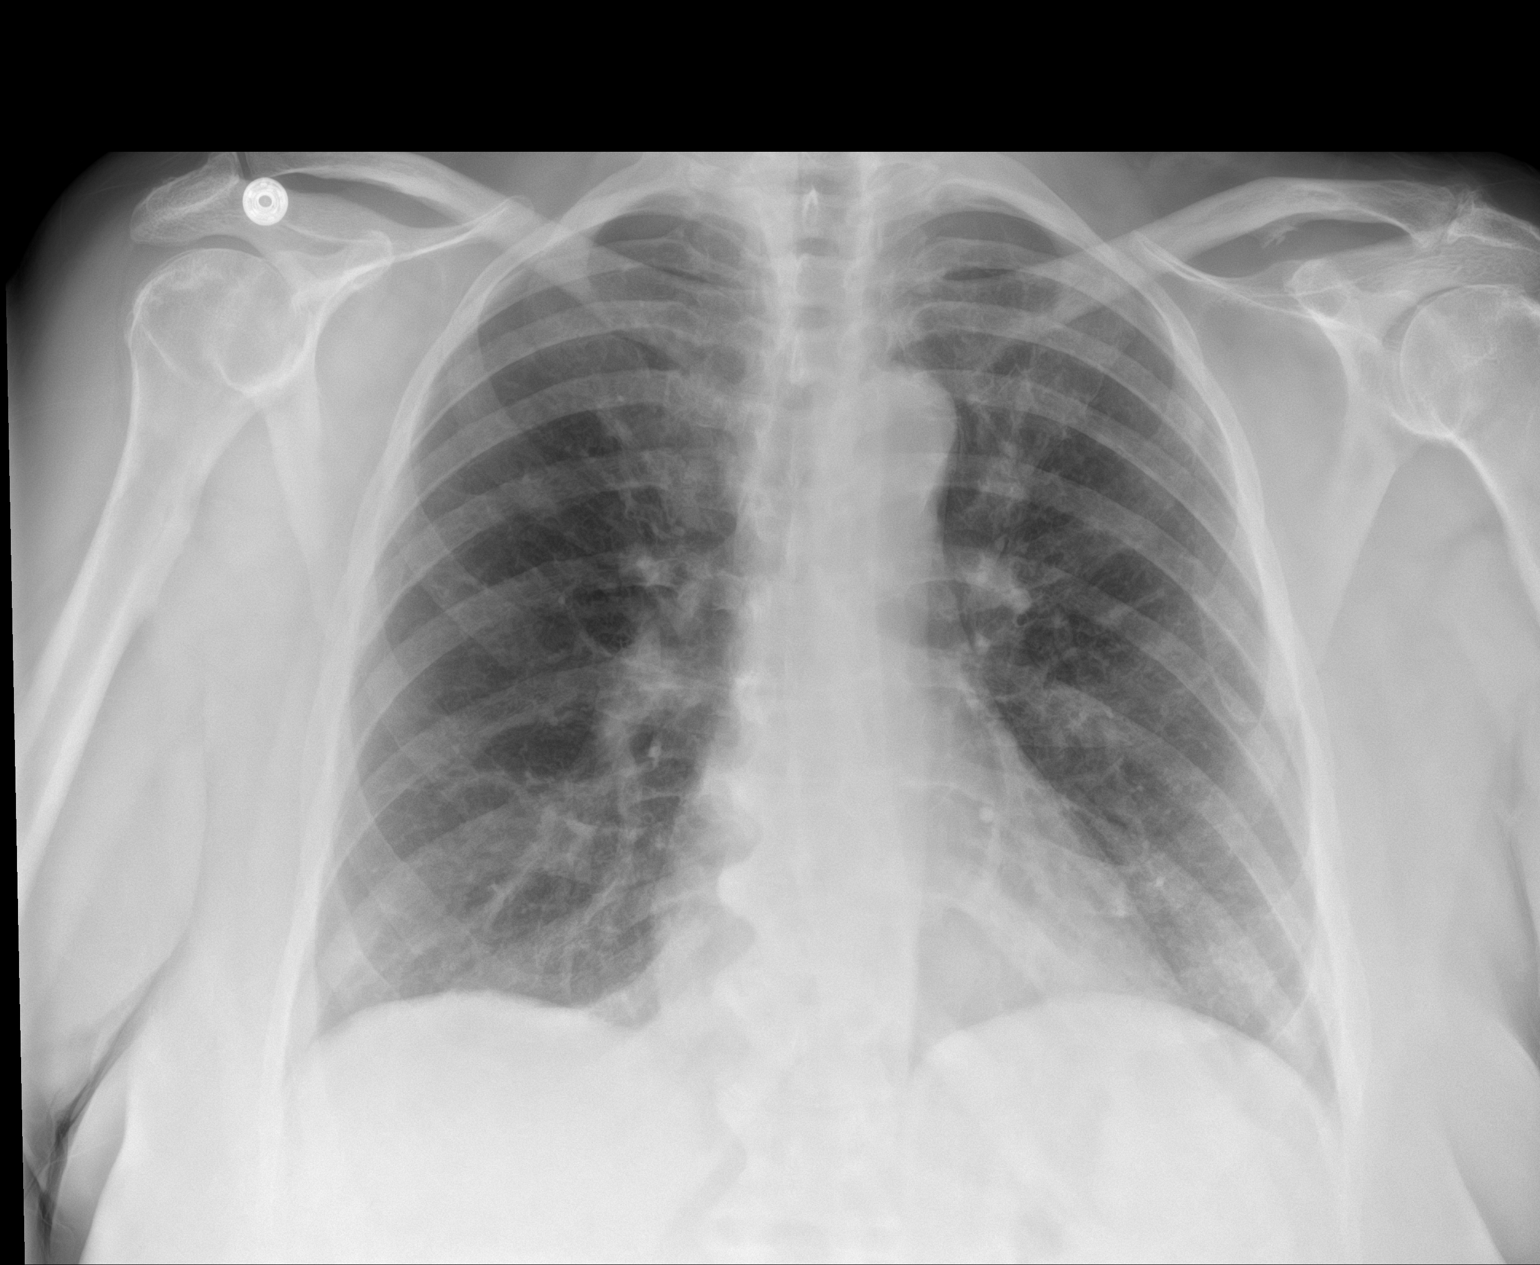

[1 of 1 positions shown; findings below may reference images not displayed]

FINDINGS: Single frontal view of the chest demonstrates an unremarkable
cardiac silhouette. There is diffuse bronchovascular prominence of
uncertain chronicity. No focal consolidation, effusion, or
pneumothorax.
IMPRESSION: 1. Diffuse bronchovascular prominence of uncertain chronicity. This
could reflect chronic interstitial changes and scarring, reactive
airway disease, or a viral pneumonitis.
2. No lobar pneumonia.

## 2019-06-22 MED ORDER — MORPHINE SULFATE (PF) 4 MG/ML IV SOLN
4.0000 mg | Freq: Once | INTRAVENOUS | Status: AC
  Administered 2019-06-22: 4 mg via INTRAVENOUS
  Filled 2019-06-22: qty 1

## 2019-06-22 MED ORDER — ONDANSETRON HCL 4 MG/2ML IJ SOLN
4.0000 mg | Freq: Once | INTRAMUSCULAR | Status: AC
  Administered 2019-06-22: 4 mg via INTRAVENOUS
  Filled 2019-06-22: qty 2

## 2019-06-22 MED ORDER — AEROCHAMBER Z-STAT PLUS/MEDIUM MISC
1.0000 | Freq: Once | Status: AC
  Administered 2019-06-22: 1

## 2019-06-22 MED ORDER — SODIUM CHLORIDE 0.9 % IV BOLUS
1000.0000 mL | Freq: Once | INTRAVENOUS | Status: AC
  Administered 2019-06-22: 17:00:00 1000 mL via INTRAVENOUS

## 2019-06-22 MED ORDER — ALBUTEROL SULFATE HFA 108 (90 BASE) MCG/ACT IN AERS
2.0000 | INHALATION_SPRAY | Freq: Once | RESPIRATORY_TRACT | Status: AC
  Administered 2019-06-22: 2 via RESPIRATORY_TRACT
  Filled 2019-06-22: qty 6.7

## 2019-06-22 MED ORDER — PREDNISONE 20 MG PO TABS: 40.0000 mg | ORAL_TABLET | Freq: Every day | ORAL | 0 refills | Status: DC
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
## 2019-06-22 NOTE — ED Provider Notes (Signed)
   Pt signed out to me by Jessica Amor, PA-C pend completion of work up  Jessica Bell is a 59 year old female who comes in with persistent wheezing and shortness of breath for 1 week.  She developed watery brown diarrhea 3 days ago.  Reports having "blood clots" in her stool yesterday.  She had a negative Covid test from her PCPs office recently and negative antigen test here today.  She reports history of diverticulitis and IBS.  She is followed by gastroenterology in [place] and was seen by Dr. [name] in January.   covid neg 2 weeks ago  She was found to have heme positive stool on rectal exam and she has reports pain of lower abdominal pain.  Her differential diagnosis would include C. difficile and diverticulitis.  Labs Reviewed  BASIC METABOLIC PANEL - Abnormal; Notable for the following components:      Result Value   Sodium 130 (*)    Potassium 3.0 (*)    Chloride 93 (*)    Calcium 8.8 (*)    All other components within normal limits  C DIFFICILE QUICK SCREEN W PCR REFLEX  CBC WITH DIFFERENTIAL/PLATELET  HEPATIC FUNCTION PANEL  POC SARS CORONAVIRUS 2 AG -  ED  SAMPLE TO BLOOD BANK   DG Chest Portable 1 View  Result Date: 06/22/2019 CLINICAL DATA:  Short of breath, wheezing, diarrhea EXAM: PORTABLE CHEST 1 VIEW COMPARISON:  None. FINDINGS: Single frontal view of the chest demonstrates an unremarkable cardiac silhouette. There is diffuse bronchovascular prominence of uncertain chronicity. No focal consolidation, effusion, or pneumothorax. IMPRESSION: 1. Diffuse bronchovascular prominence of uncertain chronicity. This could reflect chronic interstitial changes and scarring, reactive airway disease, or a viral pneumonitis. 2. No lobar pneumonia. Electronically Signed   By: Sharlet Salina M.D.   On: 06/22/2019 16:29   On my exam, Jessica Bell is resting comfortably, vital signs are reassuring and Jessica Bell reports that she is feeling better and is ready for discharge home.  She is tolerating oral  fluids.  Lungs are CTAB.  Given that she does have a heme positive stool here and abd pain, I have recommended CT of her abdomen and pelvis to further evaluate for possible diverticulitis or ruptured diverticuli. Abdominal is soft with minimal tenderness of the lower abdomen.  Jessica Bell declines stating that she is feeling better and would like to go home, but agrees to return if her symptoms worsen.  Discussed risks.  Vitals are stable and she is non-toxic appearing.  She also agrees to follow-up with her GI provider. Return precautions discussed   Pauline Aus, PA-C 06/23/19 1324    Bethann Berkshire, MD 06/23/19 2133

## 2019-06-22 NOTE — ED Triage Notes (Signed)
Treated for bronchitis for 6 six and not any better.  Three days ago started with diarrhea.Started with blood in stool on yesterday and today, with some clots. c/o side rib pain and pain and swelling BLE about 2 days ago.

## 2019-06-22 NOTE — Discharge Instructions (Addendum)
Continue using her albuterol inhaler, 1 to 2 puffs every 4-6 hours as needed.  Start the prednisone prescription today.  Also, you will need close follow-up with your primary care provider or return to the ER if you change your mind and would like to have the CT of your abdomen and pelvis done to evaluate you for diverticulitis.  Also return if you develop worsening abdominal pain, bloody stools, fever, or chills,

## 2019-06-23 ENCOUNTER — Telehealth: Payer: Self-pay | Admitting: Internal Medicine

## 2019-06-23 NOTE — Telephone Encounter (Signed)
Pt reported that she was in the ED for bronchitis but she has been experiencing severe diarrhea.  Pt thinks that she is having a diverticulitis flare.

## 2019-06-23 NOTE — Telephone Encounter (Signed)
Pt reports she had bad bronchitis and was treated with augmentin. States this bothered her stomach and she was given another antibiotic, she could not recall the name.  Now pt states she is having lower abd pain and bloating along with diarrhea. Reports when she eats something it runs straight through her. She was seen in the ER at [facility] yesterday. Pt thinks she is having a diverticulitis flare and wanted to know if antibiotics can be called in for her. Please advise.

## 2019-06-23 NOTE — Telephone Encounter (Signed)
Stool for C. Diff ASAP CT abdomen and pelvis "lower abdominal pain, r/o diverticulitis" ASAP GI office visit after these competed (me or APP)   I did review the ER evaluation

## 2019-06-24 NOTE — Telephone Encounter (Signed)
Spoke with pt and let her know Dr. Lamar Sprinkles recommendations. States that she was able to eat yesterday and this morning and has not had any more pain or diarrhea. Pt thinks she is much better and does not think she needs to do anything further. Reports is she starts having trouble she will call back to schedule an appointment and move forward with recommendations below. Dr. [name] aware.

## 2019-06-24 NOTE — Telephone Encounter (Signed)
Glad to hear of the remarkably quick turnaround.  Thanks for the update

## 2019-08-17 ENCOUNTER — Other Ambulatory Visit: Payer: Self-pay | Admitting: Internal Medicine

## 2020-04-23 ENCOUNTER — Other Ambulatory Visit: Payer: Self-pay

## 2020-04-23 ENCOUNTER — Emergency Department (HOSPITAL_COMMUNITY)
Admission: EM | Admit: 2020-04-23 | Discharge: 2020-04-23 | Discharge status: Against Medical Advice | Payer: Medicare Other

## 2020-05-23 ENCOUNTER — Emergency Department (HOSPITAL_COMMUNITY): Payer: Medicare Other

## 2020-05-23 ENCOUNTER — Other Ambulatory Visit: Payer: Self-pay

## 2020-05-23 ENCOUNTER — Emergency Department (HOSPITAL_COMMUNITY)
Admission: EM | Admit: 2020-05-23 | Discharge: 2020-05-23 | Disposition: A | Discharge status: Home / Self Care | Payer: Medicare Other | Attending: Emergency Medicine | Admitting: Emergency Medicine

## 2020-05-23 ENCOUNTER — Encounter (HOSPITAL_COMMUNITY): Payer: Self-pay | Admitting: Emergency Medicine

## 2020-05-23 DIAGNOSIS — I509 Heart failure, unspecified: Secondary | ICD-10-CM | POA: Diagnosis not present

## 2020-05-23 DIAGNOSIS — F17210 Nicotine dependence, cigarettes, uncomplicated: Secondary | ICD-10-CM | POA: Diagnosis not present

## 2020-05-23 DIAGNOSIS — Z96643 Presence of artificial hip joint, bilateral: Secondary | ICD-10-CM | POA: Diagnosis not present

## 2020-05-23 DIAGNOSIS — I110 Hypertensive heart disease with heart failure: Secondary | ICD-10-CM | POA: Insufficient documentation

## 2020-05-23 DIAGNOSIS — Z79899 Other long term (current) drug therapy: Secondary | ICD-10-CM | POA: Diagnosis not present

## 2020-05-23 DIAGNOSIS — Z20822 Contact with and (suspected) exposure to covid-19: Secondary | ICD-10-CM | POA: Diagnosis not present

## 2020-05-23 DIAGNOSIS — M7989 Other specified soft tissue disorders: Secondary | ICD-10-CM

## 2020-05-23 DIAGNOSIS — R0602 Shortness of breath: Secondary | ICD-10-CM | POA: Diagnosis present

## 2020-05-23 DIAGNOSIS — R2243 Localized swelling, mass and lump, lower limb, bilateral: Secondary | ICD-10-CM | POA: Diagnosis not present

## 2020-05-23 DIAGNOSIS — I2510 Atherosclerotic heart disease of native coronary artery without angina pectoris: Secondary | ICD-10-CM | POA: Diagnosis not present

## 2020-05-23 DIAGNOSIS — J40 Bronchitis, not specified as acute or chronic: Secondary | ICD-10-CM | POA: Insufficient documentation

## 2020-05-23 LAB — BASIC METABOLIC PANEL
Anion gap: 10 (ref 5–15)
BUN: 5 mg/dL — ABNORMAL LOW (ref 6–20)
CO2: 30 mmol/L (ref 22–32)
Calcium: 9.2 mg/dL (ref 8.9–10.3)
Chloride: 92 mmol/L — ABNORMAL LOW (ref 98–111)
Creatinine, Ser: 0.75 mg/dL (ref 0.44–1.00)
GFR, Estimated: >60 mL/min (ref >60)
Glucose, Bld: 96 mg/dL (ref 70–99)
Potassium: 3 mmol/L — ABNORMAL LOW (ref 3.5–5.1)
Sodium: 132 mmol/L — ABNORMAL LOW (ref 135–145)

## 2020-05-23 LAB — RESPIRATORY PANEL BY PCR

## 2020-05-23 LAB — RESP PANEL BY RT-PCR (FLU A&B, COVID) ARPGX2
Influenza A by PCR: NEGATIVE
Influenza B by PCR: NEGATIVE
SARS Coronavirus 2 by RT PCR: NEGATIVE

## 2020-05-23 LAB — HEPATIC FUNCTION PANEL
ALT: 18 U/L (ref 0–44)
AST: 22 U/L (ref 15–41)
Albumin: 4 g/dL (ref 3.5–5.0)
Alkaline Phosphatase: 87 U/L (ref 38–126)
Bilirubin, Direct: 0.1 mg/dL (ref 0.0–0.2)
Indirect Bilirubin: 0.4 mg/dL (ref 0.3–0.9)
Total Bilirubin: 0.5 mg/dL (ref 0.3–1.2)
Total Protein: 7.1 g/dL (ref 6.5–8.1)

## 2020-05-23 LAB — CBC
HCT: 41.3 % (ref 36.0–46.0)
Hemoglobin: 14.1 g/dL (ref 12.0–15.0)
MCH: 30.1 pg (ref 26.0–34.0)
MCHC: 34.1 g/dL (ref 30.0–36.0)
MCV: 88.1 fL (ref 80.0–100.0)
Platelets: 320 10*3/uL (ref 150–400)
RBC: 4.69 MIL/uL (ref 3.87–5.11)
RDW: 13.3 % (ref 11.5–15.5)
WBC: 7 10*3/uL (ref 4.0–10.5)
nRBC: 0 % (ref 0.0–0.2)

## 2020-05-23 LAB — TROPONIN I (HIGH SENSITIVITY)
Troponin I (High Sensitivity): 4 ng/L (ref <18)
Troponin I (High Sensitivity): 3 ng/L (ref <18)

## 2020-05-23 LAB — BRAIN NATRIURETIC PEPTIDE: B Natriuretic Peptide: 163 pg/mL — ABNORMAL HIGH (ref 0.0–100.0)

## 2020-05-23 IMAGING — CT CT ANGIO CHEST
2 of 6 series · 19 of 36 positions shown · IV contrast (Omnipaque or Isovue)
Comparison: Same day chest radiograph.

CLINICAL DATA: Chest pain and shortness of breath PE suspected.

EXAM:
CT ANGIOGRAPHY CHEST WITH CONTRAST
TECHNIQUE: Multidetector CT imaging of the chest was performed using the
standard protocol during bolus administration of intravenous
contrast. Multiplanar CT image reconstructions and MIPs were
obtained to evaluate the vascular anatomy.
CONTRAST:  100mL OMNIPAQUE IOHEXOL 350 MG/ML SOLN

[Series 7: pe axial thins · axial · 0.63mm/px · z∈[+1349,+1646]mm · 18 of 331 slices shown]
[im 17/331  lung]
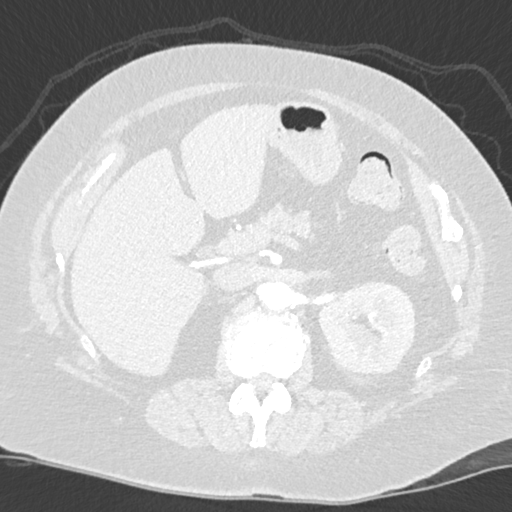
[im 34/331  mediastinal]
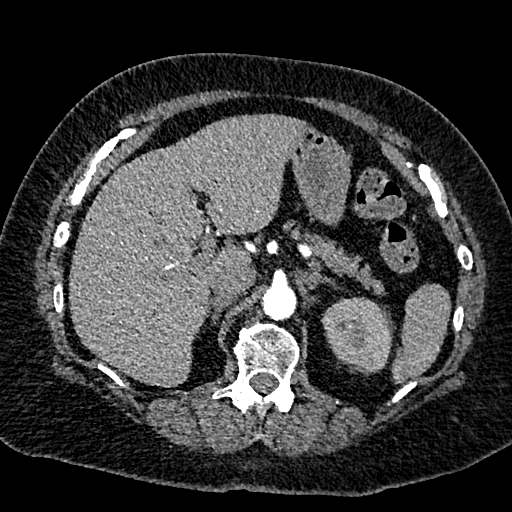
[im 50/331  lung]
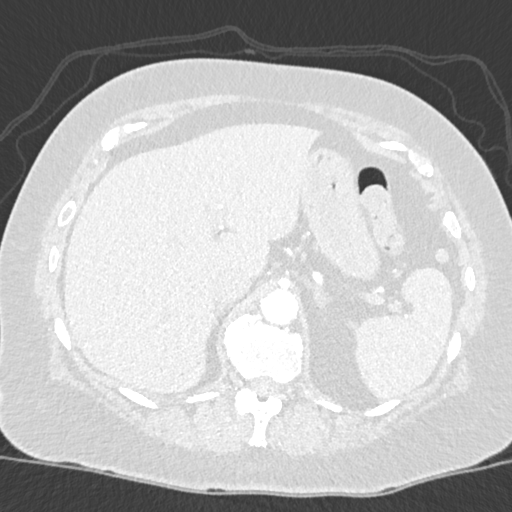
[im 67/331  mediastinal]
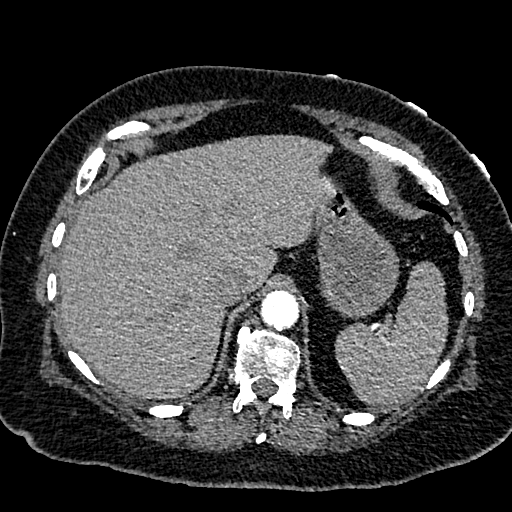
[im 83/331  lung]
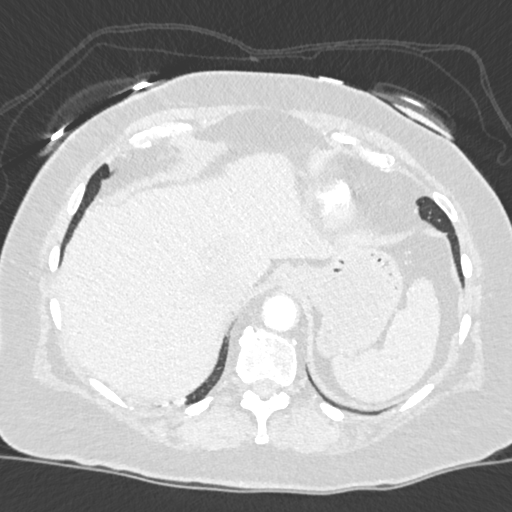
[im 100/331  mediastinal]
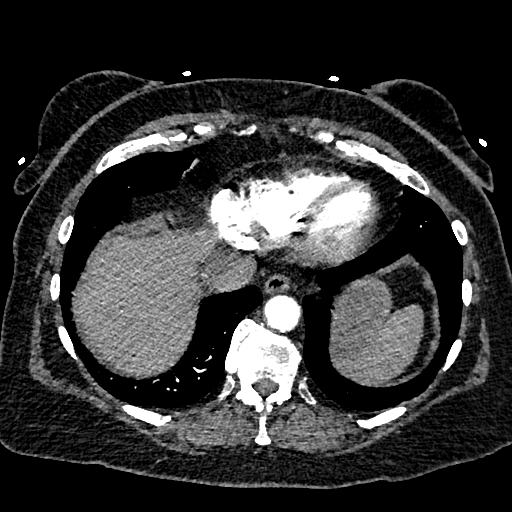
[im 116/331  lung]
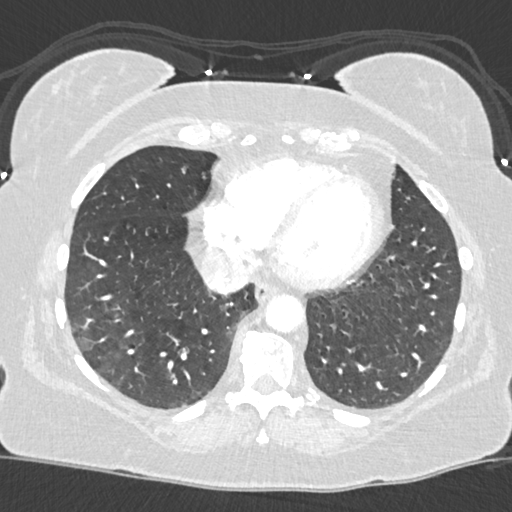
[im 133/331  mediastinal]
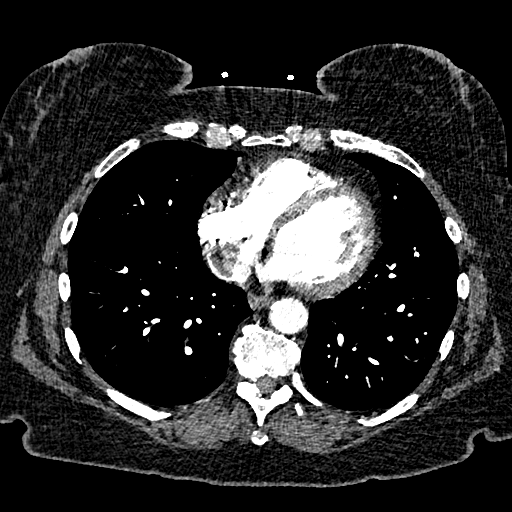
[im 149/331  lung]
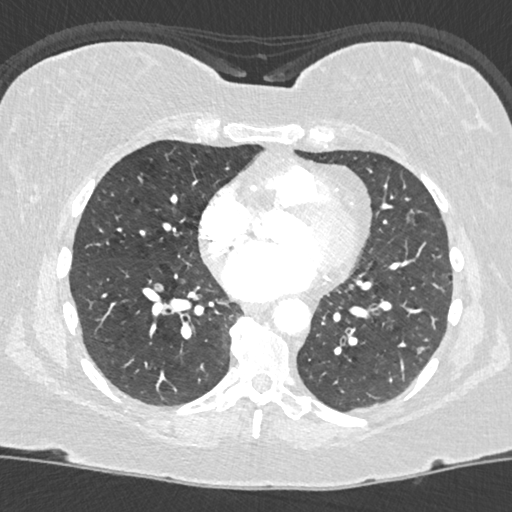
[im 182/331  mediastinal]
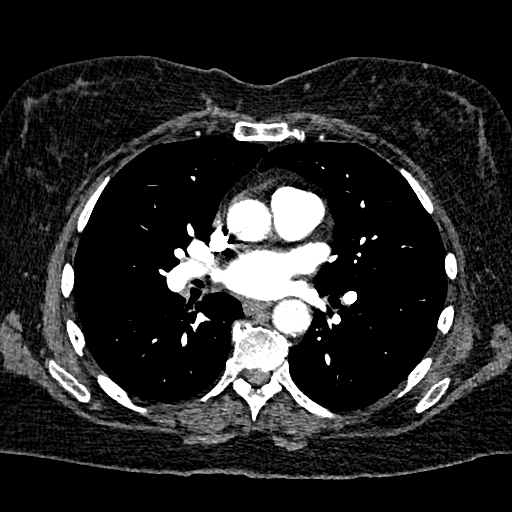
[im 199/331  lung]
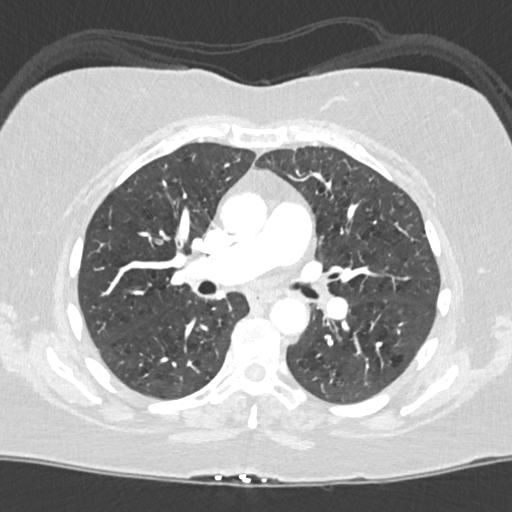
[im 215/331  mediastinal]
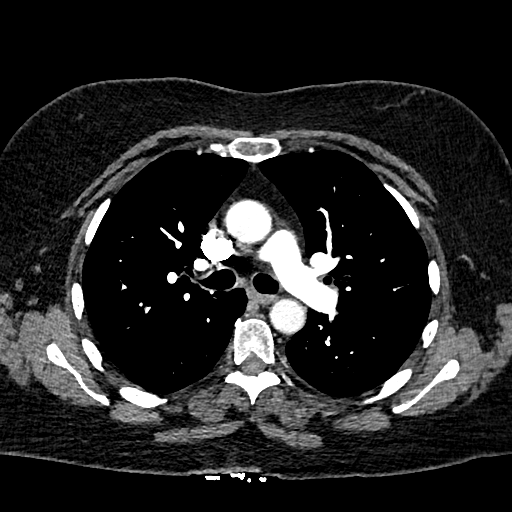
[im 232/331  lung]
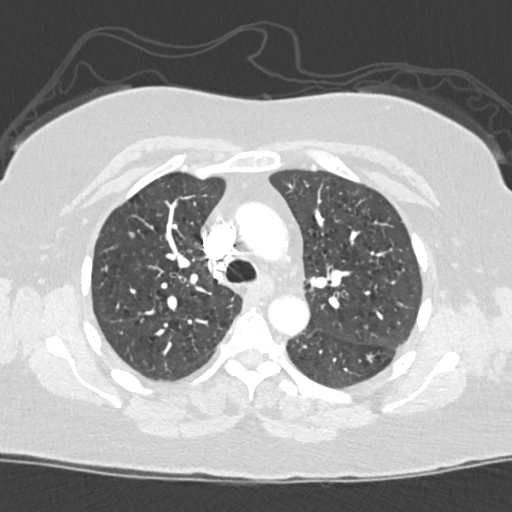
[im 248/331  mediastinal]
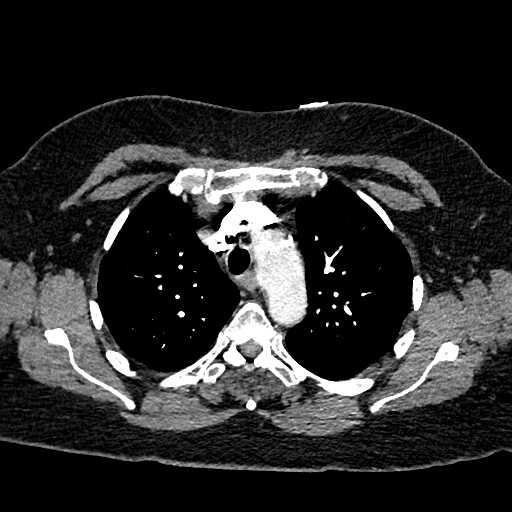
[im 265/331  lung]
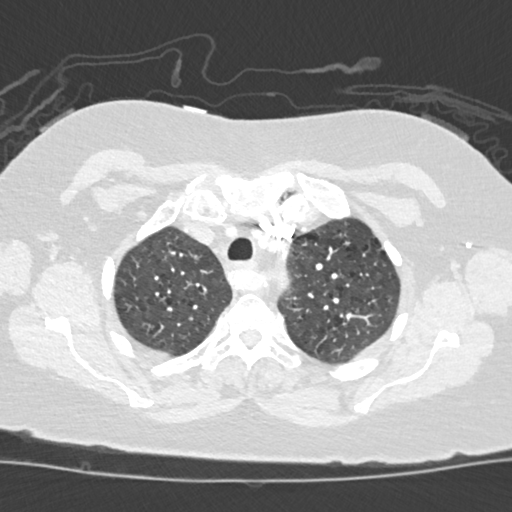
[im 281/331  mediastinal]
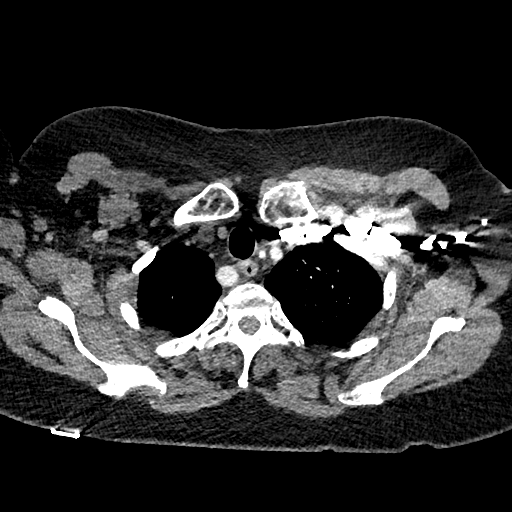
[im 298/331  lung]
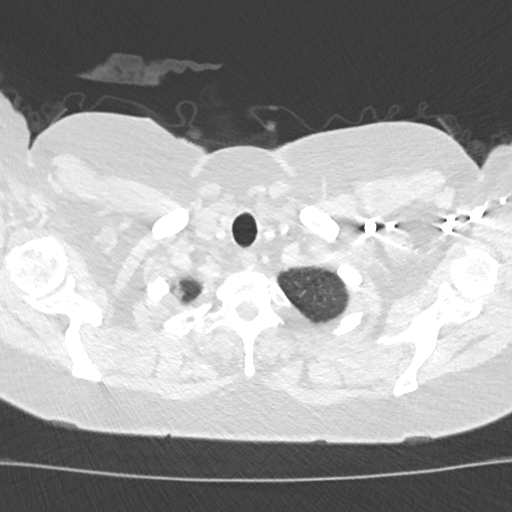
[im 314/331  mediastinal]
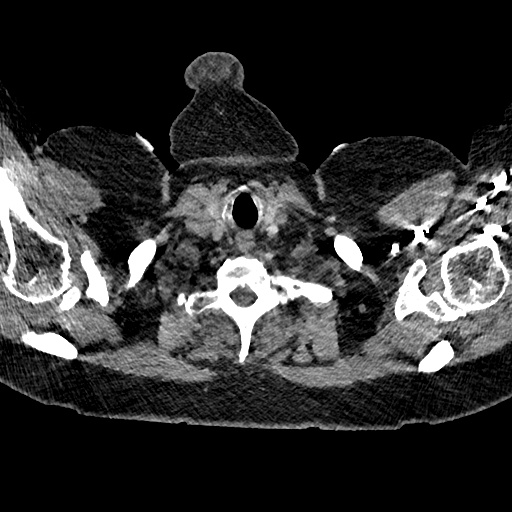

[Series 9: cor soft · coronal · 0.64mm/px · 1 of 111 slices shown]
[im 56/111  mediastinal]
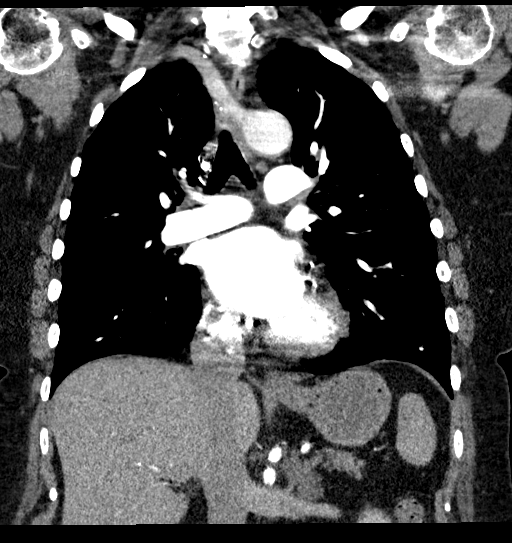

[19 of 36 positions shown; findings below may reference images not displayed]

FINDINGS: Cardiovascular: Satisfactory opacification of the pulmonary arteries
to the segmental level. No evidence of pulmonary embolism. Normal
heart size. No pericardial effusion. Aortic atherosclerosis.

Mediastinum/Nodes: No enlarged mediastinal, hilar, or axillary lymph
nodes. Thyroid gland, trachea, and esophagus demonstrate no
significant findings.

Lungs/Pleura: Apical predominant emphysematous change. Mosaic
attenuation of the lungs. Diffuse bronchial wall thickening. Which
she. Subsegmental atelectasis in the left lower lobe (series 8,
image 104).

Upper Abdomen: No acute abnormality.

Musculoskeletal: No chest wall abnormality. No acute or significant
osseous findings.

Review of the MIP images confirms the above findings.
IMPRESSION: 1. Negative examination for pulmonary embolism.
2. Diffuse bronchial wall thickening and mosaic attenuation of the
lungs, consistent with nonspecific infectious or inflammatory
bronchitis.
3. Emphysema
4. Aortic atherosclerosis.

Aortic Atherosclerosis ([U2]-[U2]) and Emphysema ([U2]-[U2]).

## 2020-05-23 IMAGING — DX DG CHEST 2V
2 series · 2 of 2 positions shown · non-contrast
Comparison: [DATE]

CLINICAL DATA: Chest pain

EXAM:
CHEST - 2 VIEW

[chest pa]
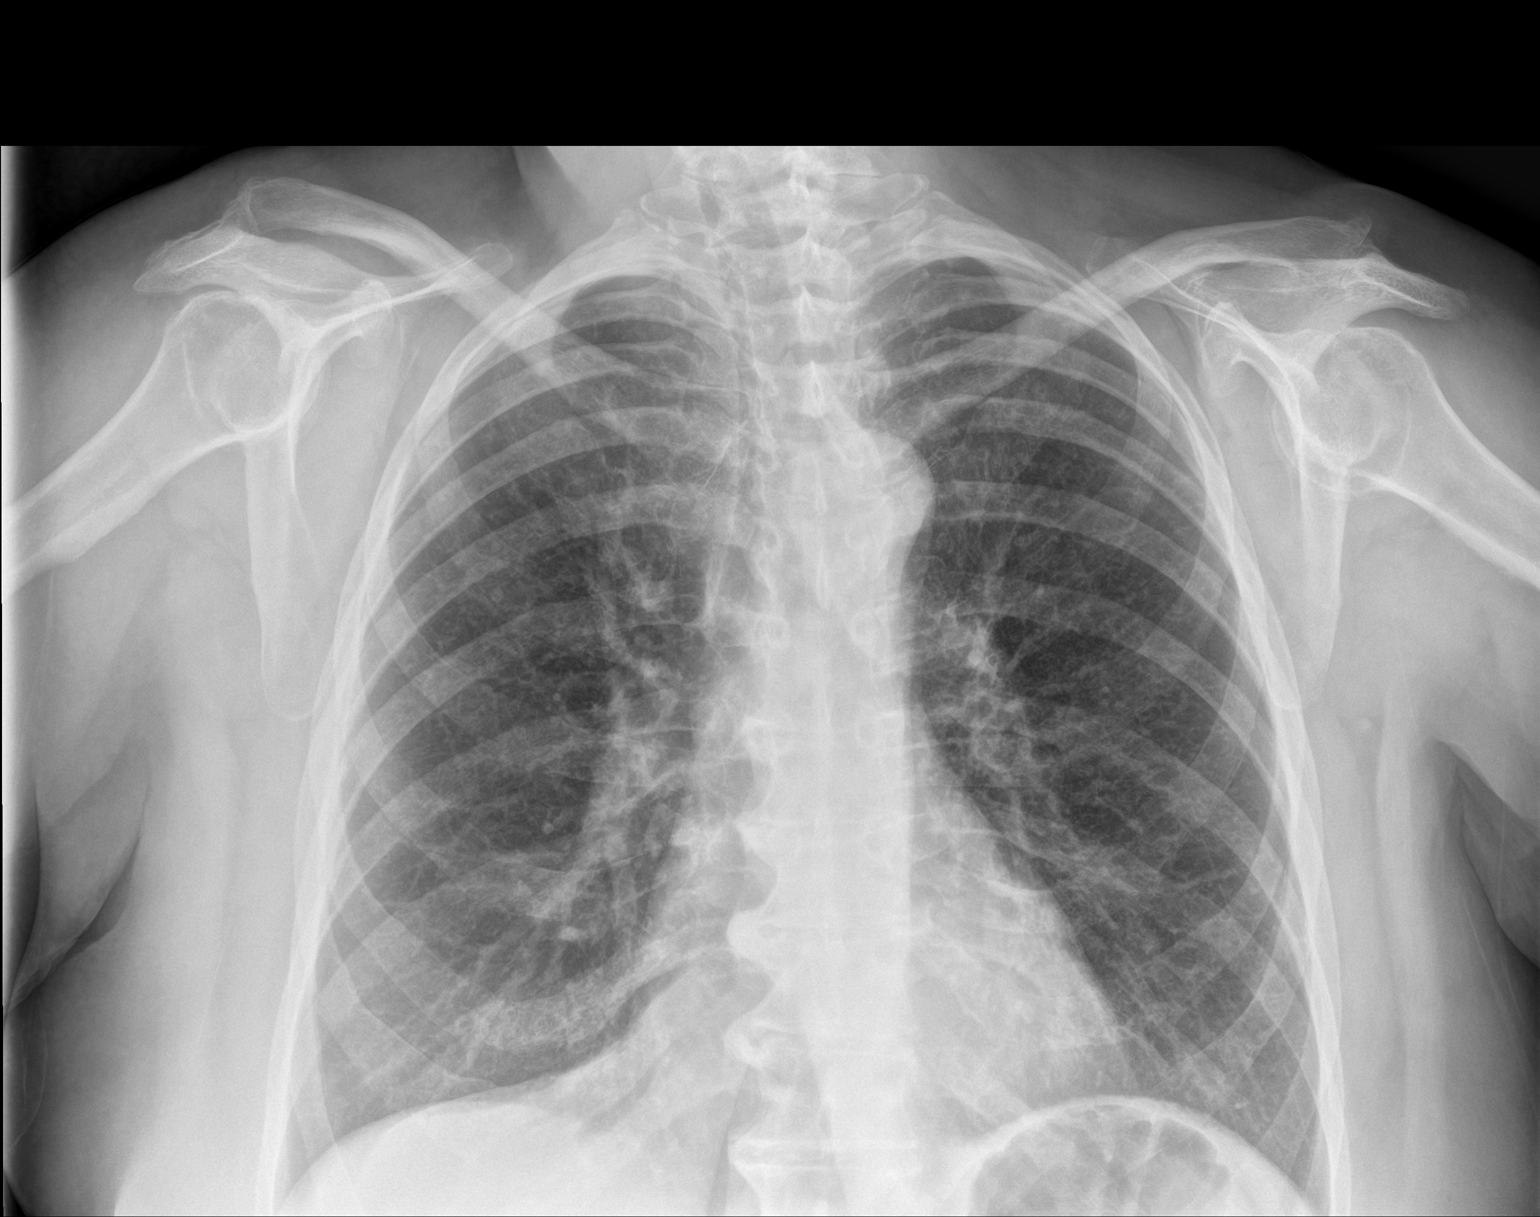

[chest lat]
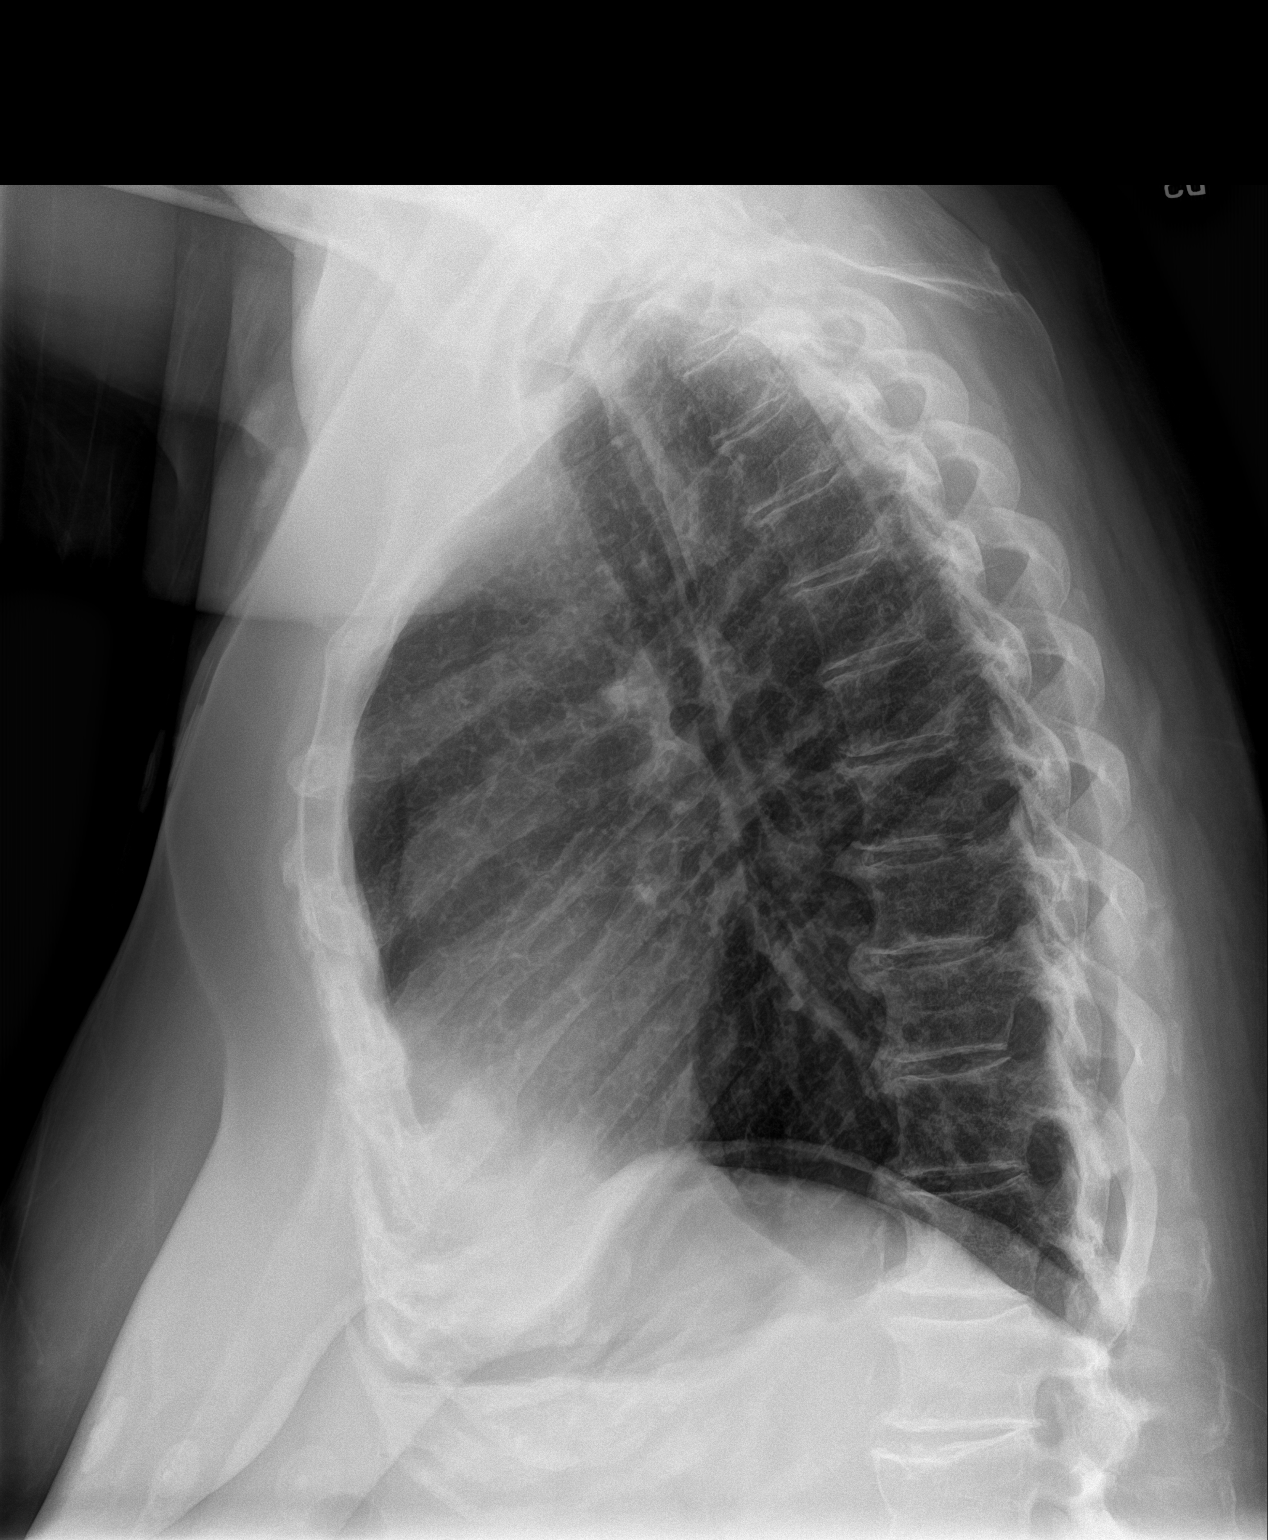

[2 of 2 positions shown; findings below may reference images not displayed]

FINDINGS: There is mild central peribronchial thickening. Lungs otherwise are
clear. Heart size and pulmonary vascularity are normal. No
adenopathy. There is degenerative change in the thoracic spine
IMPRESSION: Central peribronchial thickening likely represents chronic
bronchiolitis. Lungs elsewhere clear. Heart size normal. No evident
adenopathy.

## 2020-05-23 MED ORDER — DOXYCYCLINE HYCLATE 100 MG PO TABS
100.0000 mg | ORAL_TABLET | Freq: Once | ORAL | Status: AC
  Administered 2020-05-23: 100 mg via ORAL
  Filled 2020-05-23: qty 1

## 2020-05-23 MED ORDER — IOHEXOL 350 MG/ML SOLN
100.0000 mL | Freq: Once | INTRAVENOUS | Status: AC | PRN
  Administered 2020-05-23: 100 mL via INTRAVENOUS

## 2020-05-23 MED ORDER — BUDESONIDE-FORMOTEROL FUMARATE 160-4.5 MCG/ACT IN AERO: 1.0000 | INHALATION_SPRAY | Freq: Two times a day (BID) | RESPIRATORY_TRACT | 12 refills | Status: DC

## 2020-05-23 MED ORDER — DOXYCYCLINE HYCLATE 100 MG PO CAPS: 100.0000 mg | ORAL_CAPSULE | Freq: Two times a day (BID) | ORAL | 0 refills | Status: AC

## 2020-05-23 NOTE — Discharge Instructions (Addendum)
Please read instructions below. Use your albuterol nebulizer every 4-6 hours as needed for shortness of breath or wheezing.  Use the symbicort inhaler twice daily. Starting tomorrow morning, take the antibiotic, doxycycline, every 12 hours until gone. Elevate your legs is much as possible.  It is also recommended you wear compression stockings to help with swelling. It is recommended you begin taking a probiotic daily to help maintain healthy gut flora.  You can continue taking this regularly after your illness if desired. Follow closely with your primary care provider. It is recommended to establish care with pulmonology, your PCP can refer you. Return to the ER if you have worsening shortness of breath not improved by your inhaler, new chest pain, or new or worsening symptoms.
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
## 2020-05-23 NOTE — ED Triage Notes (Signed)
Pt c/o shortness of breath and chest pain with a diagnosis of PNU.

## 2020-08-17 ENCOUNTER — Other Ambulatory Visit: Payer: Self-pay | Admitting: Internal Medicine

## 2020-10-13 ENCOUNTER — Ambulatory Visit: Payer: Medicare Other | Admitting: Nurse Practitioner

## 2020-11-10 ENCOUNTER — Ambulatory Visit: Payer: Medicare Other | Admitting: Nurse Practitioner

## 2020-12-10 ENCOUNTER — Other Ambulatory Visit: Payer: Self-pay | Admitting: Internal Medicine

## 2020-12-13 ENCOUNTER — Ambulatory Visit: Payer: Medicare Other | Admitting: Gastroenterology

## 2021-01-30 ENCOUNTER — Other Ambulatory Visit: Payer: Self-pay | Admitting: Physician Assistant

## 2021-01-30 ENCOUNTER — Other Ambulatory Visit (HOSPITAL_COMMUNITY): Payer: Self-pay | Admitting: Physician Assistant

## 2021-01-30 DIAGNOSIS — M542 Cervicalgia: Secondary | ICD-10-CM

## 2021-02-14 ENCOUNTER — Other Ambulatory Visit: Payer: Self-pay

## 2021-02-14 ENCOUNTER — Ambulatory Visit (HOSPITAL_COMMUNITY)
Admission: RE | Admit: 2021-02-14 | Discharge: 2021-02-14 | Disposition: A | Discharge status: Home / Self Care | Payer: Medicare Other | Source: Ambulatory Visit | Attending: Physician Assistant | Admitting: Physician Assistant

## 2021-02-14 DIAGNOSIS — M542 Cervicalgia: Secondary | ICD-10-CM | POA: Diagnosis present

## 2021-02-14 IMAGING — MR MR CERVICAL SPINE W/O CM
5 series · 39 of 48 positions shown · non-contrast
Comparison: Radiographs [DATE].

CLINICAL DATA: Cervical pain.

EXAM:
MRI CERVICAL SPINE WITHOUT CONTRAST
TECHNIQUE: Multiplanar, multisequence MR imaging of the cervical spine was
performed. No intravenous contrast was administered.

[Series 5: T2 · sagittal · 3.0mm · 0.69mm/px · 6 of 15 slices shown (1 of 2)]
[im 1/15]
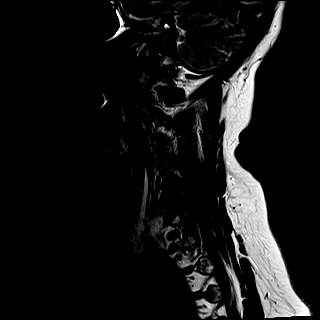
[im 3/15]
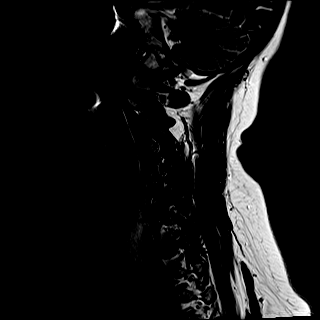
[im 6/15]
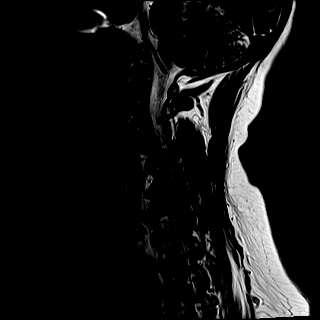
[im 9/15]
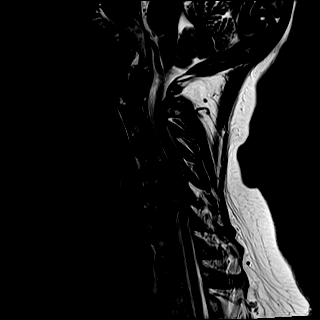
[im 12/15]
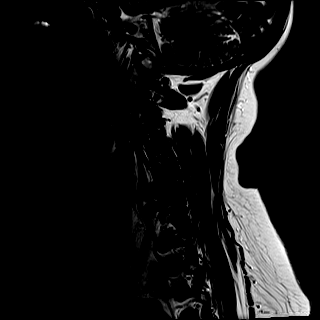
[im 15/15]
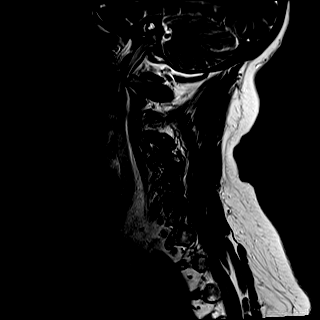

[Series 6: T1 · sagittal · 3.0mm · 0.86mm/px · 6 of 15 slices shown]
[im 1/15]
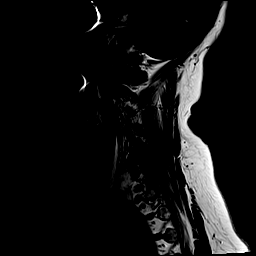
[im 3/15]
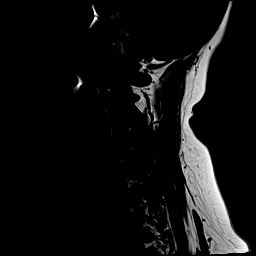
[im 6/15]
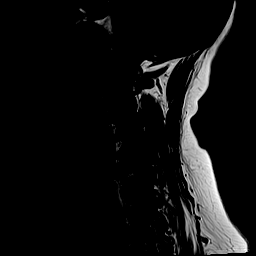
[im 9/15]
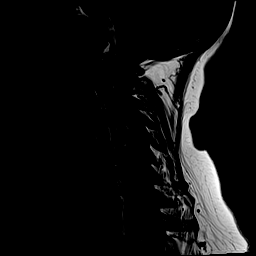
[im 12/15]
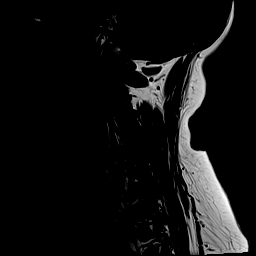
[im 15/15]
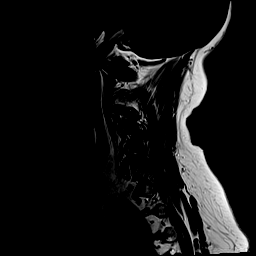

[Series 7: STIR · sagittal · 3.0mm · 0.69mm/px · 6 of 15 slices shown]
[im 1/15]
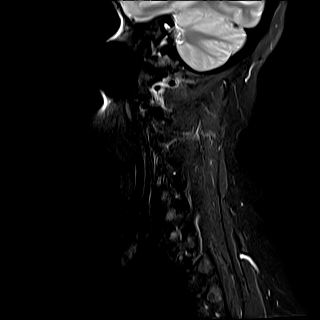
[im 3/15]
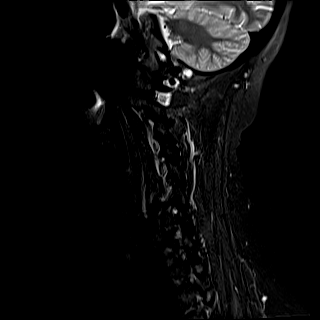
[im 6/15]
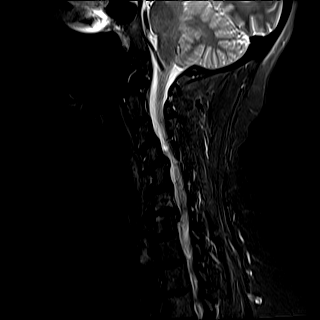
[im 9/15]
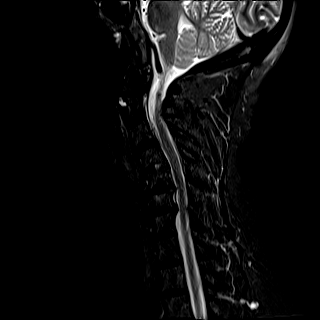
[im 12/15]
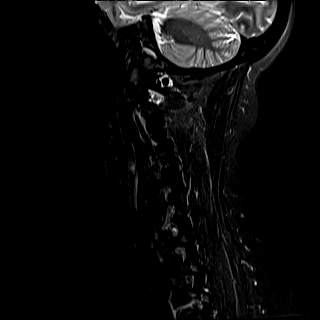
[im 15/15]
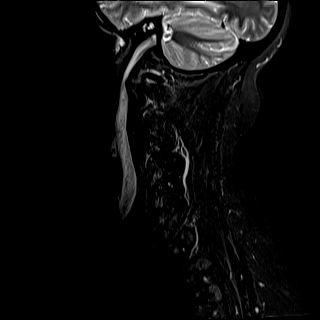

[Series 8: T2 · axial · 3.0mm · 0.70mm/px · z∈[-140,-20]mm · 13 of 40 slices shown (2 of 2)]
[im 1/40]
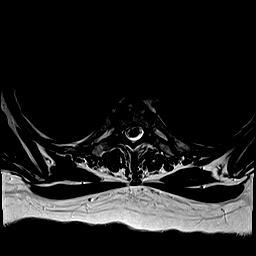
[im 3/40]
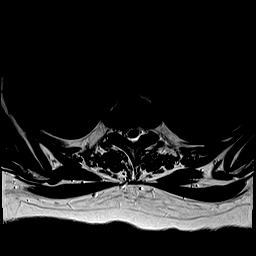
[im 6/40]
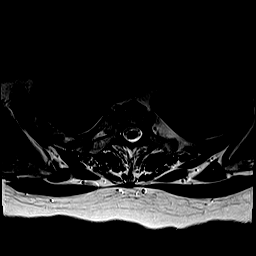
[im 9/40]
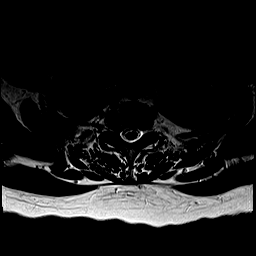
[im 12/40]
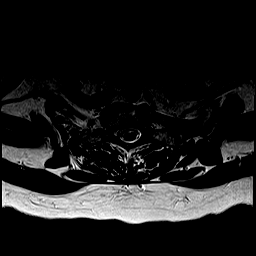
[im 14/40]
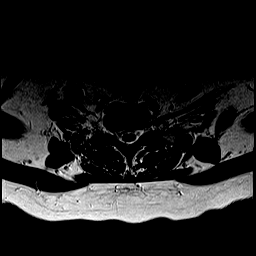
[im 17/40]
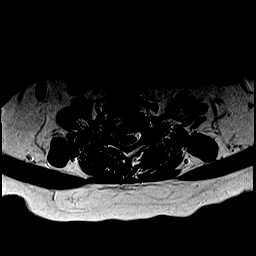
[im 20/40]
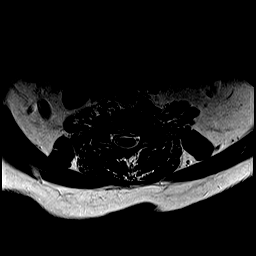
[im 23/40]
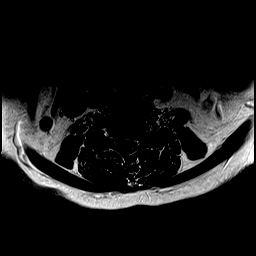
[im 26/40]
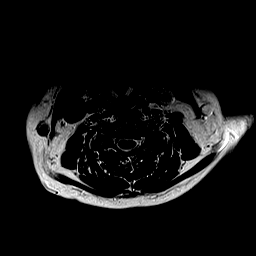
[im 28/40]
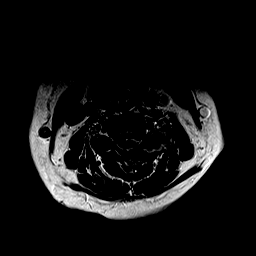
[im 34/40]
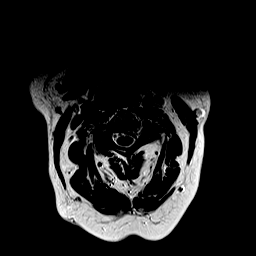
[im 40/40]
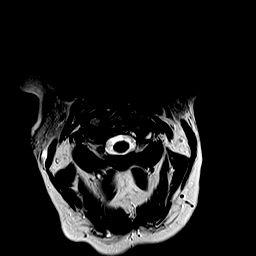

[Series 9: GRE · axial · 3.0mm · 0.35mm/px · z∈[-140,-20]mm · 8 of 40 slices shown]
[im 1/40]
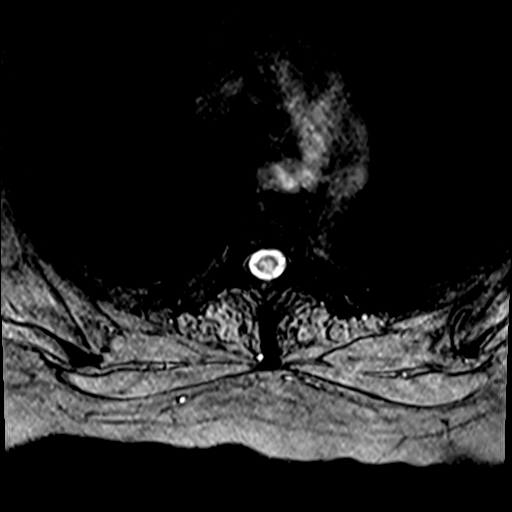
[im 6/40]
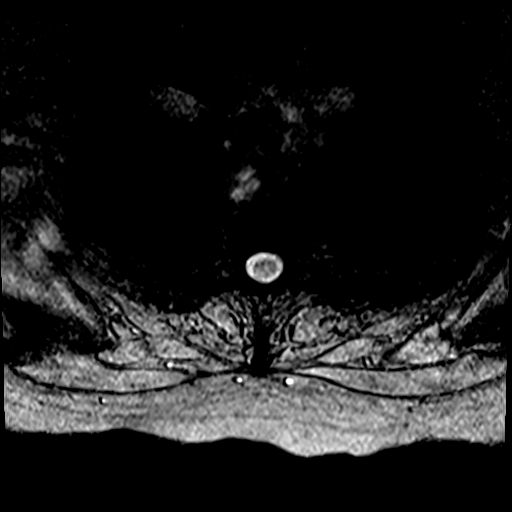
[im 12/40]
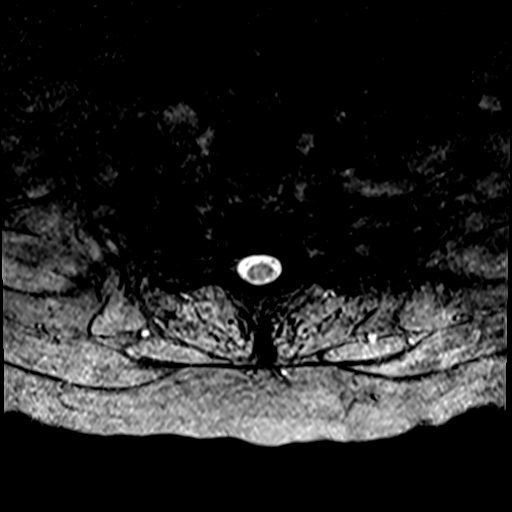
[im 17/40]
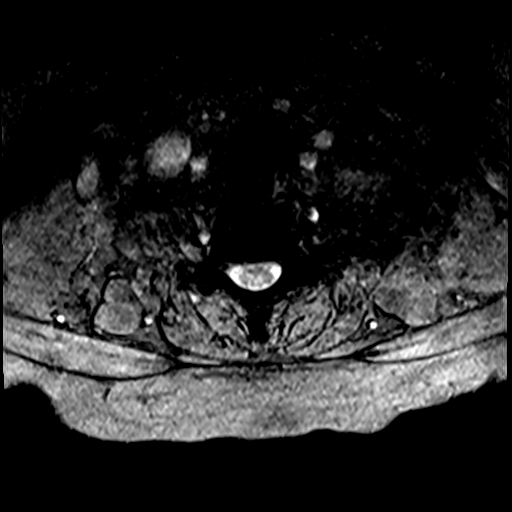
[im 23/40]
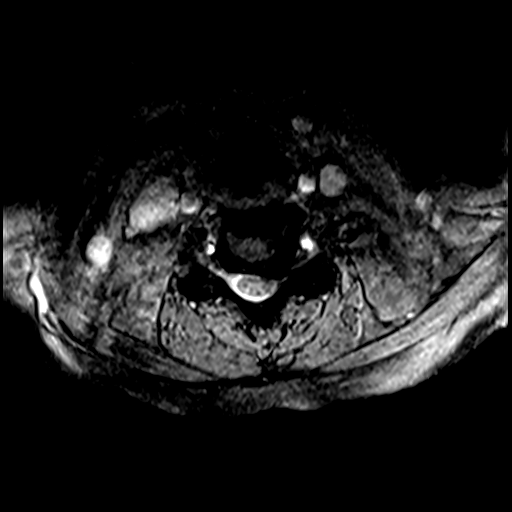
[im 28/40]
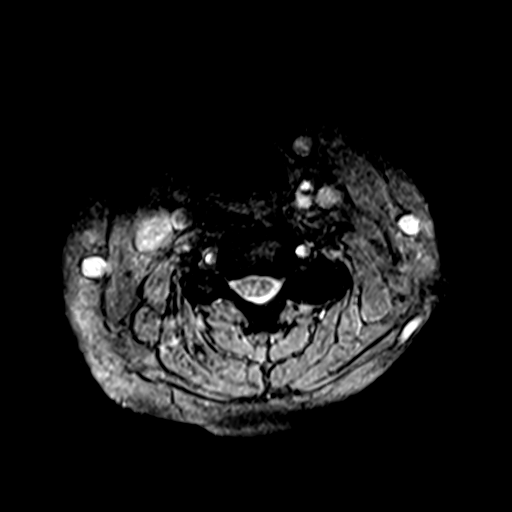
[im 34/40]
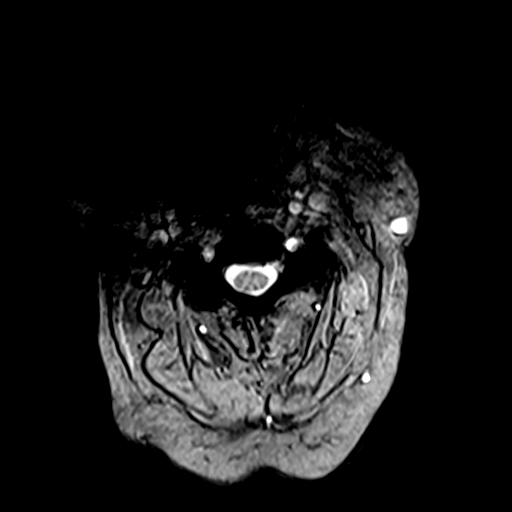
[im 40/40]
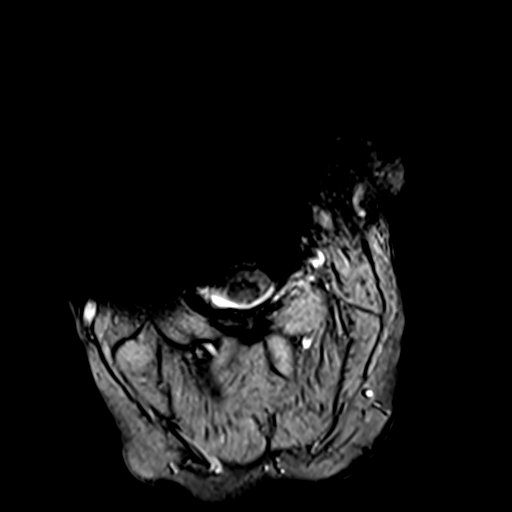

[39 of 48 positions shown; findings below may reference images not displayed]

FINDINGS: Alignment: Reversal of the cervical curvature. Trace anterolisthesis
of C3 over C4.

Vertebrae: No fracture, evidence of discitis, or bone lesion.
Endplate degenerative changes with anterior osteophytes from C4-5
through C6-7.

Cord: Normal signal and morphology.

Posterior Fossa, vertebral arteries, paraspinal tissues: Negative.

Disc levels:

C2-3: Uncovertebral and facet degenerative changes resulting in
moderate right neural foraminal narrowing. No spinal canal stenosis.

C3-4: Uncovertebral and facet degenerative changes resulting in
moderate bilateral neural foraminal narrowing. No spinal canal
stenosis.

C4-5: From posterior disc osteophyte complex without significant
spinal canal stenosis. Uncovertebral and facet degenerative changes
resulting in moderate right and severe left neural foraminal
narrowing.

C5-6: Posterior disc osteophyte complex without significant spinal
canal stenosis. Uncovertebral and facet degenerative changes
resulting in moderate right and severe left neural foraminal
narrowing.

C6-7: Posterior disc osteophyte complex resulting in mild spinal
canal stenosis. Uncovertebral and facet degenerative changes
resulting in severe right and moderate left neural foraminal
narrowing.

C7-T1: Uncovertebral and facet degenerative changes resulting in
mild bilateral neural foraminal narrowing. No spinal canal stenosis.
IMPRESSION: Degenerative changes of the cervical spine, more pronounced at the
level of the facet and uncovertebral joints resulting in multilevel
high-grade neural foraminal narrowing, as described above. No
high-grade spinal canal stenosis.

## 2021-03-09 ENCOUNTER — Encounter (HOSPITAL_COMMUNITY): Payer: Self-pay | Admitting: *Deleted

## 2021-03-09 ENCOUNTER — Observation Stay (HOSPITAL_COMMUNITY)
Admission: EM | Admit: 2021-03-09 | Discharge: 2021-03-10 | Disposition: A | Discharge status: Home / Self Care | Payer: Medicare Other | Attending: Internal Medicine | Admitting: Internal Medicine

## 2021-03-09 ENCOUNTER — Emergency Department (HOSPITAL_COMMUNITY): Payer: Medicare Other

## 2021-03-09 DIAGNOSIS — I10 Essential (primary) hypertension: Secondary | ICD-10-CM | POA: Diagnosis present

## 2021-03-09 DIAGNOSIS — M7918 Myalgia, other site: Secondary | ICD-10-CM

## 2021-03-09 DIAGNOSIS — J45909 Unspecified asthma, uncomplicated: Secondary | ICD-10-CM | POA: Diagnosis not present

## 2021-03-09 DIAGNOSIS — I110 Hypertensive heart disease with heart failure: Secondary | ICD-10-CM | POA: Insufficient documentation

## 2021-03-09 DIAGNOSIS — F17210 Nicotine dependence, cigarettes, uncomplicated: Secondary | ICD-10-CM | POA: Insufficient documentation

## 2021-03-09 DIAGNOSIS — I429 Cardiomyopathy, unspecified: Secondary | ICD-10-CM

## 2021-03-09 DIAGNOSIS — W010XXA Fall on same level from slipping, tripping and stumbling without subsequent striking against object, initial encounter: Secondary | ICD-10-CM | POA: Diagnosis not present

## 2021-03-09 DIAGNOSIS — I509 Heart failure, unspecified: Secondary | ICD-10-CM | POA: Diagnosis not present

## 2021-03-09 DIAGNOSIS — I2510 Atherosclerotic heart disease of native coronary artery without angina pectoris: Secondary | ICD-10-CM | POA: Diagnosis not present

## 2021-03-09 DIAGNOSIS — Z9861 Coronary angioplasty status: Secondary | ICD-10-CM

## 2021-03-09 DIAGNOSIS — Z7902 Long term (current) use of antithrombotics/antiplatelets: Secondary | ICD-10-CM | POA: Diagnosis not present

## 2021-03-09 DIAGNOSIS — Z79899 Other long term (current) drug therapy: Secondary | ICD-10-CM | POA: Diagnosis not present

## 2021-03-09 DIAGNOSIS — S0083XA Contusion of other part of head, initial encounter: Principal | ICD-10-CM

## 2021-03-09 DIAGNOSIS — E876 Hypokalemia: Secondary | ICD-10-CM | POA: Diagnosis present

## 2021-03-09 DIAGNOSIS — R531 Weakness: Secondary | ICD-10-CM

## 2021-03-09 DIAGNOSIS — E785 Hyperlipidemia, unspecified: Secondary | ICD-10-CM | POA: Diagnosis not present

## 2021-03-09 DIAGNOSIS — G8929 Other chronic pain: Secondary | ICD-10-CM

## 2021-03-09 DIAGNOSIS — S0990XA Unspecified injury of head, initial encounter: Secondary | ICD-10-CM | POA: Diagnosis present

## 2021-03-09 DIAGNOSIS — Z20822 Contact with and (suspected) exposure to covid-19: Secondary | ICD-10-CM | POA: Diagnosis not present

## 2021-03-09 LAB — CBC
HCT: 38 % (ref 36.0–46.0)
Hemoglobin: 13.1 g/dL (ref 12.0–15.0)
MCH: 29.8 pg (ref 26.0–34.0)
MCHC: 34.5 g/dL (ref 30.0–36.0)
MCV: 86.6 fL (ref 80.0–100.0)
Platelets: 333 10*3/uL (ref 150–400)
RBC: 4.39 MIL/uL (ref 3.87–5.11)
RDW: 13.8 % (ref 11.5–15.5)
WBC: 9.5 10*3/uL (ref 4.0–10.5)
nRBC: 0 % (ref 0.0–0.2)

## 2021-03-09 LAB — BASIC METABOLIC PANEL
Anion gap: 10 (ref 5–15)
BUN: 17 mg/dL (ref 8–23)
CO2: 35 mmol/L — ABNORMAL HIGH (ref 22–32)
Calcium: 9.1 mg/dL (ref 8.9–10.3)
Chloride: 89 mmol/L — ABNORMAL LOW (ref 98–111)
Creatinine, Ser: 1 mg/dL (ref 0.44–1.00)
GFR, Estimated: >60 mL/min (ref >60)
Glucose, Bld: 119 mg/dL — ABNORMAL HIGH (ref 70–99)
Potassium: 2.1 mmol/L — CL (ref 3.5–5.1)
Sodium: 134 mmol/L — ABNORMAL LOW (ref 135–145)

## 2021-03-09 LAB — MAGNESIUM: Magnesium: 1.8 mg/dL (ref 1.7–2.4)
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
## 2021-03-09 IMAGING — CT CT CERVICAL SPINE W/O CM
3 of 4 series · 12 of 33 positions shown, 14 images · non-contrast
Comparison: None.

CLINICAL DATA: Status post fall.

EXAM:
CT CERVICAL SPINE WITHOUT CONTRAST
TECHNIQUE: Multidetector CT imaging of the cervical spine was performed without
intravenous contrast. Multiplanar CT image reconstructions were also
generated.

[Series 5: sagittal bone · sagittal · 0.23mm/px · 5 of 61 slices shown, 6 images]
[im 21/61  bone]
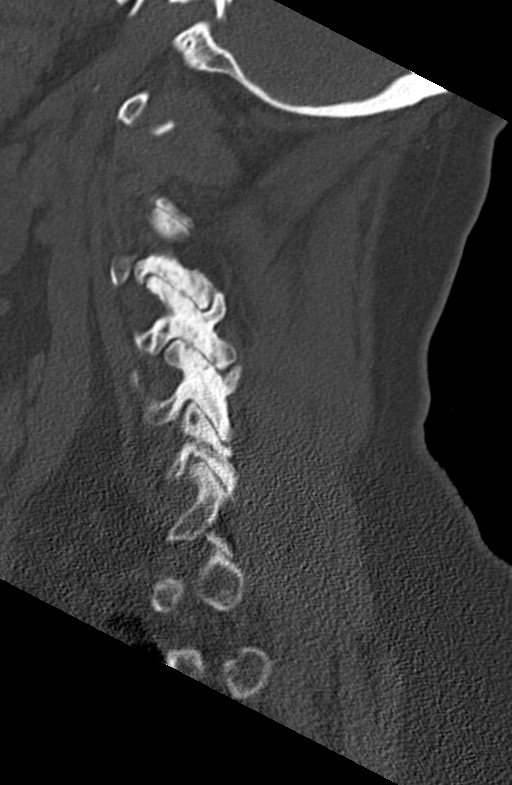
[im 26/61  bone]
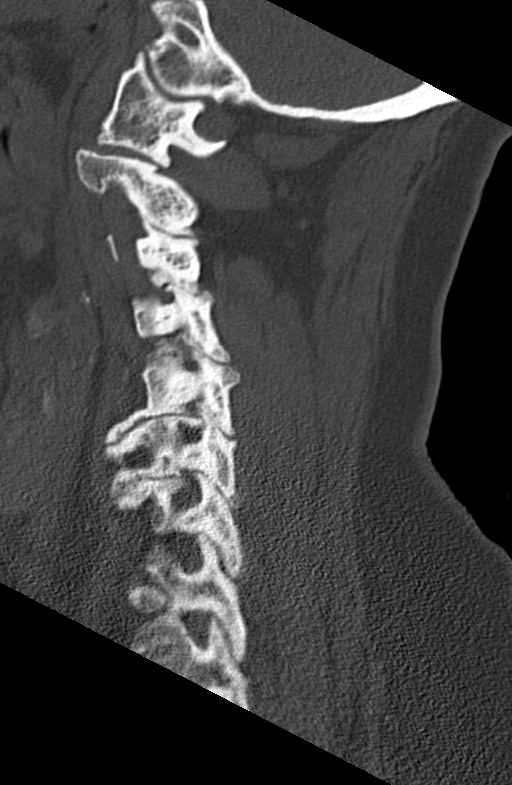
[im 31/61  soft-tissue]
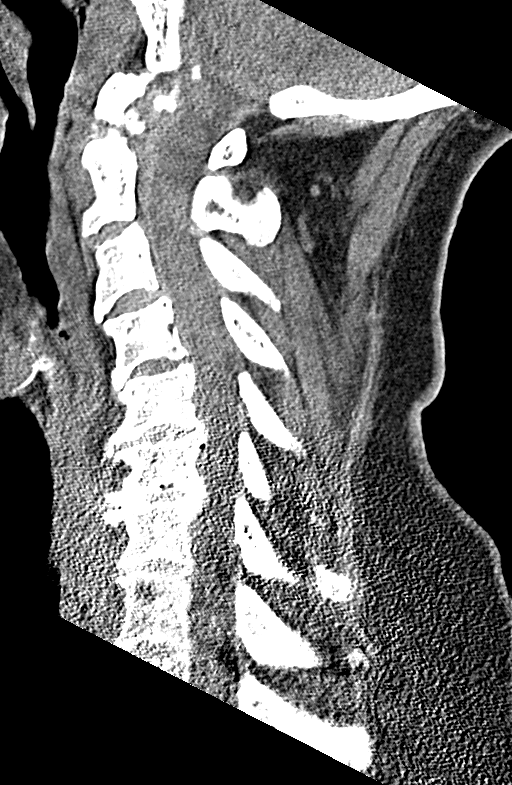
[im 31/61  bone]
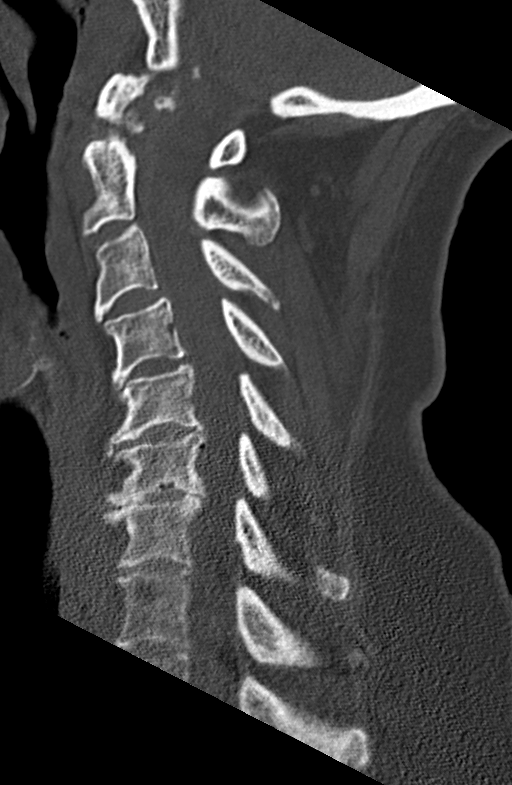
[im 36/61  bone]
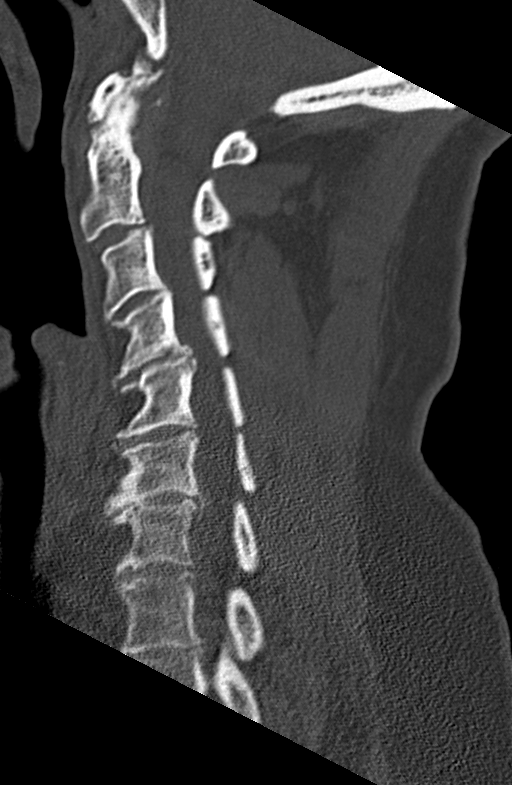
[im 41/61  bone]
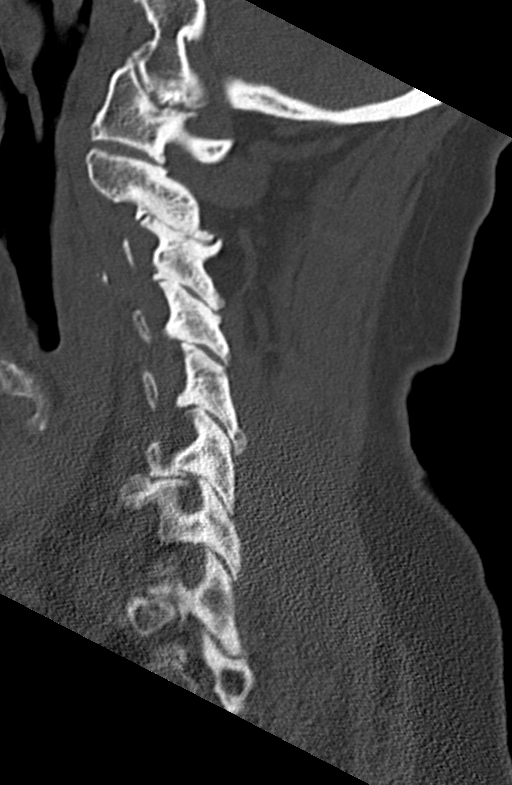

[Series 6: coronal bone · coronal · 0.23mm/px · 3 of 61 slices shown]
[im 16/61  bone]
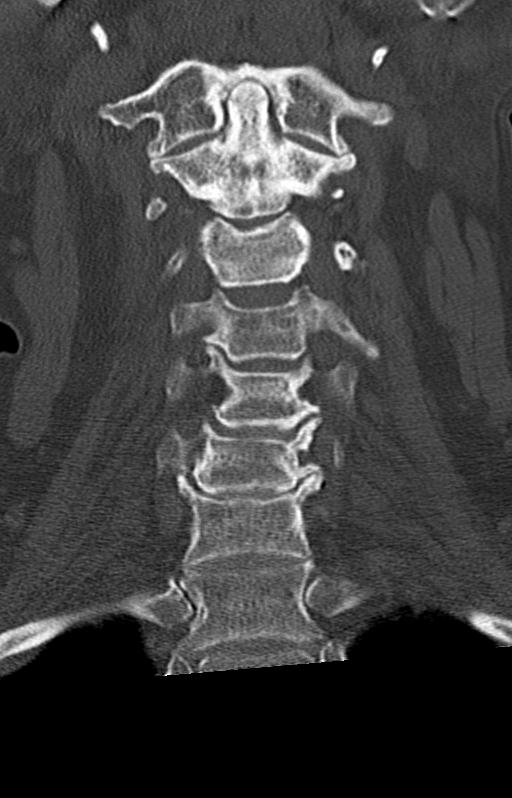
[im 26/61  bone]
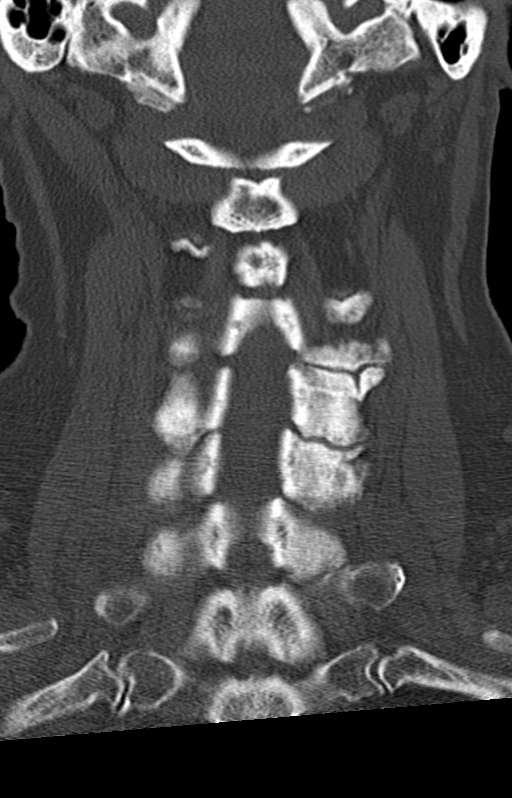
[im 35/61  bone]
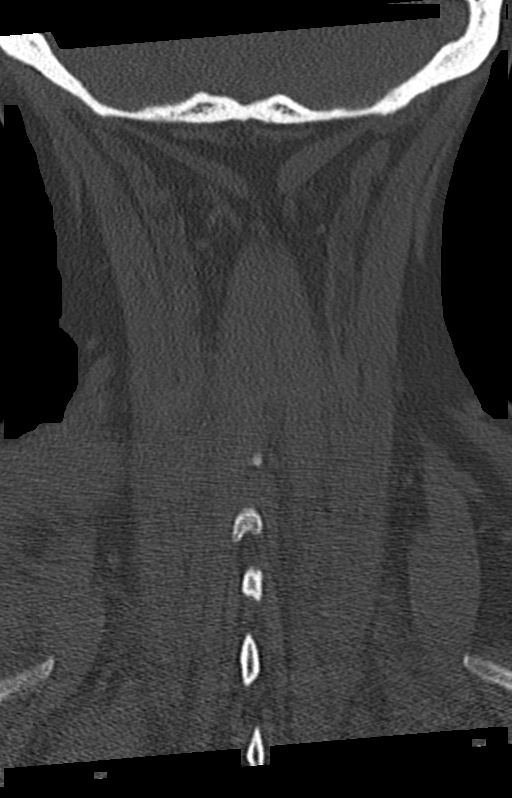

[Series 7: orthogonal axials · axial · 0.21mm/px · z∈[-19,+54]mm · 4 of 82 slices shown, 5 images]
[im 14/82  soft-tissue]
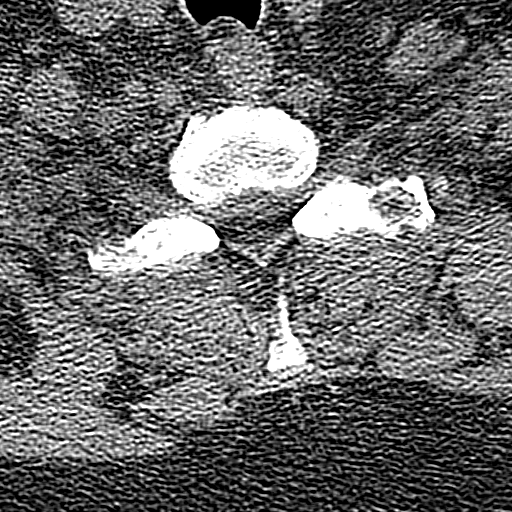
[im 14/82  bone]
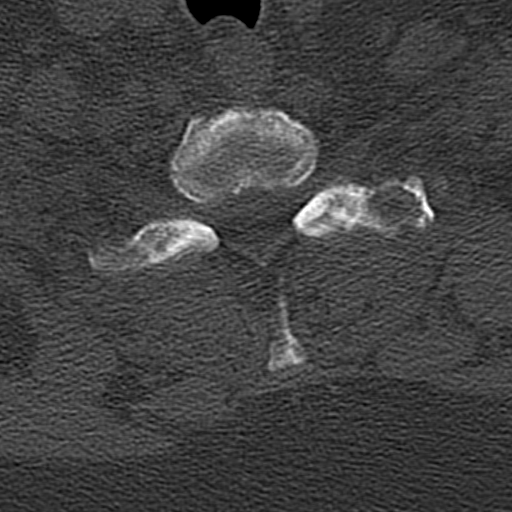
[im 28/82  bone]
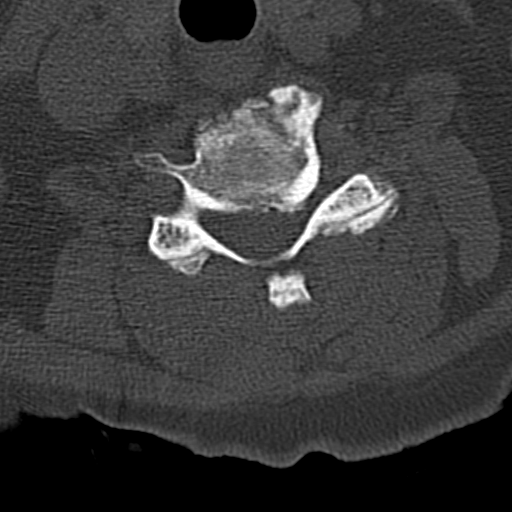
[im 55/82  bone]
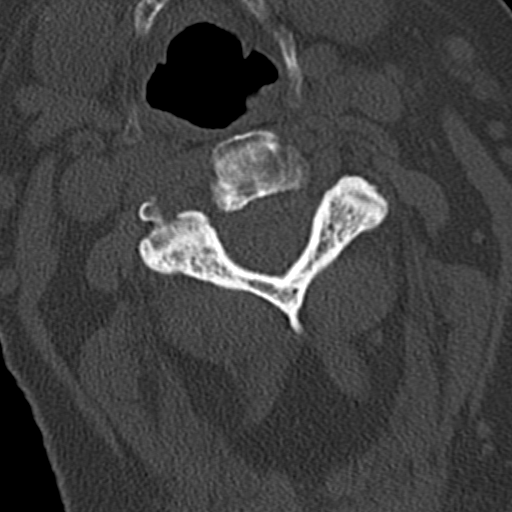
[im 68/82  bone]
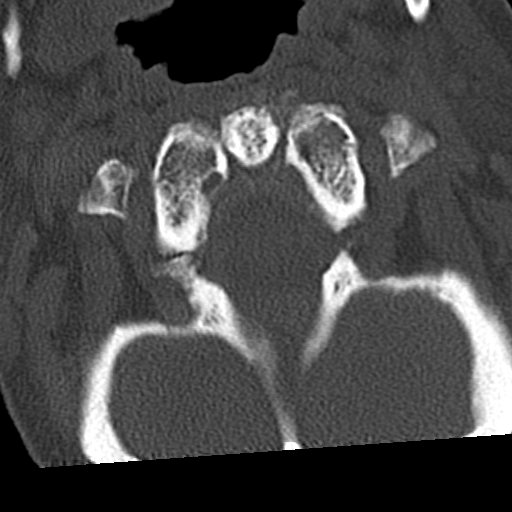

[12 of 33 positions shown; findings below may reference images not displayed]

FINDINGS: Alignment: Normal.

Skull base and vertebrae: No acute fracture. No primary bone lesion
or focal pathologic process.

Soft tissues and spinal canal: No prevertebral fluid or swelling. No
visible canal hematoma.

Disc levels: Moderate severity endplate sclerosis and moderate to
marked severity anterior osteophyte formation is seen at the levels
of C5-C6 and C6-C7. Mild anterior osteophyte formation is also noted
at the levels of C3-C4 and C4-C5.

There is moderate severity narrowing of the anterior atlantoaxial
articulation. Moderate to marked severity intervertebral disc space
narrowing is seen at C6-C7. Moderate severity intervertebral disc
space narrowing is noted at C4-C5 and C5-C6.

Bilateral marked severity multilevel facet joint hypertrophy is
noted.

Upper chest: Negative.

Other: None.
IMPRESSION: 1. Moderate severity multilevel degenerative changes, most prominent
at the levels of C5-C6 and C6-C7.
2. No evidence of an acute fracture or subluxation.

## 2021-03-09 MED ORDER — LEVOTHYROXINE SODIUM 50 MCG PO TABS: 75.0000 ug | ORAL_TABLET | Freq: Every day | ORAL | Status: DC

## 2021-03-09 MED ORDER — OXYCODONE-ACETAMINOPHEN 5-325 MG PO TABS: 1.0000 | ORAL_TABLET | ORAL | Status: DC | PRN

## 2021-03-09 MED ORDER — OXYCODONE HCL 5 MG PO TABS
5.0000 mg | ORAL_TABLET | ORAL | Status: DC | PRN
Start: 2021-03-09 — End: 2021-03-09
  Filled 2021-03-09: qty 1

## 2021-03-09 MED ORDER — CLOPIDOGREL BISULFATE 75 MG PO TABS
75.0000 mg | ORAL_TABLET | Freq: Every day | ORAL | Status: DC
  Administered 2021-03-10: 75 mg via ORAL
  Filled 2021-03-09: qty 1

## 2021-03-09 MED ORDER — MOMETASONE FURO-FORMOTEROL FUM 200-5 MCG/ACT IN AERO
2.0000 | INHALATION_SPRAY | Freq: Two times a day (BID) | RESPIRATORY_TRACT | Status: DC
  Administered 2021-03-09 – 2021-03-10 (×2): 2 via RESPIRATORY_TRACT
  Filled 2021-03-09: qty 8.8

## 2021-03-09 MED ORDER — POTASSIUM CHLORIDE CRYS ER 20 MEQ PO TBCR
40.0000 meq | EXTENDED_RELEASE_TABLET | ORAL | Status: AC
Start: 2021-03-09 — End: 2021-03-10
  Administered 2021-03-09 – 2021-03-10 (×2): 40 meq via ORAL
  Filled 2021-03-09 (×2): qty 2

## 2021-03-09 MED ORDER — ATORVASTATIN CALCIUM 40 MG PO TABS
80.0000 mg | ORAL_TABLET | Freq: Every day | ORAL | Status: DC
  Administered 2021-03-10: 80 mg via ORAL
  Filled 2021-03-09: qty 2

## 2021-03-09 MED ORDER — MAGNESIUM SULFATE 2 GM/50ML IV SOLN
2.0000 g | Freq: Once | INTRAVENOUS | Status: AC
  Administered 2021-03-09: 2 g via INTRAVENOUS
  Filled 2021-03-09: qty 50

## 2021-03-09 MED ORDER — POTASSIUM CHLORIDE CRYS ER 20 MEQ PO TBCR
40.0000 meq | EXTENDED_RELEASE_TABLET | Freq: Once | ORAL | Status: AC
  Administered 2021-03-09: 40 meq via ORAL
  Filled 2021-03-09: qty 2

## 2021-03-09 MED ORDER — OXYCODONE HCL ER 15 MG PO T12A
15.0000 mg | EXTENDED_RELEASE_TABLET | Freq: Two times a day (BID) | ORAL | Status: DC
Start: 2021-03-09 — End: 2021-03-10
  Administered 2021-03-09 – 2021-03-10 (×2): 15 mg via ORAL
  Filled 2021-03-09 (×2): qty 1

## 2021-03-09 MED ORDER — OXYCODONE HCL 5 MG PO TABS
10.0000 mg | ORAL_TABLET | Freq: Three times a day (TID) | ORAL | Status: DC | PRN
  Administered 2021-03-09 – 2021-03-10 (×2): 10 mg via ORAL
  Filled 2021-03-09 (×2): qty 2

## 2021-03-09 MED ORDER — LEVOTHYROXINE SODIUM 75 MCG PO TABS
75.0000 ug | ORAL_TABLET | Freq: Every day | ORAL | Status: DC
  Administered 2021-03-10: 75 ug via ORAL
  Filled 2021-03-09: qty 2

## 2021-03-09 MED ORDER — OXYCODONE-ACETAMINOPHEN 10-325 MG PO TABS: 1.0000 | ORAL_TABLET | ORAL | Status: DC | PRN

## 2021-03-09 MED ORDER — CARVEDILOL 12.5 MG PO TABS
12.5000 mg | ORAL_TABLET | Freq: Two times a day (BID) | ORAL | Status: DC
  Administered 2021-03-09 – 2021-03-10 (×2): 12.5 mg via ORAL
  Filled 2021-03-09 (×2): qty 1

## 2021-03-09 MED ORDER — ENOXAPARIN SODIUM 40 MG/0.4ML IJ SOSY: 40.0000 mg | PREFILLED_SYRINGE | INTRAMUSCULAR | Status: DC

## 2021-03-09 MED ORDER — PANTOPRAZOLE SODIUM 40 MG PO TBEC
40.0000 mg | DELAYED_RELEASE_TABLET | Freq: Every day | ORAL | Status: DC
  Administered 2021-03-10: 40 mg via ORAL
  Filled 2021-03-09: qty 1

## 2021-03-09 MED ORDER — LOSARTAN POTASSIUM 50 MG PO TABS
100.0000 mg | ORAL_TABLET | Freq: Every day | ORAL | Status: DC
  Administered 2021-03-10: 100 mg via ORAL
  Filled 2021-03-09: qty 2

## 2021-03-09 MED ORDER — LOSARTAN POTASSIUM 25 MG PO TABS: 100.0000 mg | ORAL_TABLET | Freq: Every day | ORAL | Status: DC

## 2021-03-09 MED ORDER — POTASSIUM CHLORIDE 10 MEQ/100ML IV SOLN
10.0000 meq | INTRAVENOUS | Status: AC
  Administered 2021-03-09 – 2021-03-10 (×4): 10 meq via INTRAVENOUS
  Filled 2021-03-09 (×4): qty 100

## 2021-03-09 MED ORDER — ATORVASTATIN CALCIUM 40 MG PO TABS: 80.0000 mg | ORAL_TABLET | Freq: Every day | ORAL | Status: DC
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
## 2021-03-09 NOTE — ED Triage Notes (Signed)
Patient is on Plavix
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
## 2021-03-09 NOTE — ED Triage Notes (Signed)
Fell 1 week ago,hit head on cabinet, both feet swollen noticed grip strength has weakened. Continues to drop things for the last several days

## 2021-03-10 ENCOUNTER — Other Ambulatory Visit: Payer: Self-pay

## 2021-03-10 DIAGNOSIS — S0083XA Contusion of other part of head, initial encounter: Secondary | ICD-10-CM | POA: Diagnosis not present

## 2021-03-10 DIAGNOSIS — E785 Hyperlipidemia, unspecified: Secondary | ICD-10-CM | POA: Diagnosis not present

## 2021-03-10 DIAGNOSIS — Z7902 Long term (current) use of antithrombotics/antiplatelets: Secondary | ICD-10-CM

## 2021-03-10 DIAGNOSIS — M7918 Myalgia, other site: Secondary | ICD-10-CM | POA: Diagnosis not present

## 2021-03-10 DIAGNOSIS — I10 Essential (primary) hypertension: Secondary | ICD-10-CM | POA: Diagnosis not present

## 2021-03-10 LAB — HIV ANTIBODY (ROUTINE TESTING W REFLEX): HIV Screen 4th Generation wRfx: NONREACTIVE

## 2021-03-10 LAB — CBC
HCT: 34.2 % — ABNORMAL LOW (ref 36.0–46.0)
Hemoglobin: 12 g/dL (ref 12.0–15.0)
MCH: 30.5 pg (ref 26.0–34.0)
MCHC: 35.1 g/dL (ref 30.0–36.0)
MCV: 86.8 fL (ref 80.0–100.0)
Platelets: 312 10*3/uL (ref 150–400)
RBC: 3.94 MIL/uL (ref 3.87–5.11)
RDW: 13.9 % (ref 11.5–15.5)
WBC: 9.9 10*3/uL (ref 4.0–10.5)
nRBC: 0 % (ref 0.0–0.2)

## 2021-03-10 LAB — RESP PANEL BY RT-PCR (FLU A&B, COVID) ARPGX2
Influenza A by PCR: NEGATIVE
Influenza B by PCR: NEGATIVE
SARS Coronavirus 2 by RT PCR: NEGATIVE

## 2021-03-10 LAB — BASIC METABOLIC PANEL
Anion gap: 7 (ref 5–15)
BUN: 11 mg/dL (ref 8–23)
CO2: 31 mmol/L (ref 22–32)
Calcium: 8.6 mg/dL — ABNORMAL LOW (ref 8.9–10.3)
Chloride: 100 mmol/L (ref 98–111)
Creatinine, Ser: 0.72 mg/dL (ref 0.44–1.00)
GFR, Estimated: >60 mL/min (ref >60)
Glucose, Bld: 97 mg/dL (ref 70–99)
Potassium: 3.8 mmol/L (ref 3.5–5.1)
Sodium: 138 mmol/L (ref 135–145)

## 2021-03-10 LAB — MAGNESIUM: Magnesium: 1.9 mg/dL (ref 1.7–2.4)

## 2021-03-10 MED ORDER — POTASSIUM CHLORIDE CRYS ER 20 MEQ PO TBCR: 20.0000 meq | EXTENDED_RELEASE_TABLET | Freq: Every day | ORAL | 1 refills | Status: DC

## 2021-03-10 MED ORDER — OXYCODONE HCL 5 MG PO TABS: 10.0000 mg | ORAL_TABLET | ORAL | Status: DC | PRN

## 2021-03-10 MED ORDER — NICOTINE 21 MG/24HR TD PT24
21.0000 mg | MEDICATED_PATCH | Freq: Every day | TRANSDERMAL | Status: DC
  Administered 2021-03-10: 21 mg via TRANSDERMAL
  Filled 2021-03-10: qty 1

## 2021-03-10 MED ORDER — INFLUENZA VAC SPLIT QUAD 0.5 ML IM SUSY: 0.5000 mL | PREFILLED_SYRINGE | INTRAMUSCULAR | Status: DC

## 2021-03-10 MED ORDER — METHOCARBAMOL 500 MG PO TABS
500.0000 mg | ORAL_TABLET | Freq: Four times a day (QID) | ORAL | Status: DC
  Administered 2021-03-10: 500 mg via ORAL
  Filled 2021-03-10: qty 1

## 2021-03-10 MED ORDER — CLOPIDOGREL BISULFATE 75 MG PO TABS: 75.0000 mg | ORAL_TABLET | Freq: Every day | ORAL | Status: DC

## 2021-03-10 NOTE — Plan of Care (Signed)

## 2021-03-10 NOTE — Evaluation (Addendum)
Physical Therapy Evaluation Patient Details Name: Jessica Bell MRN: [id number] DOB: 1960/09/20 Today's Date: 03/10/2021  History of Present Illness  Jessica Bell  is a 60 y.o. female, past medical history of CAD on Plavix, hypertension, hyperlipidemia, [place] with chronic pain syndrome status post multiple surgeries, patient presents to ED secondary to fall, reports she sustained a fall 1 week ago, reports her walker wheel at the edge of the furniture, where she fell, she denies any dizziness or lightheadedness preceding that call, reports she hit her forehead, significant bruising, she is on Plavix, but recently she has been feeling more weak, fatigued, dropping things, she is with significant lower extremity edema, started on Lasix by her cardiologist couple months ago, and had worsening edema where she was put on torsemide instead of Lasix few days ago, she took for around 4 days, but she started given increased urinary frequency, which she is back on her Lasix, she denies any dyspnea, orthopnea, she does report recently dizziness and lightheadedness.  In ED blood pressure was appropriate, orthostatics is pending, work-up significant for kalemia potassium 2.1, hyponatremia sodium of 134, magnesium of 1.8, EKGs with PVC, bicarb elevated at 35, [facility] hospitalist consulted to admit.   Clinical Impression  Patient in bed awake, alert, and agreeable for therapy. Patient transitioned from supine w/ HOB flat to sitting EOB w/ increased time. Patient demonstrated steadiness w/ ambulating a short distance w/out AD and ambulation in hallway using RW with no loss of balance. Patient reports using RW occasionally for household and community ambulation d/t back pain. Patient discharged to care of nursing for ambulation daily as tolerated for length of stay.       Recommendations for follow up therapy are one component of a multi-disciplinary discharge planning process, led by the attending physician.  Recommendations  may be updated based on patient status, additional functional criteria and insurance authorization.  Follow Up Recommendations No PT follow up    Assistance Recommended at Discharge None  Functional Status Assessment    Equipment Recommendations  None recommended by PT    Recommendations for Other Services       Precautions / Restrictions Precautions Precautions: None Restrictions Weight Bearing Restrictions: No      Mobility  Bed Mobility Overal bed mobility: Modified Independent             General bed mobility comments: HOB flat    Transfers Overall transfer level: Modified independent Equipment used: Rolling walker (2 wheels)               General transfer comment: increased time    Ambulation/Gait Ambulation/Gait assistance: Modified independent (Device/Increase time) Gait Distance (Feet): 100 Feet Assistive device: Rolling walker (2 wheels) Gait Pattern/deviations: WFL(Within Functional Limits) Gait velocity: at baseline   General Gait Details: at baseline. Patient ambulated 4 feet without using the RW, demonstrated steadiness, no loss of balance, forward bent posture when using RW  Stairs            Wheelchair Mobility    Modified Rankin (Stroke Patients Only)       Balance Overall balance assessment: Modified Independent Sitting-balance support: No upper extremity supported;Feet supported Sitting balance-Leahy Scale: Good Sitting balance - Comments: seated EOB   Standing balance support: During functional activity;No upper extremity supported Standing balance-Leahy Scale: Fair Standing balance comment: fair without using RW. Good using RW  Pertinent Vitals/Pain Pain Assessment: 0-10 Pain Score: 7  Pain Location: L hip and low back Pain Descriptors / Indicators: Sharp Pain Intervention(s): Limited activity within patient's tolerance;Monitored during session;Repositioned    Home Living  Family/patient expects to be discharged to:: Private residence Living Arrangements: Spouse/significant other Available Help at Discharge: Family;Available 24 hours/day Type of Home: House Home Access: Stairs to enter Entrance Stairs-Rails: Right Entrance Stairs-Number of Steps: 3   Home Layout: One level Home Equipment: Agricultural consultant (2 wheels);Standard Walker;Cane - single point;BSC;Shower seat      Prior Function Prior Level of Function : Independent/Modified Independent             Mobility Comments: Household and community ambulator with RW PRN. Pt uses cart or scooter in grocery store depending on pain level. ADLs Comments: Independent     Hand Dominance   Dominant Hand: Right    Extremity/Trunk Assessment   Upper Extremity Assessment Upper Extremity Assessment: Generalized weakness (Pt reports baseline level strength.)    Lower Extremity Assessment Lower Extremity Assessment: Overall WFL for tasks assessed    Cervical / Trunk Assessment Cervical / Trunk Assessment: Normal  Communication   Communication: No difficulties  Cognition Arousal/Alertness: Awake/alert Behavior During Therapy: WFL for tasks assessed/performed Overall Cognitive Status: Within Functional Limits for tasks assessed                                          General Comments      Exercises     Assessment/Plan    PT Assessment Patient does not need any further PT services  PT Problem List         PT Treatment Interventions      PT Goals (Current goals can be found in the Care Plan section)  Acute Rehab PT Goals Patient Stated Goal: Return home PT Goal Formulation: With patient Time For Goal Achievement: 03/10/21 Potential to Achieve Goals: Good    Frequency     Barriers to discharge        Co-evaluation               AM-PAC PT "6 Clicks" Mobility  Outcome Measure Help needed turning from your back to your side while in a flat bed without  using bedrails?: None Help needed moving from lying on your back to sitting on the side of a flat bed without using bedrails?: None Help needed moving to and from a bed to a chair (including a wheelchair)?: None Help needed standing up from a chair using your arms (e.g., wheelchair or bedside chair)?: None Help needed to walk in hospital room?: None Help needed climbing 3-5 steps with a railing? : None 6 Click Score: 24    End of Session   Activity Tolerance: Patient tolerated treatment well Patient left: in bed;with call bell/phone within reach Nurse Communication: Mobility status PT Visit Diagnosis: Unsteadiness on feet (R26.81);Other abnormalities of gait and mobility (R26.89);Muscle weakness (generalized) (M62.81)    Time: 9211-9417 PT Time Calculation (min) (ACUTE ONLY): 12 min   Charges:   PT Evaluation $PT Eval Low Complexity: 1 Low PT Treatments $Therapeutic Activity: 8-22 mins        Cassie Jones, SPT  During this treatment session, the therapist was present, participating in and directing the treatment.  12:17 PM, 03/10/21 Ocie Bob, MPT Physical Therapist with [facility] [phone] office [phone] mobile phone

## 2021-03-10 NOTE — Evaluation (Signed)
Occupational Therapy Evaluation Patient Details Name: Jessica Bell MRN: [id number] DOB: 1961-04-03 Today's Date: 03/10/2021   History of Present Illness Jessica Bell  is a 60 y.o. female, past medical history of CAD on Plavix, hypertension, hyperlipidemia, [place] with chronic pain syndrome status post multiple surgeries, patient presents to ED secondary to fall, reports she sustained a fall 1 week ago, reports her walker wheel at the edge of the furniture, where she fell, she denies any dizziness or lightheadedness preceding that call, reports she hit her forehead, significant bruising, she is on Plavix, but recently she has been feeling more weak, fatigued, dropping things, she is with significant lower extremity edema, started on Lasix by her cardiologist couple months ago, and had worsening edema where she was put on torsemide instead of Lasix few days ago, she took for around 4 days, but she started given increased urinary frequency, which she is back on her Lasix, she denies any dyspnea, orthopnea, she does report recently dizziness and lightheadedness.  In ED blood pressure was appropriate, orthostatics is pending, work-up significant for kalemia potassium 2.1, hyponatremia sodium of 134, magnesium of 1.8, EKGs with PVC, bicarb elevated at 35, [facility] hospitalist consulted to admit.   Clinical Impression   Pt agreeable to OT evaluation. Pt able to complete bed mobility without difficulty with HOB slightly elevated. Pt able to don slide on shoes and complete ambulatory transfer to toilet then to chair using RW with fair to good balance using RW. Pt demonstrates generalized B UE weakness but this is reported to be at baseline levels. Pt is not recommended for further acute OT services and will be discharged to care of nursing staff for remaining length of stay.      Recommendations for follow up therapy are one component of a multi-disciplinary discharge planning process, led by the attending physician.   Recommendations may be updated based on patient status, additional functional criteria and insurance authorization.   Follow Up Recommendations  No OT follow up    Assistance Recommended at Discharge PRN  Functional Status Assessment  Patient has not had a recent decline in their functional status  Equipment Recommendations  None recommended by OT           Precautions / Restrictions Precautions Precautions: None Restrictions Weight Bearing Restrictions: No      Mobility Bed Mobility Overal bed mobility: Modified Independent             General bed mobility comments: HOB slightly elevated.    Transfers Overall transfer level: Modified independent Equipment used: Rolling walker (2 wheels)               General transfer comment: Pt able to complete ambulatory transfer to toilet then to chair without physical assist.      Balance Overall balance assessment: Needs assistance Sitting-balance support: No upper extremity supported;Feet supported Sitting balance-Leahy Scale: Good Sitting balance - Comments: seated EOB   Standing balance support: Bilateral upper extremity supported;During functional activity Standing balance-Leahy Scale: Fair Standing balance comment: fair to good with RW                           ADL either performed or assessed with clinical judgement   ADL Overall ADL's : Modified independent  Vision Baseline Vision/History: 1 Wears glasses Ability to See in Adequate Light: 0 Adequate Patient Visual Report: Other (comment) (Pt had blurred vision for a few days after fall, but vision is normal now.) Vision Assessment?: No apparent visual deficits                Pertinent Vitals/Pain Pain Assessment: 0-10 Pain Score: 7  Pain Location: L hip and low back Pain Descriptors / Indicators: Sharp Pain Intervention(s): Limited activity within patient's  tolerance;Monitored during session;Repositioned     Hand Dominance Right   Extremity/Trunk Assessment Upper Extremity Assessment Upper Extremity Assessment: Generalized weakness (Pt reports baseline level strength.)   Lower Extremity Assessment Lower Extremity Assessment: Defer to PT evaluation   Cervical / Trunk Assessment Cervical / Trunk Assessment: Normal   Communication Communication Communication: No difficulties   Cognition Arousal/Alertness: Awake/alert Behavior During Therapy: WFL for tasks assessed/performed Overall Cognitive Status: Within Functional Limits for tasks assessed                                                        Home Living Family/patient expects to be discharged to:: Private residence Living Arrangements: Spouse/significant other Available Help at Discharge: Family;Available 24 hours/day Type of Home: House Home Access: Stairs to enter [employer] of Steps: 3 Entrance Stairs-Rails: Right Home Layout: One level     Bathroom Shower/Tub: Producer, television/film/video: Handicapped height Bathroom Accessibility: Yes How Accessible: Accessible via walker Home Equipment: Rolling Walker (2 wheels);Standard Walker;Cane - single point;BSC;Shower seat          Prior Functioning/Environment Prior Level of Function : Independent/Modified Independent             Mobility Comments: Household and community ambulator with RW PRN. Pt uses cart or scooter in grocery store depending on pain level. ADLs Comments: Independent ADL's and IADL's                      OT Goals(Current goals can be found in the care plan section) Acute Rehab OT Goals Patient Stated Goal: return home   AM-PAC OT "6 Clicks" Daily Activity     Outcome Measure Help from another person eating meals?: None Help from another person taking care of personal grooming?: None Help from another person toileting, which includes using  toliet, bedpan, or urinal?: None Help from another person bathing (including washing, rinsing, drying)?: None Help from another person to put on and taking off regular upper body clothing?: None Help from another person to put on and taking off regular lower body clothing?: None 6 Click Score: 24   End of Session Equipment Utilized During Treatment: Rolling walker (2 wheels) Nurse Communication: Mobility status  Activity Tolerance: Patient tolerated treatment well Patient left: in bed;with call bell/phone within reach;with bed alarm set  OT Visit Diagnosis: Unsteadiness on feet (R26.81);History of falling (Z91.81)                Time: 8588-5027 OT Time Calculation (min): 14 min Charges:  OT General Charges $OT Visit: 1 Visit OT Evaluation $OT Eval Low Complexity: 1 Low  Deshea Pooley OT, MOT  Danie Chandler 03/10/2021, 10:04 AM

## 2021-03-10 NOTE — Discharge Summary (Signed)
Physician Discharge Summary  Jessica Bell EHO:[id number] DOB: [birth year] DOA: 03/09/2021  PCP: Tanna Furry, MD  Admit date: 03/09/2021 Discharge date: 03/10/2021  Admitted From: Home Disposition: Home  Recommendations for Outpatient Follow-up:  Follow up with PCP in 1-2 weeks Please obtain BMP/CBC in one week  Home Health: Equipment/Devices:  Discharge Condition: Stable CODE STATUS: Full code Diet recommendation: Heart healthy  Brief/Interim Summary: 60 year old female admitted to the hospital with generalized weakness.  She was noted to have severe hypokalemia with a potassium of 2.1.  The patient has been taking diuretics.  She was not on any potassium supplementation.  She was taking both Lasix and hydrochlorothiazide.  She was taking diuretics for lower extremity edema.  Review of her home medication showed that she was taking amlodipine which was discontinued on admission.  The patient was aggressively replaced with potassium supplementations.  Follow potassium the next day was noted to be in normal range.  The patient was feeling substantially better after potassium replacement.  She was able to ambulate with physical therapy and appeared to be functioning at baseline.  She will be discharged with potassium supplementation.  She has been advised to follow-up with her primary care physician for repeat basic metabolic panel in 1 week to ensure that electrolytes are stable.  Patient is agreeable for discharge home.  Discharge Diagnoses:  Active Problems:   CAD S/P percutaneous coronary angioplasty   Dyslipidemia   Essential hypertension   Hypokalemia   Myocardiopathy (HCC)   Antiplatelet or antithrombotic long-term use   Chronic musculoskeletal pain    Discharge Instructions  Discharge Instructions     Diet - low sodium heart healthy   Complete by: As directed    Increase activity slowly   Complete by: As directed       Allergies as of 03/10/2021        Reactions   Amoxicillin-pot Clavulanate Anaphylaxis, Other (See Comments)   N/V/D   Seasonal Ic [cholestatin]    Per pt not allergic to medication-has seasonal allergies        Medication List     STOP taking these medications    amLODipine 5 MG tablet Commonly known as: NORVASC   dicyclomine 10 MG capsule Commonly known as: BENTYL   hydrochlorothiazide 25 MG tablet Commonly known as: HYDRODIURIL   losartan-hydrochlorothiazide 100-25 MG tablet Commonly known as: HYZAAR   oxyCODONE-acetaminophen 10-325 MG tablet Commonly known as: PERCOCET   phenazopyridine 200 MG tablet Commonly known as: PYRIDIUM   predniSONE 20 MG tablet Commonly known as: DELTASONE   torsemide 20 MG tablet Commonly known as: DEMADEX       TAKE these medications    albuterol 108 (90 Base) MCG/ACT inhaler Commonly known as: VENTOLIN HFA Inhale into the lungs.   Allegra-D Allergy & Congestion 180-240 MG 24 hr tablet Generic drug: fexofenadine-pseudoephedrine Take 1 tablet by mouth as needed.   atorvastatin 80 MG tablet Commonly known as: LIPITOR Take 80 mg by mouth daily. What changed: Another medication with the same name was removed. Continue taking this medication, and follow the directions you see here.   baclofen 10 MG tablet Commonly known as: LIORESAL Take 10 mg by mouth 3 (three) times daily as needed.   budesonide-formoterol 160-4.5 MCG/ACT inhaler Commonly known as: Symbicort Inhale 1 puff into the lungs in the morning and at bedtime.   carvedilol 12.5 MG tablet Commonly known as: COREG Take 12.5 mg by mouth 2 (two) times daily. What changed: Another medication with the  same name was removed. Continue taking this medication, and follow the directions you see here.   clopidogrel 75 MG tablet Commonly known as: PLAVIX Take 1 tablet (75 mg total) by mouth daily. What changed: how to take this   fluticasone 50 MCG/ACT nasal spray Commonly known as: FLONASE Place 1  spray into both nostrils daily.   furosemide 20 MG tablet Commonly known as: LASIX Take 20 mg by mouth daily.   gabapentin 300 MG capsule Commonly known as: NEURONTIN Take 600 mg by mouth 2 (two) times daily.   hydrOXYzine 50 MG tablet Commonly known as: ATARAX/VISTARIL Take 50 mg by mouth every 6 (six) hours as needed.   levothyroxine 75 MCG tablet Commonly known as: SYNTHROID Take 75 mcg by mouth daily.   losartan 100 MG tablet Commonly known as: COZAAR Take 100 mg by mouth daily.   nitroGLYCERIN 0.4 MG SL tablet Commonly known as: NITROSTAT Place 0.4 mg under the tongue every 5 (five) minutes as needed.   omeprazole 20 MG capsule Commonly known as: PRILOSEC Take 20 mg by mouth 2 (two) times daily.   ondansetron 8 MG disintegrating tablet Commonly known as: ZOFRAN-ODT DISSOLVE 1 TABLET(8 MG) ON THE TONGUE EVERY 6 HOURS AS NEEDED FOR NAUSEA OR VOMITING What changed: See the new instructions.   oxybutynin 5 MG 24 hr tablet Commonly known as: DITROPAN-XL Take 5 mg by mouth daily.   Oxycodone HCl 10 MG Tabs Take 10 mg by mouth 3 (three) times daily.   OxyCONTIN 15 mg 12 hr tablet Generic drug: oxyCODONE Take 15 mg by mouth 2 (two) times daily.   pantoprazole 40 MG tablet Commonly known as: PROTONIX Take 1 tablet (40 mg total) by mouth daily. Office visit for further refills   potassium chloride SA 20 MEQ tablet Commonly known as: KLOR-CON Take 1 tablet (20 mEq total) by mouth daily.        Follow-up Information     Zhou-Talbert, Ralene Bathe, MD. Schedule an appointment as soon as possible for a visit in 1 week(s).   Specialty: Family Medicine Contact information: [address] [place] [address] [phone]                Allergies  Allergen Reactions   Amoxicillin-Pot Clavulanate Anaphylaxis and Other (See Comments)    N/V/D   Seasonal Ic [Cholestatin]     Per pt not allergic to medication-has seasonal allergies     Consultations:    Procedures/Studies: CT Head Wo Contrast  Result Date: 03/09/2021 CLINICAL DATA:  Status post trauma. EXAM: CT HEAD WITHOUT CONTRAST TECHNIQUE: Contiguous axial images were obtained from the base of the skull through the vertex without intravenous contrast. COMPARISON:  None. FINDINGS: Brain: There is mild cerebral atrophy with widening of the extra-axial spaces and ventricular dilatation. There are areas of decreased attenuation within the white matter tracts of the supratentorial brain, consistent with microvascular disease changes. Vascular: No hyperdense vessel or unexpected calcification. Skull: Normal. Negative for fracture or focal lesion. Sinuses/Orbits: No acute finding. Other: There is mild left frontal scalp soft tissue swelling. IMPRESSION: Mild left frontal scalp soft tissue swelling without evidence of an acute fracture or acute intracranial abnormality. Electronically Signed   By: Aram Candela M.D.   On: 03/09/2021 19:44   CT Cervical Spine Wo Contrast  Result Date: 03/09/2021 CLINICAL DATA:  Status post fall. EXAM: CT CERVICAL SPINE WITHOUT CONTRAST TECHNIQUE: Multidetector CT imaging of the cervical spine was performed without intravenous contrast. Multiplanar CT  image reconstructions were also generated. COMPARISON:  None. FINDINGS: Alignment: Normal. Skull base and vertebrae: No acute fracture. No primary bone lesion or focal pathologic process. Soft tissues and spinal canal: No prevertebral fluid or swelling. No visible canal hematoma. Disc levels: Moderate severity endplate sclerosis and moderate to marked severity anterior osteophyte formation is seen at the levels of C5-C6 and C6-C7. Mild anterior osteophyte formation is also noted at the levels of C3-C4 and C4-C5. There is moderate severity narrowing of the anterior atlantoaxial articulation. Moderate to marked severity intervertebral disc space narrowing is seen at C6-C7. Moderate severity  intervertebral disc space narrowing is noted at C4-C5 and C5-C6. Bilateral marked severity multilevel facet joint hypertrophy is noted. Upper chest: Negative. Other: None. IMPRESSION: 1. Moderate severity multilevel degenerative changes, most prominent at the levels of C5-C6 and C6-C7. 2. No evidence of an acute fracture or subluxation. Electronically Signed   By: Aram Candela M.D.   On: 03/09/2021 19:47   MR CERVICAL SPINE WO CONTRAST  Result Date: 02/15/2021 CLINICAL DATA:  Cervical pain. EXAM: MRI CERVICAL SPINE WITHOUT CONTRAST TECHNIQUE: Multiplanar, multisequence MR imaging of the cervical spine was performed. No intravenous contrast was administered. COMPARISON:  Radiographs March 07, 2017. FINDINGS: Alignment: Reversal of the cervical curvature. Trace anterolisthesis of C3 over C4. Vertebrae: No fracture, evidence of discitis, or bone lesion. Endplate degenerative changes with anterior osteophytes from C4-5 through C6-7. Cord: Normal signal and morphology. Posterior Fossa, vertebral arteries, paraspinal tissues: Negative. Disc levels: C2-3: Uncovertebral and facet degenerative changes resulting in moderate right neural foraminal narrowing. No spinal canal stenosis. C3-4: Uncovertebral and facet degenerative changes resulting in moderate bilateral neural foraminal narrowing. No spinal canal stenosis. C4-5: From posterior disc osteophyte complex without significant spinal canal stenosis. Uncovertebral and facet degenerative changes resulting in moderate right and severe left neural foraminal narrowing. C5-6: Posterior disc osteophyte complex without significant spinal canal stenosis. Uncovertebral and facet degenerative changes resulting in moderate right and severe left neural foraminal narrowing. C6-7: Posterior disc osteophyte complex resulting in mild spinal canal stenosis. Uncovertebral and facet degenerative changes resulting in severe right and moderate left neural foraminal narrowing. C7-T1:  Uncovertebral and facet degenerative changes resulting in mild bilateral neural foraminal narrowing. No spinal canal stenosis. IMPRESSION: Degenerative changes of the cervical spine, more pronounced at the level of the facet and uncovertebral joints resulting in multilevel high-grade neural foraminal narrowing, as described above. No high-grade spinal canal stenosis. Electronically Signed   By: Baldemar Lenis M.D.   On: 02/15/2021 09:41      Subjective: She is feeling better.  Continues to have chronic back pain.  Wants to go home.  Discharge Exam: Vitals:   03/10/21 0800 03/10/21 0817 03/10/21 0840 03/10/21 0854  BP: (!) 120/45  (!) 165/73   Pulse: 67  75   Resp: 15     Temp:  98.7 F (37.1 C) 98.4 F (36.9 C)   TempSrc:  Oral Oral   SpO2: 91%  97% 94%  Weight:   77.1 kg   Height:   5\' 3"  (1.6 m)     General: Pt is alert, awake, not in acute distress Cardiovascular: RRR, S1/S2 +, no rubs, no gallops Respiratory: CTA bilaterally, no wheezing, no rhonchi Abdominal: Soft, NT, ND, bowel sounds + Extremities: no edema, no cyanosis    The results of significant diagnostics from this hospitalization (including imaging, microbiology, ancillary and laboratory) are listed below for reference.     Microbiology: Recent Results (from the past  240 hour(s))  Resp Panel by RT-PCR (Flu A&B, Covid) Nasopharyngeal Swab     Status: None   Collection Time: 03/10/21  2:14 AM   Specimen: Nasopharyngeal Swab; Nasopharyngeal(NP) swabs in vial transport medium  Result Value Ref Range Status   SARS Coronavirus 2 by RT PCR NEGATIVE NEGATIVE Final    Comment: (NOTE) SARS-CoV-2 target nucleic acids are NOT DETECTED.  The SARS-CoV-2 RNA is generally detectable in upper respiratory specimens during the acute phase of infection. The lowest concentration of SARS-CoV-2 viral copies this assay can detect is 138 copies/mL. A negative result does not preclude SARS-Cov-2 infection and should  not be used as the sole basis for treatment or other patient management decisions. A negative result may occur with  improper specimen collection/handling, submission of specimen other than nasopharyngeal swab, presence of viral mutation(s) within the areas targeted by this assay, and inadequate number of viral copies(<138 copies/mL). A negative result must be combined with clinical observations, patient history, and epidemiological information. The expected result is Negative.  Fact Sheet for Patients:  BloggerCourse.com  Fact Sheet for Healthcare Providers:  SeriousBroker.it  This test is no t yet approved or cleared by the [place] FDA and  has been authorized for detection and/or diagnosis of SARS-CoV-2 by FDA under an Emergency Use Authorization (EUA). This EUA will remain  in effect (meaning this test can be used) for the duration of the COVID-19 declaration under Section 564(b)(1) of the Act, 21 U.S.C.section 360bbb-3(b)(1), unless the authorization is terminated  or revoked sooner.       Influenza A by PCR NEGATIVE NEGATIVE Final   Influenza B by PCR NEGATIVE NEGATIVE Final    Comment: (NOTE) The Xpert Xpress SARS-CoV-2/FLU/RSV plus assay is intended as an aid in the diagnosis of influenza from Nasopharyngeal swab specimens and should not be used as a sole basis for treatment. Nasal washings and aspirates are unacceptable for Xpert Xpress SARS-CoV-2/FLU/RSV testing.  Fact Sheet for Patients: BloggerCourse.com  Fact Sheet for Healthcare Providers: SeriousBroker.it  This test is not yet approved or cleared by the [place] FDA and has been authorized for detection and/or diagnosis of SARS-CoV-2 by FDA under an Emergency Use Authorization (EUA). This EUA will remain in effect (meaning this test can be used) for the duration of the COVID-19 declaration under  Section 564(b)(1) of the Act, 21 U.S.C. section 360bbb-3(b)(1), unless the authorization is terminated or revoked.  Performed at [facility], [address] [place], [place] [address]      Labs: BNP (last 3 results) Recent Labs    05/23/20 1715  BNP 163.0*   Basic Metabolic Panel: Recent Labs  Lab 03/09/21 1902 03/09/21 1903 03/10/21 0319  NA 134*  --  138  K 2.1*  --  3.8  CL 89*  --  100  CO2 35*  --  31  GLUCOSE 119*  --  97  BUN 17  --  11  CREATININE 1.00  --  0.72  CALCIUM 9.1  --  8.6*  MG  --  1.8 1.9   Liver Function Tests: No results for input(s): AST, ALT, ALKPHOS, BILITOT, PROT, ALBUMIN in the last 168 hours. No results for input(s): LIPASE, AMYLASE in the last 168 hours. No results for input(s): AMMONIA in the last 168 hours. CBC: Recent Labs  Lab 03/09/21 1902 03/10/21 0319  WBC 9.5 9.9  HGB 13.1 12.0  HCT 38.0 34.2*  MCV 86.6 86.8  PLT 333 312   Cardiac Enzymes: No results  for input(s): CKTOTAL, CKMB, CKMBINDEX, TROPONINI in the last 168 hours. BNP: Invalid input(s): POCBNP CBG: No results for input(s): GLUCAP in the last 168 hours. D-Dimer No results for input(s): DDIMER in the last 72 hours. Hgb A1c No results for input(s): HGBA1C in the last 72 hours. Lipid Profile No results for input(s): CHOL, HDL, LDLCALC, TRIG, CHOLHDL, LDLDIRECT in the last 72 hours. Thyroid function studies No results for input(s): TSH, T4TOTAL, T3FREE, THYROIDAB in the last 72 hours.  Invalid input(s): FREET3 Anemia work up No results for input(s): VITAMINB12, FOLATE, FERRITIN, TIBC, IRON, RETICCTPCT in the last 72 hours. Urinalysis    Component Value Date/Time   COLORURINE YELLOW 05/02/2018 1630   APPEARANCEUR CLEAR 05/02/2018 1630   LABSPEC 1.006 05/02/2018 1630   PHURINE 7.0 05/02/2018 1630   GLUCOSEU NEGATIVE 05/02/2018 1630   HGBUR NEGATIVE 05/02/2018 1630   BILIRUBINUR NEGATIVE 05/02/2018 1630   KETONESUR NEGATIVE 05/02/2018 1630   PROTEINUR  NEGATIVE 05/02/2018 1630   NITRITE NEGATIVE 05/02/2018 1630   LEUKOCYTESUR TRACE (A) 05/02/2018 1630   Sepsis Labs Invalid input(s): PROCALCITONIN,  WBC,  LACTICIDVEN Microbiology Recent Results (from the past 240 hour(s))  Resp Panel by RT-PCR (Flu A&B, Covid) Nasopharyngeal Swab     Status: None   Collection Time: 03/10/21  2:14 AM   Specimen: Nasopharyngeal Swab; Nasopharyngeal(NP) swabs in vial transport medium  Result Value Ref Range Status   SARS Coronavirus 2 by RT PCR NEGATIVE NEGATIVE Final    Comment: (NOTE) SARS-CoV-2 target nucleic acids are NOT DETECTED.  The SARS-CoV-2 RNA is generally detectable in upper respiratory specimens during the acute phase of infection. The lowest concentration of SARS-CoV-2 viral copies this assay can detect is 138 copies/mL. A negative result does not preclude SARS-Cov-2 infection and should not be used as the sole basis for treatment or other patient management decisions. A negative result may occur with  improper specimen collection/handling, submission of specimen other than nasopharyngeal swab, presence of viral mutation(s) within the areas targeted by this assay, and inadequate number of viral copies(<138 copies/mL). A negative result must be combined with clinical observations, patient history, and epidemiological information. The expected result is Negative.  Fact Sheet for Patients:  BloggerCourse.com  Fact Sheet for Healthcare Providers:  SeriousBroker.it  This test is no t yet approved or cleared by the [place] FDA and  has been authorized for detection and/or diagnosis of SARS-CoV-2 by FDA under an Emergency Use Authorization (EUA). This EUA will remain  in effect (meaning this test can be used) for the duration of the COVID-19 declaration under Section 564(b)(1) of the Act, 21 U.S.C.section 360bbb-3(b)(1), unless the authorization is terminated  or revoked sooner.        Influenza A by PCR NEGATIVE NEGATIVE Final   Influenza B by PCR NEGATIVE NEGATIVE Final    Comment: (NOTE) The Xpert Xpress SARS-CoV-2/FLU/RSV plus assay is intended as an aid in the diagnosis of influenza from Nasopharyngeal swab specimens and should not be used as a sole basis for treatment. Nasal washings and aspirates are unacceptable for Xpert Xpress SARS-CoV-2/FLU/RSV testing.  Fact Sheet for Patients: BloggerCourse.com  Fact Sheet for Healthcare Providers: SeriousBroker.it  This test is not yet approved or cleared by the [place] FDA and has been authorized for detection and/or diagnosis of SARS-CoV-2 by FDA under an Emergency Use Authorization (EUA). This EUA will remain in effect (meaning this test can be used) for the duration of the COVID-19 declaration under Section 564(b)(1) of the Act, 21 U.S.C.  section 360bbb-3(b)(1), unless the authorization is terminated or revoked.  Performed at [facility], [address] [place], [place] [address]      Time coordinating discharge:  SIGNED:   Erick Blinks, MD  [facility] 03/10/2021, 8:29 PM   If 7PM-7AM, please contact night-coverage [email or web address]

## 2021-04-26 ENCOUNTER — Other Ambulatory Visit (HOSPITAL_COMMUNITY): Payer: Self-pay | Admitting: Family Medicine

## 2021-04-26 ENCOUNTER — Other Ambulatory Visit: Payer: Self-pay | Admitting: Family Medicine

## 2021-04-26 DIAGNOSIS — Z720 Tobacco use: Secondary | ICD-10-CM

## 2021-05-08 ENCOUNTER — Encounter (HOSPITAL_COMMUNITY): Payer: Self-pay

## 2021-05-08 ENCOUNTER — Emergency Department (HOSPITAL_COMMUNITY): Payer: Medicare Other

## 2021-05-08 ENCOUNTER — Inpatient Hospital Stay (HOSPITAL_COMMUNITY)
Admission: EM | Admit: 2021-05-08 | Discharge: 2021-05-11 | DRG: 871 | Disposition: A | Discharge status: Home / Self Care | Payer: Medicare Other | Attending: Internal Medicine | Admitting: Internal Medicine

## 2021-05-08 ENCOUNTER — Other Ambulatory Visit: Payer: Self-pay

## 2021-05-08 DIAGNOSIS — I5032 Chronic diastolic (congestive) heart failure: Secondary | ICD-10-CM | POA: Diagnosis present

## 2021-05-08 DIAGNOSIS — J181 Lobar pneumonia, unspecified organism: Secondary | ICD-10-CM | POA: Diagnosis not present

## 2021-05-08 DIAGNOSIS — Z28311 Partially vaccinated for covid-19: Secondary | ICD-10-CM | POA: Diagnosis not present

## 2021-05-08 DIAGNOSIS — J441 Chronic obstructive pulmonary disease with (acute) exacerbation: Secondary | ICD-10-CM | POA: Diagnosis present

## 2021-05-08 DIAGNOSIS — F17210 Nicotine dependence, cigarettes, uncomplicated: Secondary | ICD-10-CM | POA: Diagnosis present

## 2021-05-08 DIAGNOSIS — J9601 Acute respiratory failure with hypoxia: Secondary | ICD-10-CM | POA: Diagnosis present

## 2021-05-08 DIAGNOSIS — J189 Pneumonia, unspecified organism: Secondary | ICD-10-CM | POA: Diagnosis present

## 2021-05-08 DIAGNOSIS — Z20822 Contact with and (suspected) exposure to covid-19: Secondary | ICD-10-CM | POA: Diagnosis present

## 2021-05-08 DIAGNOSIS — F1120 Opioid dependence, uncomplicated: Secondary | ICD-10-CM | POA: Diagnosis present

## 2021-05-08 DIAGNOSIS — F419 Anxiety disorder, unspecified: Secondary | ICD-10-CM | POA: Diagnosis present

## 2021-05-08 DIAGNOSIS — G894 Chronic pain syndrome: Secondary | ICD-10-CM | POA: Diagnosis present

## 2021-05-08 DIAGNOSIS — Z7901 Long term (current) use of anticoagulants: Secondary | ICD-10-CM

## 2021-05-08 DIAGNOSIS — K219 Gastro-esophageal reflux disease without esophagitis: Secondary | ICD-10-CM | POA: Diagnosis present

## 2021-05-08 DIAGNOSIS — I10 Essential (primary) hypertension: Secondary | ICD-10-CM

## 2021-05-08 DIAGNOSIS — Z79890 Hormone replacement therapy (postmenopausal): Secondary | ICD-10-CM

## 2021-05-08 DIAGNOSIS — Z79899 Other long term (current) drug therapy: Secondary | ICD-10-CM | POA: Diagnosis not present

## 2021-05-08 DIAGNOSIS — Z8249 Family history of ischemic heart disease and other diseases of the circulatory system: Secondary | ICD-10-CM | POA: Diagnosis not present

## 2021-05-08 DIAGNOSIS — E039 Hypothyroidism, unspecified: Secondary | ICD-10-CM | POA: Diagnosis present

## 2021-05-08 DIAGNOSIS — Z720 Tobacco use: Secondary | ICD-10-CM

## 2021-05-08 DIAGNOSIS — I110 Hypertensive heart disease with heart failure: Secondary | ICD-10-CM | POA: Diagnosis present

## 2021-05-08 DIAGNOSIS — J440 Chronic obstructive pulmonary disease with acute lower respiratory infection: Secondary | ICD-10-CM | POA: Diagnosis present

## 2021-05-08 DIAGNOSIS — I252 Old myocardial infarction: Secondary | ICD-10-CM

## 2021-05-08 DIAGNOSIS — I2510 Atherosclerotic heart disease of native coronary artery without angina pectoris: Secondary | ICD-10-CM | POA: Diagnosis present

## 2021-05-08 DIAGNOSIS — Z7951 Long term (current) use of inhaled steroids: Secondary | ICD-10-CM

## 2021-05-08 DIAGNOSIS — A419 Sepsis, unspecified organism: Secondary | ICD-10-CM | POA: Diagnosis present

## 2021-05-08 DIAGNOSIS — E785 Hyperlipidemia, unspecified: Secondary | ICD-10-CM | POA: Diagnosis present

## 2021-05-08 DIAGNOSIS — Z880 Allergy status to penicillin: Secondary | ICD-10-CM

## 2021-05-08 DIAGNOSIS — J449 Chronic obstructive pulmonary disease, unspecified: Secondary | ICD-10-CM

## 2021-05-08 LAB — PHOSPHORUS: Phosphorus: 3.8 mg/dL (ref 2.5–4.6)

## 2021-05-08 LAB — CBC WITH DIFFERENTIAL/PLATELET
Abs Immature Granulocytes: 0.14 10*3/uL — ABNORMAL HIGH (ref 0.00–0.07)
Basophils Absolute: 0 10*3/uL (ref 0.0–0.1)
Basophils Relative: 0 %
Eosinophils Absolute: 0 10*3/uL (ref 0.0–0.5)
Eosinophils Relative: 0 %
HCT: 31.9 % — ABNORMAL LOW (ref 36.0–46.0)
Hemoglobin: 10.5 g/dL — ABNORMAL LOW (ref 12.0–15.0)
Immature Granulocytes: 1 %
Lymphocytes Relative: 7 %
Lymphs Abs: 1.2 10*3/uL (ref 0.7–4.0)
MCH: 29.7 pg (ref 26.0–34.0)
MCHC: 32.9 g/dL (ref 30.0–36.0)
MCV: 90.1 fL (ref 80.0–100.0)
Monocytes Absolute: 1 10*3/uL (ref 0.1–1.0)
Monocytes Relative: 6 %
Neutro Abs: 13.5 10*3/uL — ABNORMAL HIGH (ref 1.7–7.7)
Neutrophils Relative %: 86 %
Platelets: 296 10*3/uL (ref 150–400)
RBC: 3.54 MIL/uL — ABNORMAL LOW (ref 3.87–5.11)
RDW: 13.9 % (ref 11.5–15.5)
WBC: 15.9 10*3/uL — ABNORMAL HIGH (ref 4.0–10.5)
nRBC: 0.1 % (ref 0.0–0.2)

## 2021-05-08 LAB — RESP PANEL BY RT-PCR (FLU A&B, COVID) ARPGX2
Influenza A by PCR: NEGATIVE
Influenza B by PCR: NEGATIVE
SARS Coronavirus 2 by RT PCR: NEGATIVE

## 2021-05-08 LAB — BASIC METABOLIC PANEL
Anion gap: 9 (ref 5–15)
BUN: 17 mg/dL (ref 8–23)
CO2: 25 mmol/L (ref 22–32)
Calcium: 8.5 mg/dL — ABNORMAL LOW (ref 8.9–10.3)
Chloride: 105 mmol/L (ref 98–111)
Creatinine, Ser: 0.96 mg/dL (ref 0.44–1.00)
GFR, Estimated: >60 mL/min (ref >60)
Glucose, Bld: 129 mg/dL — ABNORMAL HIGH (ref 70–99)
Potassium: 3.9 mmol/L (ref 3.5–5.1)
Sodium: 139 mmol/L (ref 135–145)

## 2021-05-08 LAB — MAGNESIUM: Magnesium: 1.7 mg/dL (ref 1.7–2.4)

## 2021-05-08 LAB — TSH: TSH: 0.542 u[IU]/mL (ref 0.350–4.500)

## 2021-05-08 LAB — LACTIC ACID, PLASMA: Lactic Acid, Venous: 0.9 mmol/L (ref 0.5–1.9)

## 2021-05-08 LAB — BRAIN NATRIURETIC PEPTIDE: B Natriuretic Peptide: 575 pg/mL — ABNORMAL HIGH (ref 0.0–100.0)

## 2021-05-08 IMAGING — DX DG CHEST 2V
2 series · 2 of 2 positions shown · non-contrast
Comparison: [DATE]

CLINICAL DATA: Shortness of breath.  Pneumonia.

EXAM:
CHEST - 2 VIEW

[chest lat]
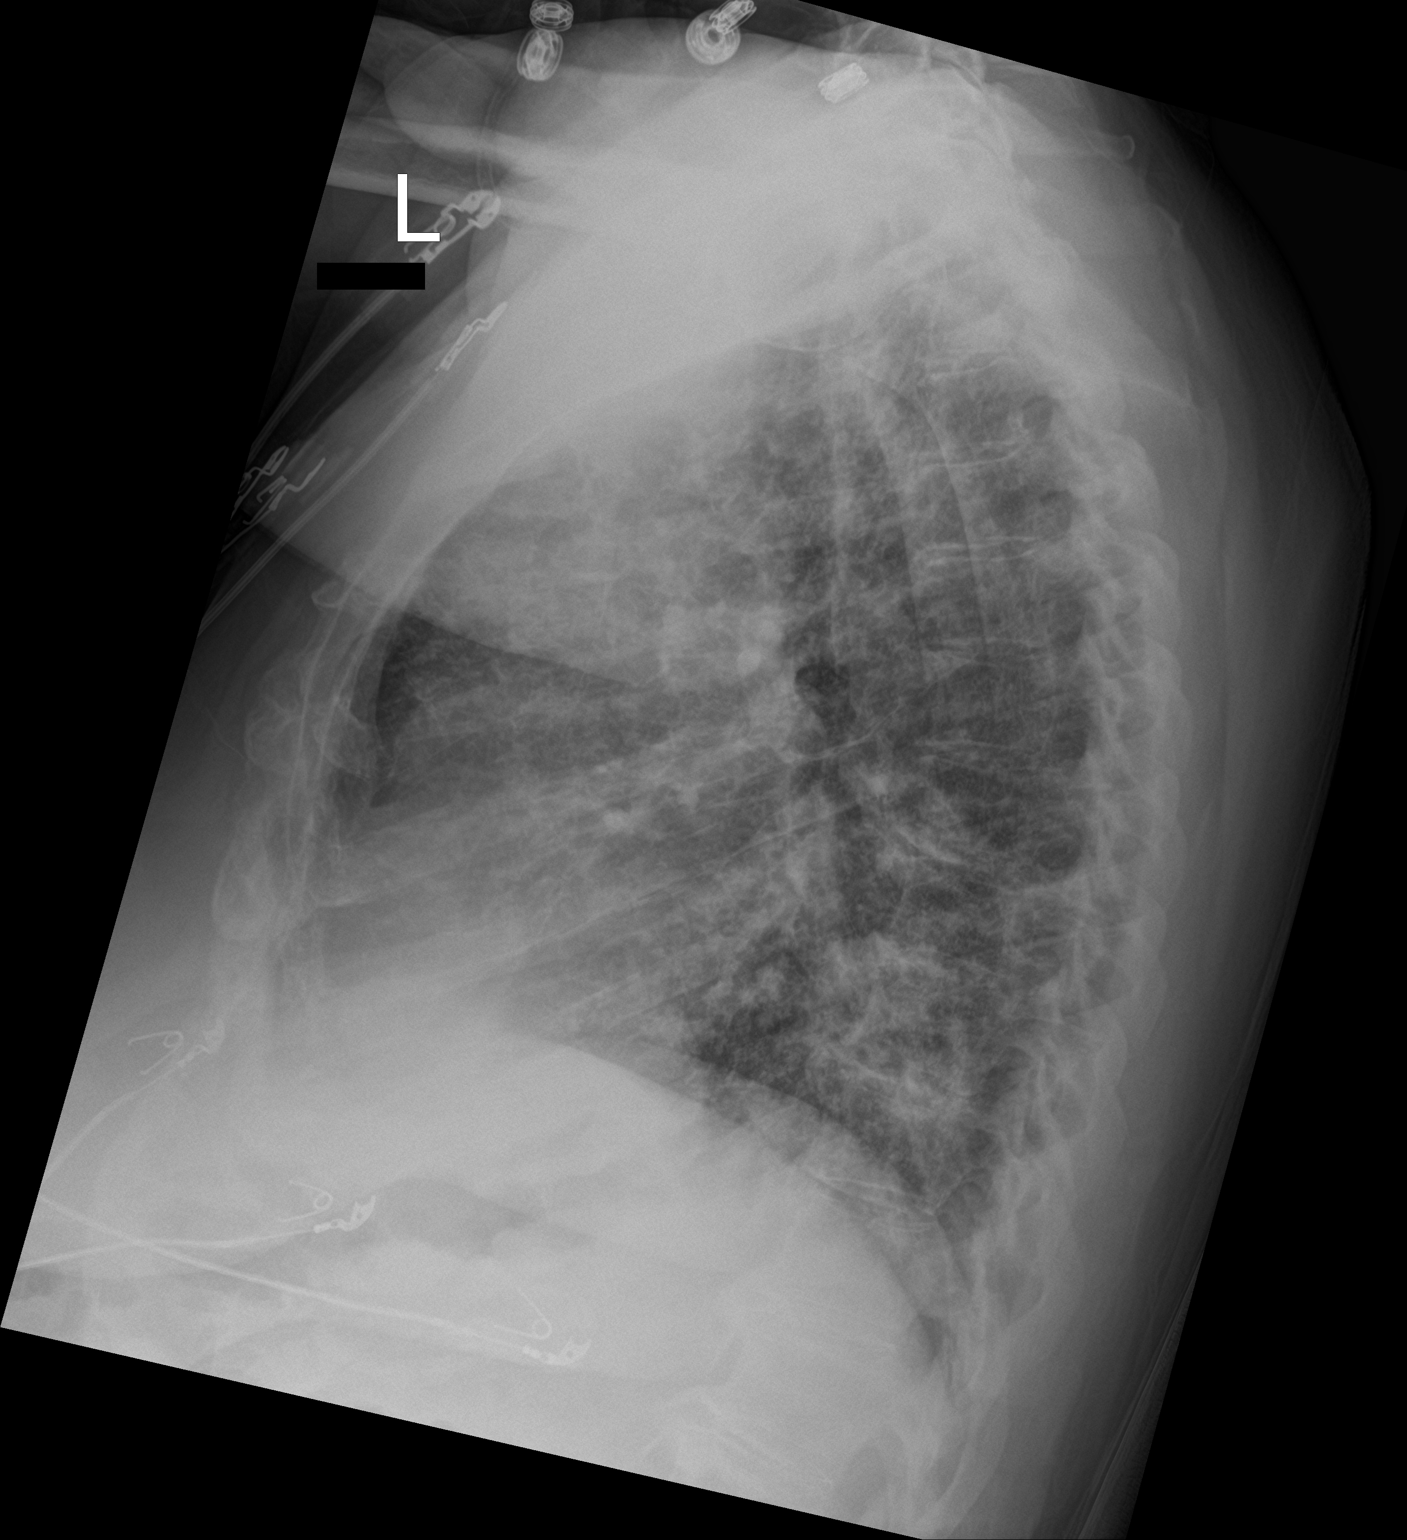

[chest ap]
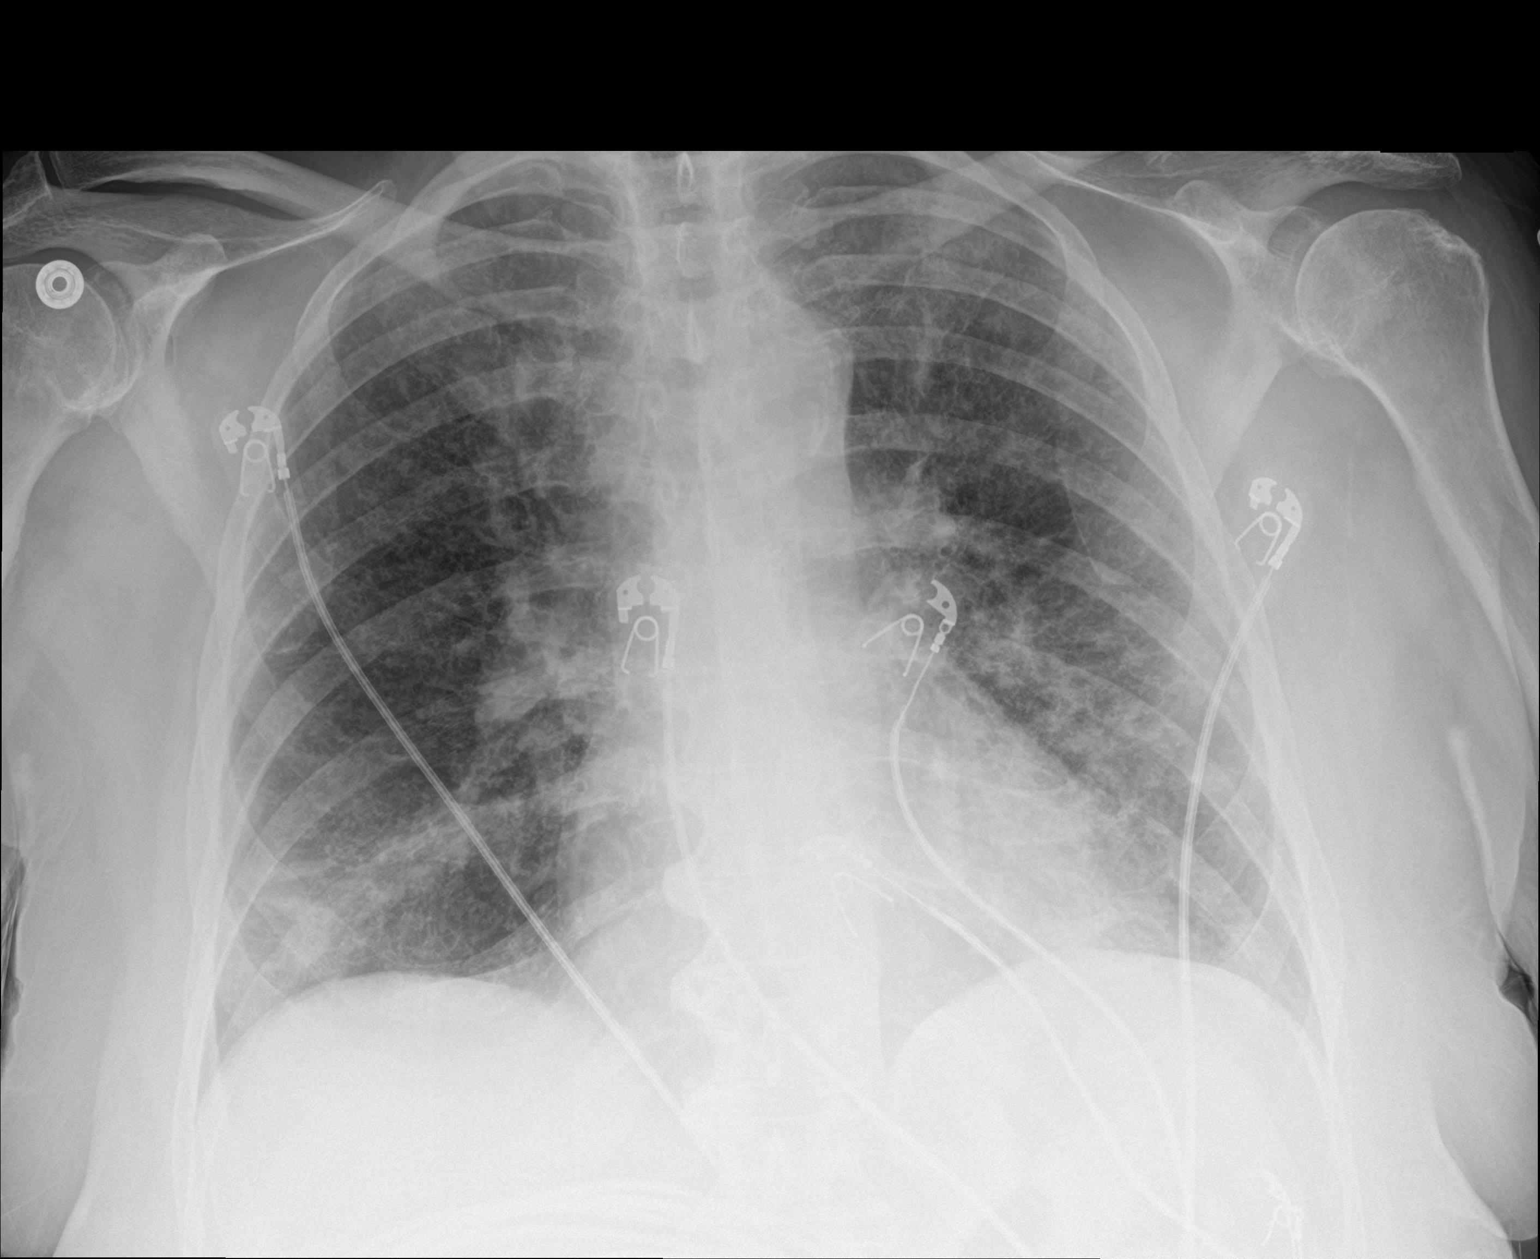

[2 of 2 positions shown; findings below may reference images not displayed]

FINDINGS: Patchy airspace disease identified left mid and lower lung and right
base. Interstitial markings are diffusely coarsened with chronic
features. Cardiopericardial silhouette is at upper limits of normal
for size. The visualized bony structures of the thorax show no acute
abnormality. Telemetry leads overlie the chest.
IMPRESSION: Patchy bilateral airspace disease compatible with multifocal
pneumonia.

## 2021-05-08 MED ORDER — CLOPIDOGREL BISULFATE 75 MG PO TABS
75.0000 mg | ORAL_TABLET | Freq: Every day | ORAL | Status: DC
  Administered 2021-05-09 – 2021-05-11 (×3): 75 mg via ORAL
  Filled 2021-05-08 (×3): qty 1

## 2021-05-08 MED ORDER — OXYCODONE HCL 5 MG PO TABS
10.0000 mg | ORAL_TABLET | Freq: Three times a day (TID) | ORAL | Status: DC | PRN
  Administered 2021-05-08 – 2021-05-11 (×8): 10 mg via ORAL
  Filled 2021-05-08 (×9): qty 2

## 2021-05-08 MED ORDER — FUROSEMIDE 40 MG PO TABS: 20.0000 mg | ORAL_TABLET | Freq: Every day | ORAL | Status: DC

## 2021-05-08 MED ORDER — POTASSIUM CHLORIDE CRYS ER 20 MEQ PO TBCR
20.0000 meq | EXTENDED_RELEASE_TABLET | Freq: Every day | ORAL | Status: DC
  Administered 2021-05-09 – 2021-05-11 (×3): 20 meq via ORAL
  Filled 2021-05-08 (×3): qty 1

## 2021-05-08 MED ORDER — ATORVASTATIN CALCIUM 40 MG PO TABS
80.0000 mg | ORAL_TABLET | Freq: Every day | ORAL | Status: DC
  Administered 2021-05-08 – 2021-05-11 (×4): 80 mg via ORAL
  Filled 2021-05-08 (×4): qty 2

## 2021-05-08 MED ORDER — NICOTINE 21 MG/24HR TD PT24
21.0000 mg | MEDICATED_PATCH | Freq: Once | TRANSDERMAL | Status: AC
  Administered 2021-05-08: 16:00:00 21 mg via TRANSDERMAL
  Filled 2021-05-08: qty 1

## 2021-05-08 MED ORDER — PANTOPRAZOLE SODIUM 40 MG PO TBEC
40.0000 mg | DELAYED_RELEASE_TABLET | Freq: Every day | ORAL | Status: DC
  Administered 2021-05-08 – 2021-05-11 (×4): 40 mg via ORAL
  Filled 2021-05-08 (×4): qty 1

## 2021-05-08 MED ORDER — LEVOFLOXACIN IN D5W 750 MG/150ML IV SOLN
750.0000 mg | Freq: Once | INTRAVENOUS | Status: AC
  Administered 2021-05-08: 16:00:00 750 mg via INTRAVENOUS
  Filled 2021-05-08: qty 150

## 2021-05-08 MED ORDER — ACETAMINOPHEN 325 MG PO TABS
650.0000 mg | ORAL_TABLET | Freq: Four times a day (QID) | ORAL | Status: DC | PRN
  Filled 2021-05-08: qty 2

## 2021-05-08 MED ORDER — BUDESONIDE 0.5 MG/2ML IN SUSP
0.5000 mg | Freq: Two times a day (BID) | RESPIRATORY_TRACT | Status: DC
  Administered 2021-05-08 – 2021-05-10 (×5): 0.5 mg via RESPIRATORY_TRACT
  Filled 2021-05-08 (×6): qty 2

## 2021-05-08 MED ORDER — LEVOTHYROXINE SODIUM 75 MCG PO TABS
75.0000 ug | ORAL_TABLET | Freq: Every day | ORAL | Status: DC
  Administered 2021-05-09 – 2021-05-11 (×3): 75 ug via ORAL
  Filled 2021-05-08 (×4): qty 1

## 2021-05-08 MED ORDER — LOSARTAN POTASSIUM 50 MG PO TABS
100.0000 mg | ORAL_TABLET | Freq: Every day | ORAL | Status: DC
  Administered 2021-05-09 – 2021-05-10 (×2): 100 mg via ORAL
  Filled 2021-05-08 (×2): qty 2

## 2021-05-08 MED ORDER — IPRATROPIUM-ALBUTEROL 0.5-2.5 (3) MG/3ML IN SOLN: 3.0000 mL | RESPIRATORY_TRACT | Status: DC | PRN

## 2021-05-08 MED ORDER — ENOXAPARIN SODIUM 40 MG/0.4ML IJ SOSY
40.0000 mg | PREFILLED_SYRINGE | INTRAMUSCULAR | Status: DC
  Administered 2021-05-08 – 2021-05-10 (×3): 40 mg via SUBCUTANEOUS
  Filled 2021-05-08 (×3): qty 0.4

## 2021-05-08 MED ORDER — CARVEDILOL 12.5 MG PO TABS
12.5000 mg | ORAL_TABLET | Freq: Two times a day (BID) | ORAL | Status: DC
  Administered 2021-05-08 – 2021-05-11 (×6): 12.5 mg via ORAL
  Filled 2021-05-08 (×6): qty 1

## 2021-05-08 MED ORDER — ACETAMINOPHEN 650 MG RE SUPP: 650.0000 mg | Freq: Four times a day (QID) | RECTAL | Status: DC | PRN

## 2021-05-08 MED ORDER — FLUTICASONE PROPIONATE 50 MCG/ACT NA SUSP
1.0000 | Freq: Every day | NASAL | Status: DC
  Administered 2021-05-08 – 2021-05-11 (×4): 1 via NASAL
  Filled 2021-05-08 (×2): qty 16

## 2021-05-08 MED ORDER — IPRATROPIUM-ALBUTEROL 0.5-2.5 (3) MG/3ML IN SOLN
3.0000 mL | Freq: Once | RESPIRATORY_TRACT | Status: AC
  Administered 2021-05-08: 16:00:00 3 mL via RESPIRATORY_TRACT
  Filled 2021-05-08: qty 3

## 2021-05-08 MED ORDER — DM-GUAIFENESIN ER 30-600 MG PO TB12
1.0000 | ORAL_TABLET | Freq: Two times a day (BID) | ORAL | Status: DC
  Administered 2021-05-09 – 2021-05-11 (×5): 1 via ORAL
  Filled 2021-05-08 (×6): qty 1

## 2021-05-08 MED ORDER — ONDANSETRON HCL 4 MG/2ML IJ SOLN: 4.0000 mg | Freq: Four times a day (QID) | INTRAMUSCULAR | Status: DC | PRN

## 2021-05-08 MED ORDER — LEVOFLOXACIN IN D5W 750 MG/150ML IV SOLN
750.0000 mg | INTRAVENOUS | Status: DC
  Administered 2021-05-09 – 2021-05-10 (×2): 750 mg via INTRAVENOUS
  Filled 2021-05-08 (×2): qty 150

## 2021-05-08 MED ORDER — FUROSEMIDE 40 MG PO TABS
40.0000 mg | ORAL_TABLET | Freq: Every day | ORAL | Status: DC
  Administered 2021-05-09 – 2021-05-11 (×3): 40 mg via ORAL
  Filled 2021-05-08 (×3): qty 1

## 2021-05-08 MED ORDER — OXYBUTYNIN CHLORIDE ER 5 MG PO TB24
5.0000 mg | ORAL_TABLET | Freq: Every day | ORAL | Status: DC
  Administered 2021-05-09 – 2021-05-11 (×3): 5 mg via ORAL
  Filled 2021-05-08 (×3): qty 1

## 2021-05-08 MED ORDER — ARFORMOTEROL TARTRATE 15 MCG/2ML IN NEBU
15.0000 ug | INHALATION_SOLUTION | Freq: Two times a day (BID) | RESPIRATORY_TRACT | Status: DC
  Administered 2021-05-08 – 2021-05-10 (×5): 15 ug via RESPIRATORY_TRACT
  Filled 2021-05-08 (×6): qty 2

## 2021-05-08 MED ORDER — OXYCODONE HCL 5 MG PO TABS
10.0000 mg | ORAL_TABLET | Freq: Once | ORAL | Status: AC
  Administered 2021-05-08: 16:00:00 10 mg via ORAL
  Filled 2021-05-08: qty 2

## 2021-05-08 MED ORDER — PREDNISONE 20 MG PO TABS: 40.0000 mg | ORAL_TABLET | Freq: Every day | ORAL | Status: DC

## 2021-05-08 MED ORDER — METHYLPREDNISOLONE SODIUM SUCC 125 MG IJ SOLR
125.0000 mg | Freq: Once | INTRAMUSCULAR | Status: AC
  Administered 2021-05-08: 16:00:00 125 mg via INTRAVENOUS
  Filled 2021-05-08: qty 2

## 2021-05-08 MED ORDER — IPRATROPIUM-ALBUTEROL 0.5-2.5 (3) MG/3ML IN SOLN
3.0000 mL | Freq: Three times a day (TID) | RESPIRATORY_TRACT | Status: DC
  Administered 2021-05-08 – 2021-05-09 (×3): 3 mL via RESPIRATORY_TRACT
  Filled 2021-05-08 (×3): qty 3

## 2021-05-08 MED ORDER — SODIUM CHLORIDE 0.9 % IV SOLN: INTRAVENOUS | Status: AC

## 2021-05-08 MED ORDER — METHYLPREDNISOLONE SODIUM SUCC 40 MG IJ SOLR
40.0000 mg | Freq: Two times a day (BID) | INTRAMUSCULAR | Status: AC
  Administered 2021-05-08 – 2021-05-10 (×4): 40 mg via INTRAVENOUS
  Filled 2021-05-08 (×4): qty 1

## 2021-05-08 MED ORDER — OXYCODONE HCL ER 15 MG PO T12A
15.0000 mg | EXTENDED_RELEASE_TABLET | Freq: Two times a day (BID) | ORAL | Status: DC
  Administered 2021-05-08 – 2021-05-11 (×6): 15 mg via ORAL
  Filled 2021-05-08 (×6): qty 1

## 2021-05-08 MED ORDER — ONDANSETRON HCL 4 MG PO TABS: 4.0000 mg | ORAL_TABLET | Freq: Four times a day (QID) | ORAL | Status: DC | PRN

## 2021-05-08 MED ORDER — GABAPENTIN 300 MG PO CAPS
600.0000 mg | ORAL_CAPSULE | Freq: Two times a day (BID) | ORAL | Status: DC
  Administered 2021-05-08 – 2021-05-11 (×6): 600 mg via ORAL
  Filled 2021-05-08 (×6): qty 2

## 2021-05-08 NOTE — Progress Notes (Signed)
1755 Patient declined nebulizer at this time as she wanted to eat dinner. RT will attempt administration a at later time.
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
## 2021-05-08 NOTE — Progress Notes (Signed)
Pharmacy Antibiotic Note  Jessica Bell is a 60 y.o. female admitted on 05/08/2021 with pneumonia.  Pharmacy has been consulted for levofloxacin dosing.  Plan: Levofloxacin 750mg  IV q24h  Height: 5\' 3"  (160 cm) Weight: 74.8 kg (165 lb) IBW/kg (Calculated) : 52.4  Temp (24hrs), Avg:98 F (36.7 C), Min:98 F (36.7 C), Max:98 F (36.7 C)  Recent Labs  Lab 05/08/21 1509 05/08/21 1516 05/08/21 1532  WBC  --  15.9*  --   CREATININE  --   --  0.96  LATICACIDVEN 0.9  --   --     Estimated Creatinine Clearance: 59.7 mL/min (by C-G formula based on SCr of 0.96 mg/dL).    Allergies  Allergen Reactions   Amoxicillin-Pot Clavulanate Anaphylaxis and Other (See Comments)    N/V/D   Seasonal Ic [Cholestatin]     Per pt not allergic to medication-has seasonal allergies    Antimicrobials this admission: 05/08/21 levofloxacin >>  Microbiology results: 12/26 BCx: sent 12/26 Resp Panel: negative    Thank you for allowing pharmacy to be a part of this patients care.  1/27 Ephrem Carrick 05/08/2021 5:25 PM
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
## 2021-05-08 NOTE — ED Triage Notes (Signed)
Pt brought in by [facility] with c/o SOB that worsened overnight. Pt dx with PNA and placed on Doxycycline and Prednisone on Thursday. O2 sat 87% on RA with increase of O2 sat to 100% on 4L. BP 161/78, HR 67, temp 98.7 for EMS. EMS reports lower lung lobes are diminished.

## 2021-05-09 DIAGNOSIS — K219 Gastro-esophageal reflux disease without esophagitis: Secondary | ICD-10-CM

## 2021-05-09 LAB — CBC
HCT: 30.5 % — ABNORMAL LOW (ref 36.0–46.0)
Hemoglobin: 9.9 g/dL — ABNORMAL LOW (ref 12.0–15.0)
MCH: 28.8 pg (ref 26.0–34.0)
MCHC: 32.5 g/dL (ref 30.0–36.0)
MCV: 88.7 fL (ref 80.0–100.0)
Platelets: 265 10*3/uL (ref 150–400)
RBC: 3.44 MIL/uL — ABNORMAL LOW (ref 3.87–5.11)
RDW: 13.7 % (ref 11.5–15.5)
WBC: 10.5 10*3/uL (ref 4.0–10.5)
nRBC: 0 % (ref 0.0–0.2)

## 2021-05-09 LAB — BASIC METABOLIC PANEL
Anion gap: 8 (ref 5–15)
BUN: 15 mg/dL (ref 8–23)
CO2: 27 mmol/L (ref 22–32)
Calcium: 8.6 mg/dL — ABNORMAL LOW (ref 8.9–10.3)
Chloride: 106 mmol/L (ref 98–111)
Creatinine, Ser: 0.69 mg/dL (ref 0.44–1.00)
GFR, Estimated: >60 mL/min (ref >60)
Glucose, Bld: 134 mg/dL — ABNORMAL HIGH (ref 70–99)
Potassium: 4.2 mmol/L (ref 3.5–5.1)
Sodium: 141 mmol/L (ref 135–145)

## 2021-05-09 MED ORDER — BACLOFEN 10 MG PO TABS
10.0000 mg | ORAL_TABLET | Freq: Three times a day (TID) | ORAL | Status: DC | PRN
  Administered 2021-05-09 – 2021-05-11 (×7): 10 mg via ORAL
  Filled 2021-05-09 (×8): qty 1

## 2021-05-09 MED ORDER — IPRATROPIUM-ALBUTEROL 0.5-2.5 (3) MG/3ML IN SOLN
3.0000 mL | Freq: Three times a day (TID) | RESPIRATORY_TRACT | Status: DC
  Administered 2021-05-09 – 2021-05-10 (×4): 3 mL via RESPIRATORY_TRACT
  Filled 2021-05-09 (×5): qty 3

## 2021-05-09 MED ORDER — NICOTINE 21 MG/24HR TD PT24
21.0000 mg | MEDICATED_PATCH | Freq: Every day | TRANSDERMAL | Status: DC
  Administered 2021-05-09 – 2021-05-11 (×3): 21 mg via TRANSDERMAL
  Filled 2021-05-09 (×3): qty 1

## 2021-05-09 NOTE — Progress Notes (Signed)
Pt on 4lpm cann spo2 96% o2 decreased to 3lpm cann

## 2021-05-09 NOTE — Progress Notes (Signed)
PROGRESS NOTE    Jessica Bell  TJQ:[id number] DOB: Oct 05, 1960 DOA: 05/08/2021 PCP: Danna Hefty, DO    Chief Complaint  Patient presents with   Shortness of Breath    Brief Narrative:  Jessica Bell is a 60 y.o. female with medical history significant of asthma, tobacco abuse, anxiety, HTN, HLD, hypothyroidism, chronic diastolic HF and hx of MI (no stents); who presented to ED with complaints of SOB, dyspnea on exertion, increase LE swelling, fever and productive coughing spells. Symptoms has been present since 12/22 and despite outpatient tx provided by her PCP symptoms has continue worsening becoming more intense. Patient reported decrease appetite and also chills.   Partially vaccinated against Covid (one dose of Moderna vaccine only), no boosted.  COVID/influenza PCR in the ED has been done and negative.   ED Course: Chest x-ray demonstrating multifocal pneumonia, patient with elevated WBCs and acute respiratory failure with hypoxia (requiring 4 L nasal cannula supplementation.  Stable electrolytes and renal function.  Cultures taken and antibiotic has been started.  TRH has been contacted to place in the hospital for further evaluation and management of sepsis secondary to pneumonia.  Sepsis criteria has been met at time of admission with reports of fever at home temperature up to 101.2, elevated WBCs, elevated respiratory rate and pulse rate source of infection with infiltrates seen on x-ray.      Assessment & Plan: 1-acute respiratory failure with hypoxia in the setting of multifocal pneumonia -Afebrile, with improvement in her WBCs and expressing less difficulty breathing. -Still demonstrating difficulty speaking in full sentences, complaining of short winded sensation with activity and productive coughing spells. -Continue current IV antibiotics -Continue bronchodilator management and the use of steroids.  Patient with underlying history of COPD. -Will benefit of PFTs as  an outpatient -Continue the use of Brovana and Pulmicort.  2-chronic diastolic heart failure -At time of admission concern for mild exacerbation present. -Overall volume status has significantly improved. -No crackles appreciated on examination. -Trace edema only seen on today's exam. -Continue the use of diuretics -Follow daily weights, low-sodium diet and strict intake and output.  3-hypertension -Continue home antihypertensive agents -Patient advised to follow heart healthy diet.  4-hyperlipidemia -Continue statins.  5-hypothyroidism -Continue Synthroid  6-tobacco abuse -Cessation counseling has been provided -Nicotine patch has been ordered.  7-GERD -Continue PPI  8-chronic pain syndrome -Continue home dose of OxyContin as needed oxycodone for breakthrough. -Overall stable.  9-prior history of MI -Currently denying any chest pain -Continue the use of Plavix, carvedilol and Lipitor.  DVT prophylaxis: Lovenox Code Status: Full code. Family Communication: No family at bedside. Disposition:   Status is: Inpatient    Consultants:  None   Procedures:  See below for x-ray reports  Antimicrobials:  Levaquin   Subjective: Patient reports mild difficulty speaking in full sentences, productive coughing spells and short winded sensation with activity.  Significant bilateral expiratory wheezing appreciated on examination.  Currently requiring 3 L nasal cannula supplementation.  Objective: Vitals:   05/09/21 0712 05/09/21 0950 05/09/21 1413 05/09/21 1528  BP:  (!) 185/79 (!) 185/84   Pulse:  (!) 51 (!) 52   Resp:   15   Temp:   97.8 F (36.6 C)   TempSrc:   Oral   SpO2: 98%  98% 98%  Weight:      Height:        Intake/Output Summary (Last 24 hours) at 05/09/2021 1636 Last data filed at 05/09/2021 1507 Gross per 24 hour  Intake 420 ml  Output --  Net 420 ml   Filed Weights   05/08/21 1425 05/09/21 0425  Weight: 74.8 kg 80.6 kg     Examination:  General exam: Appears calmer and in not major distress; still having difficulty speaking in full sentences and complaining of productive coughing spells. Respiratory system: Fair air movement, no using accessory muscle.  Tachypneic with activity and demonstrating bilateral expiratory wheezing. Cardiovascular system: S1 & S2 heard, RRR. No JVD, murmurs, rubs, gallops or clicks.  Trace edema appreciated bilaterally. Gastrointestinal system: Abdomen is nondistended, soft and nontender. No organomegaly or masses felt. Normal bowel sounds heard. Central nervous system: Alert and oriented. No focal neurological deficits. Extremities: No cyanosis or clubbing. Skin: No petechiae. Psychiatry: Judgement and insight appear normal. Mood & affect appropriate.     Data Reviewed: I have personally reviewed following labs and imaging studies  CBC: Recent Labs  Lab 05/08/21 1516 05/09/21 0539  WBC 15.9* 10.5  NEUTROABS 13.5*  --   HGB 10.5* 9.9*  HCT 31.9* 30.5*  MCV 90.1 88.7  PLT 296 700    Basic Metabolic Panel: Recent Labs  Lab 05/08/21 1516 05/08/21 1532 05/09/21 0539  NA  --  139 141  K  --  3.9 4.2  CL  --  105 106  CO2  --  25 27  GLUCOSE  --  129* 134*  BUN  --  17 15  CREATININE  --  0.96 0.69  CALCIUM  --  8.5* 8.6*  MG 1.7  --   --   PHOS 3.8  --   --     GFR: Estimated Creatinine Clearance: 74.3 mL/min (by C-G formula based on SCr of 0.69 mg/dL).   Recent Results (from the past 240 hour(s))  Resp Panel by RT-PCR (Flu A&B, Covid) Nasopharyngeal Swab     Status: None   Collection Time: 05/08/21  3:10 PM   Specimen: Nasopharyngeal Swab; Nasopharyngeal(NP) swabs in vial transport medium  Result Value Ref Range Status   SARS Coronavirus 2 by RT PCR NEGATIVE NEGATIVE Final    Comment: (NOTE) SARS-CoV-2 target nucleic acids are NOT DETECTED.  The SARS-CoV-2 RNA is generally detectable in upper respiratory specimens during the acute phase of  infection. The lowest concentration of SARS-CoV-2 viral copies this assay can detect is 138 copies/mL. A negative result does not preclude SARS-Cov-2 infection and should not be used as the sole basis for treatment or other patient management decisions. A negative result may occur with  improper specimen collection/handling, submission of specimen other than nasopharyngeal swab, presence of viral mutation(s) within the areas targeted by this assay, and inadequate number of viral copies(<138 copies/mL). A negative result must be combined with clinical observations, patient history, and epidemiological information. The expected result is Negative.  Fact Sheet for Patients:  EntrepreneurPulse.com.au  Fact Sheet for Healthcare Providers:  IncredibleEmployment.be  This test is no t yet approved or cleared by the [place] FDA and  has been authorized for detection and/or diagnosis of SARS-CoV-2 by FDA under an Emergency Use Authorization (EUA). This EUA will remain  in effect (meaning this test can be used) for the duration of the COVID-19 declaration under Section 564(b)(1) of the Act, 21 U.S.C.section 360bbb-3(b)(1), unless the authorization is terminated  or revoked sooner.       Influenza A by PCR NEGATIVE NEGATIVE Final   Influenza B by PCR NEGATIVE NEGATIVE Final    Comment: (NOTE) The Xpert Xpress SARS-CoV-2/FLU/RSV plus assay is intended  as an aid in the diagnosis of influenza from Nasopharyngeal swab specimens and should not be used as a sole basis for treatment. Nasal washings and aspirates are unacceptable for Xpert Xpress SARS-CoV-2/FLU/RSV testing.  Fact Sheet for Patients: EntrepreneurPulse.com.au  Fact Sheet for Healthcare Providers: IncredibleEmployment.be  This test is not yet approved or cleared by the [place] FDA and has been authorized for detection and/or diagnosis of SARS-CoV-2  by FDA under an Emergency Use Authorization (EUA). This EUA will remain in effect (meaning this test can be used) for the duration of the COVID-19 declaration under Section 564(b)(1) of the Act, 21 U.S.C. section 360bbb-3(b)(1), unless the authorization is terminated or revoked.  Performed at [facility], [address], [place], [place] [address]   Culture, blood (routine x 2)     Status: None (Preliminary result)   Collection Time: 05/08/21  3:32 PM   Specimen: Left Antecubital; Blood  Result Value Ref Range Status   Specimen Description LEFT ANTECUBITAL  Final   Special Requests   Final    BOTTLES DRAWN AEROBIC AND ANAEROBIC Blood Culture adequate volume Performed at [facility], [address] [place], [place] [address]    Culture PENDING  Incomplete   Report Status PENDING  Incomplete  Culture, blood (routine x 2)     Status: None (Preliminary result)   Collection Time: 05/08/21  4:06 PM   Specimen: Right Antecubital; Blood  Result Value Ref Range Status   Specimen Description RIGHT ANTECUBITAL  Final   Special Requests   Final    BOTTLES DRAWN AEROBIC AND ANAEROBIC Blood Culture adequate volume Performed at [facility], [address] [place], [place] [address]    Culture PENDING  Incomplete   Report Status PENDING  Incomplete    Radiology Studies: DG Chest 2 View  Result Date: 05/08/2021 CLINICAL DATA:  Shortness of breath.  Pneumonia. EXAM: CHEST - 2 VIEW COMPARISON:  05/23/2020 FINDINGS: Patchy airspace disease identified left mid and lower lung and right base. Interstitial markings are diffusely coarsened with chronic features. Cardiopericardial silhouette is at upper limits of normal for size. The visualized bony structures of the thorax show no acute abnormality. Telemetry leads overlie the chest. IMPRESSION: Patchy bilateral airspace disease compatible with multifocal pneumonia. Electronically Signed   By: Misty Stanley M.D.   On: 05/08/2021 15:58     Scheduled  Meds:  arformoterol  15 mcg Nebulization BID   atorvastatin  80 mg Oral Daily   budesonide (PULMICORT) nebulizer solution  0.5 mg Nebulization BID   carvedilol  12.5 mg Oral BID   clopidogrel  75 mg Oral Daily   dextromethorphan-guaiFENesin  1 tablet Oral BID   enoxaparin (LOVENOX) injection  40 mg Subcutaneous Q24H   fluticasone  1 spray Each Nare Daily   furosemide  40 mg Oral Daily   gabapentin  600 mg Oral BID   ipratropium-albuterol  3 mL Nebulization Q8H   levothyroxine  75 mcg Oral Daily   losartan  100 mg Oral Daily   methylPREDNISolone (SOLU-MEDROL) injection  40 mg Intravenous Q12H   Followed by   Derrill Memo ON 05/10/2021] predniSONE  40 mg Oral Q breakfast   oxybutynin  5 mg Oral Daily   oxyCODONE  15 mg Oral BID   pantoprazole  40 mg Oral Daily   potassium chloride SA  20 mEq Oral Daily   Continuous Infusions:  levofloxacin (LEVAQUIN) IV       LOS: 1 day    Barton Dubois, MD [facility]   To contact the attending provider between 7A-7P or the covering provider during after hours 7P-7A, please log into the web site [email or web address] and access using universal [place] password for that web site. If you do not have the password, please call the hospital operator.  05/09/2021, 4:36 PM

## 2021-05-09 NOTE — Progress Notes (Signed)
°  Transition of Care [facility]) Screening Note   Patient Details  Name: Jessica Bell Date of Birth: 04/06/1961   Transition of Care [facility]) CM/SW Contact:    Leitha Bleak, RN Phone Number: 05/09/2021, 10:46 AM    Transition of Care Department [facility]) has reviewed patient and no TOC needs have been identified at this time. We will continue to monitor patient advancement through interdisciplinary progression rounds. If new patient transition needs arise, please place a TOC consult.

## 2021-05-10 DIAGNOSIS — J441 Chronic obstructive pulmonary disease with (acute) exacerbation: Secondary | ICD-10-CM

## 2021-05-10 DIAGNOSIS — J181 Lobar pneumonia, unspecified organism: Secondary | ICD-10-CM

## 2021-05-10 DIAGNOSIS — J449 Chronic obstructive pulmonary disease, unspecified: Secondary | ICD-10-CM

## 2021-05-10 LAB — URINALYSIS, COMPLETE (UACMP) WITH MICROSCOPIC
Bacteria, UA: NONE SEEN
Bilirubin Urine: NEGATIVE
Glucose, UA: NEGATIVE mg/dL
Hgb urine dipstick: NEGATIVE
Ketones, ur: NEGATIVE mg/dL
Leukocytes,Ua: NEGATIVE
Nitrite: NEGATIVE
Protein, ur: NEGATIVE mg/dL
Specific Gravity, Urine: 1.014 (ref 1.005–1.030)
pH: 7 (ref 5.0–8.0)

## 2021-05-10 LAB — STREP PNEUMONIAE URINARY ANTIGEN: Strep Pneumo Urinary Antigen: NEGATIVE

## 2021-05-10 MED ORDER — PREDNISONE 20 MG PO TABS: 50.0000 mg | ORAL_TABLET | Freq: Every day | ORAL | Status: DC

## 2021-05-10 MED ORDER — PREDNISONE 20 MG PO TABS
40.0000 mg | ORAL_TABLET | Freq: Once | ORAL | Status: AC
  Administered 2021-05-10: 21:00:00 40 mg via ORAL
  Filled 2021-05-10: qty 2

## 2021-05-10 MED ORDER — PREDNISONE 20 MG PO TABS
60.0000 mg | ORAL_TABLET | Freq: Every day | ORAL | Status: DC
  Administered 2021-05-11: 08:00:00 60 mg via ORAL
  Filled 2021-05-10: qty 3

## 2021-05-10 NOTE — Progress Notes (Signed)
Patient is stating that her PRN oxycodone is past due and I have tried to explain to her that her medication is every 8 hours and she just had her medication at 1344.  She stated that night shift last night overrode the order and gave it to her but I reviewed with her the times she has received her oxycodone and there is no time it has been given early. However, patient is now crying stating she cannot wait any longer to get her pain medication. I messaged MD and he stated that it could be given in 30 minutes from the time of his message back to me.

## 2021-05-10 NOTE — Progress Notes (Signed)
PROGRESS NOTE  CHANTAVIA BAZZLE DGL:[id number] DOB: [birth year] DOA: 05/08/2021 PCP: Danna Hefty, DO  Brief History:  Jessica Bell is a 60 y.o. female with medical history significant of asthma, tobacco abuse, anxiety, HTN, HLD, hypothyroidism, chronic diastolic HF and hx of MI (no stents); who presented to ED with complaints of SOB, dyspnea on exertion, increase LE swelling, fever and productive coughing spells. Symptoms has been present since 12/22 and despite outpatient tx provided by her PCP symptoms has continue worsening becoming more intense. Patient reported decrease appetite and also chills.   Partially vaccinated against Covid (one dose of Moderna vaccine only), no boosted.  COVID/influenza PCR in the ED has been done and negative.   ED Course: Chest x-ray demonstrating multifocal pneumonia, patient with elevated WBCs and acute respiratory failure with hypoxia (requiring 4 L nasal cannula supplementation.  Stable electrolytes and renal function.  Cultures taken and antibiotic has been started.  TR[facility] has been contacted to place in the hospital for further evaluation and management of sepsis secondary to pneumonia.  Sepsis criteria has been met at time of admission with reports of fever at home temperature up to 101.2, elevated WBCs, elevated respiratory rate and pulse rate source of infection with infiltrates seen on x-ray.   Assessment/Plan: acute respiratory failure with hypoxia in the setting of multifocal pneumonia and COPD exacerbation -Afebrile, with improvement in her WBCs and expressing less difficulty breathing. -Still demonstrating difficulty speaking in full sentences, complaining of short winded sensation with activity and productive coughing spells. -Continue current IV antibiotics -Continue bronchodilator management and the use of steroids.  Patient with underlying history of COPD. -Will benefit of PFTs as an outpatient -Continue Brovana and Pulmicort.    chronic diastolic heart failure -appear clinically euvolemic -No crackles appreciated on examination. -Trace edema only seen on today's exam. -Continue po furosemide -Follow daily weights, low-sodium diet and strict intake and output.   hypertension -Continue coreg and losartan -Patient advised to follow heart healthy diet.   hyperlipidemia -Continue statins.   hypothyroidism -Continue Synthroid   tobacco abuse -Cessation counseling has been provided -Nicotine patch has been ordered.   GERD -Continue PPI   chronic pain syndrome -Continue home dose of OxyContin as needed oxycodone for breakthrough. -PDMP reviewed--she receives monthly OxyContin, oxy IR, clonazepam   CAD/prior history of MI -Currently denying any chest pain -Continue the use of Plavix, carvedilol and Lipitor.   Augmentin Intolerance -diarrhea -tolerates cephalosporins        Family Communication:   Family at bedside  Consultants:  none  Code Status:  FULL  DVT Prophylaxis:  [place] Lovenox   Procedures: As Listed in Progress Note Above  Antibiotics: Levoflox `12/26>>      Subjective: She is breathing better, but still has sob with exertion.  Denies f/c, n/v/d  Objective: Vitals:   05/10/21 0726 05/10/21 0849 05/10/21 1300 05/10/21 1346  BP:   124/69   Pulse:   70   Resp:   16   Temp:   97.9 F (36.6 C)   TempSrc:   Oral   SpO2: 97%   98%  Weight:  74.4 kg    Height:        Intake/Output Summary (Last 24 hours) at 05/10/2021 1621 Last data filed at 05/10/2021 1300 Gross per 24 hour  Intake 0 ml  Output --  Net 0 ml   Weight change: 0.499 kg Exam:  General:  Pt is alert,  follows commands appropriately, not in acute distress HEENT: No icterus, No thrush, No neck mass, [place]/AT Cardiovascular: RRR, S1/S2, no rubs, no gallops Respiratory: bibasilar rales.  Bibasilar wheeze Abdomen: Soft/+BS, non tender, non distended, no guarding Extremities: No edema, No lymphangitis,  No petechiae, No rashes, no synovitis   Data Reviewed: I have personally reviewed following labs and imaging studies Basic Metabolic Panel: Recent Labs  Lab 05/08/21 1516 05/08/21 1532 05/09/21 0539  NA  --  139 141  K  --  3.9 4.2  CL  --  105 106  CO2  --  25 27  GLUCOSE  --  129* 134*  BUN  --  17 15  CREATININE  --  0.96 0.69  CALCIUM  --  8.5* 8.6*  MG 1.7  --   --   PHOS 3.8  --   --    Liver Function Tests: No results for input(s): AST, ALT, ALKPHOS, BILITOT, PROT, ALBUMIN in the last 168 hours. No results for input(s): LIPASE, AMYLASE in the last 168 hours. No results for input(s): AMMONIA in the last 168 hours. Coagulation Profile: No results for input(s): INR, PROTIME in the last 168 hours. CBC: Recent Labs  Lab 05/08/21 1516 05/09/21 0539  WBC 15.9* 10.5  NEUTROABS 13.5*  --   HGB 10.5* 9.9*  HCT 31.9* 30.5*  MCV 90.1 88.7  PLT 296 265   Cardiac Enzymes: No results for input(s): CKTOTAL, CKMB, CKMBINDEX, TROPONINI in the last 168 hours. BNP: Invalid input(s): POCBNP CBG: No results for input(s): GLUCAP in the last 168 hours. HbA1C: No results for input(s): HGBA1C in the last 72 hours. Urine analysis:    Component Value Date/Time   COLORURINE YELLOW 05/02/2018 1630   APPEARANCEUR CLEAR 05/02/2018 1630   LABSPEC 1.006 05/02/2018 1630   PHURINE 7.0 05/02/2018 1630   GLUCOSEU NEGATIVE 05/02/2018 1630   HGBUR NEGATIVE 05/02/2018 1630   BILIRUBINUR NEGATIVE 05/02/2018 1630   KETONESUR NEGATIVE 05/02/2018 1630   PROTEINUR NEGATIVE 05/02/2018 1630   NITRITE NEGATIVE 05/02/2018 1630   LEUKOCYTESUR TRACE (A) 05/02/2018 1630   Sepsis Labs: @LABRCNTIP (procalcitonin:4,lacticidven:4) ) Recent Results (from the past 240 hour(s))  Resp Panel by RT-PCR (Flu A&B, Covid) Nasopharyngeal Swab     Status: None   Collection Time: 05/08/21  3:10 PM   Specimen: Nasopharyngeal Swab; Nasopharyngeal(NP) swabs in vial transport medium  Result Value Ref Range Status    SARS Coronavirus 2 by RT PCR NEGATIVE NEGATIVE Final    Comment: (NOTE) SARS-CoV-2 target nucleic acids are NOT DETECTED.  The SARS-CoV-2 RNA is generally detectable in upper respiratory specimens during the acute phase of infection. The lowest concentration of SARS-CoV-2 viral copies this assay can detect is 138 copies/mL. A negative result does not preclude SARS-Cov-2 infection and should not be used as the sole basis for treatment or other patient management decisions. A negative result may occur with  improper specimen collection/handling, submission of specimen other than nasopharyngeal swab, presence of viral mutation(s) within the areas targeted by this assay, and inadequate number of viral copies(<138 copies/mL). A negative result must be combined with clinical observations, patient history, and epidemiological information. The expected result is Negative.  Fact Sheet for Patients:  EntrepreneurPulse.com.au  Fact Sheet for Healthcare Providers:  IncredibleEmployment.be  This test is no t yet approved or cleared by the [place] FDA and  has been authorized for detection and/or diagnosis of SARS-CoV-2 by FDA under an Emergency Use Authorization (EUA). This EUA will remain  in effect (meaning  this test can be used) for the duration of the COVID-19 declaration under Section 564(b)(1) of the Act, 21 U.S.C.section 360bbb-3(b)(1), unless the authorization is terminated  or revoked sooner.       Influenza A by PCR NEGATIVE NEGATIVE Final   Influenza B by PCR NEGATIVE NEGATIVE Final    Comment: (NOTE) The Xpert Xpress SARS-CoV-2/FLU/RSV plus assay is intended as an aid in the diagnosis of influenza from Nasopharyngeal swab specimens and should not be used as a sole basis for treatment. Nasal washings and aspirates are unacceptable for Xpert Xpress SARS-CoV-2/FLU/RSV testing.  Fact Sheet for  Patients: EntrepreneurPulse.com.au  Fact Sheet for Healthcare Providers: IncredibleEmployment.be  This test is not yet approved or cleared by the [place] FDA and has been authorized for detection and/or diagnosis of SARS-CoV-2 by FDA under an Emergency Use Authorization (EUA). This EUA will remain in effect (meaning this test can be used) for the duration of the COVID-19 declaration under Section 564(b)(1) of the Act, 21 U.S.C. section 360bbb-3(b)(1), unless the authorization is terminated or revoked.  Performed at [facility], [address], [place], [place] [address]   Culture, blood (routine x 2)     Status: None (Preliminary result)   Collection Time: 05/08/21  3:32 PM   Specimen: Left Antecubital; Blood  Result Value Ref Range Status   Specimen Description LEFT ANTECUBITAL  Final   Special Requests   Final    BOTTLES DRAWN AEROBIC AND ANAEROBIC Blood Culture adequate volume   Culture   Final    NO GROWTH 2 DAYS Performed at [facility], [address], [place],  [address]    Report Status PENDING  Incomplete  Culture, blood (routine x 2)     Status: None (Preliminary result)   Collection Time: 05/08/21  4:06 PM   Specimen: Right Antecubital; Blood  Result Value Ref Range Status   Specimen Description RIGHT ANTECUBITAL  Final   Special Requests   Final    BOTTLES DRAWN AEROBIC AND ANAEROBIC Blood Culture adequate volume   Culture   Final    NO GROWTH 2 DAYS Performed at [facility], [address] [place],  [address]    Report Status PENDING  Incomplete     Scheduled Meds:  arformoterol  15 mcg Nebulization BID   atorvastatin  80 mg Oral Daily   budesonide (PULMICORT) nebulizer solution  0.5 mg Nebulization BID   carvedilol  12.5 mg Oral BID   clopidogrel  75 mg Oral Daily   dextromethorphan-guaiFENesin  1 tablet Oral BID   enoxaparin (LOVENOX) injection  40 mg Subcutaneous Q24H   fluticasone  1 spray Each  Nare Daily   furosemide  40 mg Oral Daily   gabapentin  600 mg Oral BID   ipratropium-albuterol  3 mL Nebulization TID   levothyroxine  75 mcg Oral Daily   losartan  100 mg Oral Daily   nicotine  21 mg Transdermal Daily   oxybutynin  5 mg Oral Daily   oxyCODONE  15 mg Oral BID   pantoprazole  40 mg Oral Daily   potassium chloride SA  20 mEq Oral Daily   predniSONE  40 mg Oral Q breakfast   Continuous Infusions:  levofloxacin (LEVAQUIN) IV 750 mg (05/09/21 1647)    Procedures/Studies: DG Chest 2 View  Result Date: 05/08/2021 CLINICAL DATA:  Shortness of breath.  Pneumonia. EXAM: CHEST - 2 VIEW COMPARISON:  05/23/2020 FINDINGS: Patchy airspace disease identified left mid and lower lung and right base.  Interstitial markings are diffusely coarsened with chronic features. Cardiopericardial silhouette is at upper limits of normal for size. The visualized bony structures of the thorax show no acute abnormality. Telemetry leads overlie the chest. IMPRESSION: Patchy bilateral airspace disease compatible with multifocal pneumonia. Electronically Signed   By: Misty Stanley M.D.   On: 05/08/2021 15:58    Orson Eva, DO  [facility]  If 7PM-7AM, please contact night-coverage [email or web address] Password [email or web address] 05/10/2021, 4:21 PM   LOS: 2 days

## 2021-05-10 NOTE — Plan of Care (Signed)

## 2021-05-11 ENCOUNTER — Inpatient Hospital Stay (HOSPITAL_COMMUNITY): Payer: Medicare Other

## 2021-05-11 DIAGNOSIS — I5032 Chronic diastolic (congestive) heart failure: Secondary | ICD-10-CM

## 2021-05-11 LAB — ECHOCARDIOGRAM COMPLETE
AR max vel: 2.13 cm2
AV Area VTI: 2.17 cm2
AV Area mean vel: 1.83 cm2
AV Mean grad: 3 mmHg
AV Peak grad: 6.6 mmHg
Ao pk vel: 1.28 m/s
Area-P 1/2: 2.04 cm2
Calc EF: 51.7 %
Height: 63 in
MV VTI: 2.09 cm2
S' Lateral: 3.1 cm
Single Plane A2C EF: 44.3 %
Single Plane A4C EF: 54.3 %
Weight: 2574.97 oz

## 2021-05-11 LAB — BASIC METABOLIC PANEL
Anion gap: 11 (ref 5–15)
BUN: 20 mg/dL (ref 8–23)
CO2: 33 mmol/L — ABNORMAL HIGH (ref 22–32)
Calcium: 8 mg/dL — ABNORMAL LOW (ref 8.9–10.3)
Chloride: 94 mmol/L — ABNORMAL LOW (ref 98–111)
Creatinine, Ser: 0.86 mg/dL (ref 0.44–1.00)
GFR, Estimated: >60 mL/min (ref >60)
Glucose, Bld: 123 mg/dL — ABNORMAL HIGH (ref 70–99)
Potassium: 3.7 mmol/L (ref 3.5–5.1)
Sodium: 138 mmol/L (ref 135–145)

## 2021-05-11 LAB — CBC
HCT: 37.6 % (ref 36.0–46.0)
Hemoglobin: 12.5 g/dL (ref 12.0–15.0)
MCH: 28.7 pg (ref 26.0–34.0)
MCHC: 33.2 g/dL (ref 30.0–36.0)
MCV: 86.2 fL (ref 80.0–100.0)
Platelets: 339 10*3/uL (ref 150–400)
RBC: 4.36 MIL/uL (ref 3.87–5.11)
RDW: 13.2 % (ref 11.5–15.5)
WBC: 10.5 10*3/uL (ref 4.0–10.5)
nRBC: 0 % (ref 0.0–0.2)

## 2021-05-11 LAB — LEGIONELLA PNEUMOPHILA SEROGP 1 UR AG: L. pneumophila Serogp 1 Ur Ag: NEGATIVE

## 2021-05-11 LAB — MAGNESIUM: Magnesium: 1.1 mg/dL — ABNORMAL LOW (ref 1.7–2.4)

## 2021-05-11 MED ORDER — LEVOFLOXACIN 750 MG PO TABS
750.0000 mg | ORAL_TABLET | Freq: Every day | ORAL | Status: DC
  Administered 2021-05-11: 13:00:00 750 mg via ORAL
  Filled 2021-05-11: qty 1

## 2021-05-11 MED ORDER — PREDNISONE 10 MG PO TABS: 60.0000 mg | ORAL_TABLET | Freq: Every day | ORAL | 0 refills | Status: DC

## 2021-05-11 MED ORDER — LEVOFLOXACIN 750 MG PO TABS: 750.0000 mg | ORAL_TABLET | Freq: Every day | ORAL | 0 refills | Status: DC

## 2021-05-11 MED ORDER — SODIUM CHLORIDE 0.9 % IV SOLN: INTRAVENOUS | Status: DC | PRN

## 2021-05-11 MED ORDER — MAGNESIUM SULFATE 2 GM/50ML IV SOLN
2.0000 g | Freq: Once | INTRAVENOUS | Status: AC
  Administered 2021-05-11: 12:00:00 2 g via INTRAVENOUS
  Filled 2021-05-11: qty 50

## 2021-05-11 NOTE — Discharge Summary (Addendum)
Physician Discharge Summary  Jessica Bell:[id number] DOB: 1960/05/27 DOA: 05/08/2021  PCP: Danna Hefty, DO  Admit date: 05/08/2021 Discharge date: 05/11/2021  Admitted From: Home Disposition:  Home   Recommendations for Outpatient Follow-up:  Follow up with PCP in 1-2 weeks Please obtain BMP/CBC in one week    Equipment/Devices: 2 L [place] Discharge Condition: Stable CODE STATUS:FULL Diet recommendation: Heart Healthy   Brief/Interim Summary: Jessica Bell is a 60 y.o. female with medical history significant of asthma, tobacco abuse, anxiety, HTN, HLD, hypothyroidism, chronic diastolic HF and hx of MI (no stents); who presented to ED with complaints of SOB, dyspnea on exertion, increase LE swelling, fever and productive coughing spells. Symptoms has been present since 12/22 and despite outpatient tx provided by her PCP symptoms has continue worsening becoming more intense. Patient reported decrease appetite and also chills.   Partially vaccinated against Covid (one dose of Moderna vaccine only), no boosted.  COVID/influenza PCR in the ED has been done and negative.   ED Course: Chest x-ray demonstrating multifocal pneumonia, patient with elevated WBCs and acute respiratory failure with hypoxia (requiring 4 L nasal cannula supplementation.  Stable electrolytes and renal function.  Cultures taken and antibiotic has been started.  TRH has been contacted to place in the hospital for further evaluation and management of sepsis secondary to pneumonia.  Sepsis criteria has been met at time of admission with reports of fever at home temperature up to 101.2, elevated WBCs, elevated respiratory rate and pulse rate source of infection with infiltrates seen on x-ray.   Discharge Diagnoses:  acute respiratory failure with hypoxia in the setting of multifocal pneumonia and COPD exacerbation -Afebrile, with improvement in her WBCs and expressing less difficulty breathing. -Continue current  IV antibiotics -Continue bronchodilator management and the use of steroids.  Patient with underlying history of COPD. -Will benefit of PFTs as an outpatient -Continued Brovana and Pulmicort and IV steroids -d/c home with 3 more days levofloxacin and prednisone taper   chronic diastolic heart failure -appear clinically euvolemic -No crackles appreciated on examination. -Trace edema only seen on today's exam. -Continue po furosemide after d/c -Follow daily weights, low-sodium diet and strict intake and output. -12/29 Echo--EF 50-55%, no WMA, G1DD   hypertension -Continue coreg and losartan -follow heart healthy diet.   hyperlipidemia -Continue statins.   hypothyroidism -Continue Synthroid   tobacco abuse -Cessation counseling has been provided -Nicotine patch has been ordered.   GERD -Continue PPI   chronic pain syndrome -Continue home dose of OxyContin as needed oxycodone for breakthrough. -PDMP reviewed--she receives monthly OxyContin, oxy IR, clonazepam   CAD/prior history of MI -Currently denying any chest pain -Continue the use of Plavix, carvedilol and Lipitor.   Augmentin Intolerance -causes diarrhea -tolerates cephalosporins    Discharge Instructions   Allergies as of 05/11/2021       Reactions   Amoxicillin-pot Clavulanate Anaphylaxis, Other (See Comments)   N/V/D   Seasonal Ic [cholestatin]    Per pt not allergic to medication-has seasonal allergies        Medication List     STOP taking these medications    cefdinir 300 MG capsule Commonly known as: OMNICEF   hydrochlorothiazide 25 MG tablet Commonly known as: HYDRODIURIL       TAKE these medications    albuterol 108 (90 Base) MCG/ACT inhaler Commonly known as: VENTOLIN HFA Inhale into the lungs.   amLODipine 5 MG tablet Commonly known as: NORVASC Take 5 mg by mouth daily.  atorvastatin 80 MG tablet Commonly known as: LIPITOR Take 80 mg by mouth daily.   baclofen 10 MG  tablet Commonly known as: LIORESAL Take 10 mg by mouth 3 (three) times daily as needed.   budesonide-formoterol 160-4.5 MCG/ACT inhaler Commonly known as: Symbicort Inhale 1 puff into the lungs in the morning and at bedtime.   carvedilol 12.5 MG tablet Commonly known as: COREG Take 12.5 mg by mouth 2 (two) times daily.   clonazePAM 0.5 MG tablet Commonly known as: KLONOPIN Take 0.5 mg by mouth daily.   clopidogrel 75 MG tablet Commonly known as: PLAVIX Take 1 tablet (75 mg total) by mouth daily.   dicyclomine 10 MG capsule Commonly known as: BENTYL Take 10 mg by mouth 3 (three) times daily.   fluticasone 50 MCG/ACT nasal spray Commonly known as: FLONASE Place 1 spray into both nostrils daily.   furosemide 20 MG tablet Commonly known as: LASIX Take 20 mg by mouth daily.   gabapentin 300 MG capsule Commonly known as: NEURONTIN Take 2 capsules by mouth 2 (two) times daily.   levofloxacin 750 MG tablet Commonly known as: LEVAQUIN Take 1 tablet (750 mg total) by mouth daily. Start taking on: May 12, 2021   levothyroxine 75 MCG tablet Commonly known as: SYNTHROID Take 75 mcg by mouth daily.   losartan 100 MG tablet Commonly known as: COZAAR Take 1 tablet by mouth daily.   nitroGLYCERIN 0.4 MG SL tablet Commonly known as: NITROSTAT See admin instructions.   omeprazole 20 MG capsule Commonly known as: PRILOSEC Take 1 capsule by mouth 2 (two) times daily.   ondansetron 8 MG disintegrating tablet Commonly known as: ZOFRAN-ODT DISSOLVE 1 TABLET(8 MG) ON THE TONGUE EVERY 6 HOURS AS NEEDED FOR NAUSEA OR VOMITING   oxybutynin 5 MG 24 hr tablet Commonly known as: DITROPAN-XL Take 5 mg by mouth daily.   Oxycodone HCl 10 MG Tabs Take 10 mg by mouth 3 (three) times daily.   OxyCONTIN 15 mg 12 hr tablet Generic drug: oxyCODONE Take 15 mg by mouth 2 (two) times daily.   pantoprazole 40 MG tablet Commonly known as: PROTONIX Take 1 tablet (40 mg total) by  mouth daily. Office visit for further refills   potassium chloride SA 20 MEQ tablet Commonly known as: KLOR-CON M Take 1 tablet (20 mEq total) by mouth daily.   predniSONE 10 MG tablet Commonly known as: DELTASONE Take 6 tablets (60 mg total) by mouth daily with breakfast. And decrease by one tablet daily Start taking on: May 12, 2021 What changed:  medication strength how much to take when to take this additional instructions               Durable Medical Equipment  (From admission, onward)           Start     Ordered   05/11/21 1107  For home use only DME oxygen  Once       Question Answer Comment  Length of Need 6 Months   Mode or (Route) Nasal cannula   Liters per Minute 2   Frequency Continuous (stationary and portable oxygen unit needed)   Oxygen conserving device Yes   Oxygen delivery system Gas      05/11/21 1106            Follow-up Information     [facility] Follow up.   Why: [facility] Contact information: [place] [address] [phone]  Allergies  Allergen Reactions   Amoxicillin-Pot Clavulanate Anaphylaxis and Other (See Comments)    N/V/D   Seasonal Ic [Cholestatin]     Per pt not allergic to medication-has seasonal allergies    Consultations: none   Procedures/Studies: DG Chest 2 View  Result Date: 05/08/2021 CLINICAL DATA:  Shortness of breath.  Pneumonia. EXAM: CHEST - 2 VIEW COMPARISON:  05/23/2020 FINDINGS: Patchy airspace disease identified left mid and lower lung and right base. Interstitial markings are diffusely coarsened with chronic features. Cardiopericardial silhouette is at upper limits of normal for size. The visualized bony structures of the thorax show no acute abnormality. Telemetry leads overlie the chest. IMPRESSION: Patchy bilateral airspace disease compatible with multifocal pneumonia. Electronically Signed   By: Misty Stanley M.D.   On: 05/08/2021  15:58   ECHOCARDIOGRAM COMPLETE  Result Date: 05/11/2021    ECHOCARDIOGRAM REPORT   Patient Name:   Jessica Bell Date of Exam: 05/11/2021 Medical Rec #:  [id number]     Height:       63.0 in Accession #:    [id number]    Weight:       160.9 lb Date of Birth:  1960/05/20     BSA:          1.763 m Patient Age:    60 years      BP:           173/82 mmHg Patient Gender: F             HR:           61 bpm. Exam Location:  [facility] Procedure: 2D Echo, Cardiac Doppler and Color Doppler Indications:    CHF  History:        Patient has no prior history of Echocardiogram examinations. CAD                 and Previous Myocardial Infarction, COPD; Risk                 Factors:Hypertension and Dyslipidemia.  Sonographer:    Wenda Low Referring Phys: [id number] Timothey Dahlstrom IMPRESSIONS  1. Distal septal hypokinesis . Left ventricular ejection fraction, by estimation, is 50 to 55%. The left ventricle has low normal function. The left ventricle has no regional wall motion abnormalities. There is mild left ventricular hypertrophy. Left ventricular diastolic parameters are consistent with Grade I diastolic dysfunction (impaired relaxation).  2. Right ventricular systolic function is normal. The right ventricular size is normal. There is normal pulmonary artery systolic pressure.  3. The mitral valve is normal in structure. No evidence of mitral valve regurgitation. No evidence of mitral stenosis.  4. The aortic valve is tricuspid. Aortic valve regurgitation is not visualized. No aortic stenosis is present.  5. The inferior vena cava is normal in size with greater than 50% respiratory variability, suggesting right atrial pressure of 3 mmHg. FINDINGS  Left Ventricle: Distal septal hypokinesis. Left ventricular ejection fraction, by estimation, is 50 to 55%. The left ventricle has low normal function. The left ventricle has no regional wall motion abnormalities. The left ventricular internal cavity size was normal in size. There is  mild left ventricular hypertrophy. Left ventricular diastolic parameters are consistent with Grade I diastolic dysfunction (impaired relaxation). Right Ventricle: The right ventricular size is normal. No increase in right ventricular wall thickness. Right ventricular systolic function is normal. There is normal pulmonary artery systolic pressure. The tricuspid regurgitant velocity is 2.56 m/s, and  with an assumed right  atrial pressure of 3 mmHg, the estimated right ventricular systolic pressure is 17.7 mmHg. Left Atrium: Left atrial size was normal in size. Right Atrium: Right atrial size was normal in size. Pericardium: There is no evidence of pericardial effusion. Mitral Valve: The mitral valve is normal in structure. No evidence of mitral valve regurgitation. No evidence of mitral valve stenosis. MV peak gradient, 3.7 mmHg. The mean mitral valve gradient is 1.0 mmHg. Tricuspid Valve: The tricuspid valve is normal in structure. Tricuspid valve regurgitation is not demonstrated. No evidence of tricuspid stenosis. Aortic Valve: The aortic valve is tricuspid. Aortic valve regurgitation is not visualized. No aortic stenosis is present. Aortic valve mean gradient measures 3.0 mmHg. Aortic valve peak gradient measures 6.6 mmHg. Aortic valve area, by VTI measures 2.17 cm. Pulmonic Valve: The pulmonic valve was normal in structure. Pulmonic valve regurgitation is not visualized. No evidence of pulmonic stenosis. Aorta: The aortic root is normal in size and structure. Venous: The inferior vena cava is normal in size with greater than 50% respiratory variability, suggesting right atrial pressure of 3 mmHg. IAS/Shunts: No atrial level shunt detected by color flow Doppler.  LEFT VENTRICLE PLAX 2D LVIDd:         4.20 cm     Diastology LVIDs:         3.10 cm     LV e' medial:    6.85 cm/s LV PW:         1.00 cm     LV E/e' medial:  7.1 LV IVS:        1.20 cm     LV e' lateral:   7.29 cm/s LVOT diam:     1.90 cm     LV E/e'  lateral: 6.6 LV SV:         63 LV SV Index:   36 LVOT Area:     2.84 cm  LV Volumes (MOD) LV vol d, MOD A2C: 58.3 ml LV vol d, MOD A4C: 54.3 ml LV vol s, MOD A2C: 32.5 ml LV vol s, MOD A4C: 24.8 ml LV SV MOD A2C:     25.8 ml LV SV MOD A4C:     54.3 ml LV SV MOD BP:      30.2 ml RIGHT VENTRICLE RV Basal diam:  3.10 cm RV Mid diam:    2.40 cm RV S prime:     18.20 cm/s TAPSE (M-mode): 2.0 cm LEFT ATRIUM             Index        RIGHT ATRIUM           Index LA diam:        3.60 cm 2.04 cm/m   RA Area:     13.70 cm LA Vol (A2C):   48.0 ml 27.23 ml/m  RA Volume:   30.70 ml  17.41 ml/m LA Vol (A4C):   37.3 ml 21.16 ml/m LA Biplane Vol: 45.8 ml 25.98 ml/m  AORTIC VALVE                    PULMONIC VALVE AV Area (Vmax):    2.13 cm     PV Vmax:       0.84 m/s AV Area (Vmean):   1.83 cm     PV Peak grad:  2.8 mmHg AV Area (VTI):     2.17 cm AV Vmax:           128.00 cm/s AV Vmean:  87.150 cm/s AV VTI:            0.290 m AV Peak Grad:      6.6 mmHg AV Mean Grad:      3.0 mmHg LVOT Vmax:         96.00 cm/s LVOT Vmean:        56.300 cm/s LVOT VTI:          0.222 m LVOT/AV VTI ratio: 0.77  AORTA Ao Root diam: 2.50 cm Ao Asc diam:  3.30 cm MITRAL VALVE               TRICUSPID VALVE MV Area (PHT): 2.04 cm    TR Peak grad:   26.2 mmHg MV Area VTI:   2.09 cm    TR Vmax:        256.00 cm/s MV Peak grad:  3.7 mmHg MV Mean grad:  1.0 mmHg    SHUNTS MV Vmax:       0.97 m/s    Systemic VTI:  0.22 m MV Vmean:      51.0 cm/s   Systemic Diam: 1.90 cm MV Decel Time: 372 msec MV E velocity: 48.40 cm/s MV A velocity: 82.00 cm/s MV E/A ratio:  0.59 Jenkins Rouge MD Electronically signed by Jenkins Rouge MD Signature Date/Time: 05/11/2021/12:17:45 PM    Final         Discharge Exam: Vitals:   05/11/21 0557 05/11/21 0829  BP: (!) 173/82   Pulse: 61   Resp: 18   Temp: 97.7 F (36.5 C)   SpO2: 98% 96%   Vitals:   05/10/21 2241 05/11/21 0400 05/11/21 0557 05/11/21 0829  BP: 129/78  (!) 173/82   Pulse: 65  61    Resp: 20  18   Temp: 98.1 F (36.7 C)  97.7 F (36.5 C)   TempSrc: Oral  Oral   SpO2: 100%  98% 96%  Weight:  73 kg    Height:        General: Pt is alert, awake, not in acute distress Cardiovascular: RRR, S1/S2 +, no rubs, no gallops Respiratory: bibasilar rales. No wheeze Abdominal: Soft, NT, ND, bowel sounds + Extremities: no edema, no cyanosis   The results of significant diagnostics from this hospitalization (including imaging, microbiology, ancillary and laboratory) are listed below for reference.    Significant Diagnostic Studies: DG Chest 2 View  Result Date: 05/08/2021 CLINICAL DATA:  Shortness of breath.  Pneumonia. EXAM: CHEST - 2 VIEW COMPARISON:  05/23/2020 FINDINGS: Patchy airspace disease identified left mid and lower lung and right base. Interstitial markings are diffusely coarsened with chronic features. Cardiopericardial silhouette is at upper limits of normal for size. The visualized bony structures of the thorax show no acute abnormality. Telemetry leads overlie the chest. IMPRESSION: Patchy bilateral airspace disease compatible with multifocal pneumonia. Electronically Signed   By: Misty Stanley M.D.   On: 05/08/2021 15:58   ECHOCARDIOGRAM COMPLETE  Result Date: 05/11/2021    ECHOCARDIOGRAM REPORT   Patient Name:   Jessica Bell Date of Exam: 05/11/2021 Medical Rec #:  [id number]     Height:       63.0 in Accession #:    [id number]    Weight:       160.9 lb Date of Birth:  1960-08-29     BSA:          1.763 m Patient Age:    70 years      BP:  173/82 mmHg Patient Gender: F             HR:           61 bpm. Exam Location:  [facility] Procedure: 2D Echo, Cardiac Doppler and Color Doppler Indications:    CHF  History:        Patient has no prior history of Echocardiogram examinations. CAD                 and Previous Myocardial Infarction, COPD; Risk                 Factors:Hypertension and Dyslipidemia.  Sonographer:    Wenda Low Referring Phys: [phone]  Magie Ciampa IMPRESSIONS  1. Distal septal hypokinesis . Left ventricular ejection fraction, by estimation, is 50 to 55%. The left ventricle has low normal function. The left ventricle has no regional wall motion abnormalities. There is mild left ventricular hypertrophy. Left ventricular diastolic parameters are consistent with Grade I diastolic dysfunction (impaired relaxation).  2. Right ventricular systolic function is normal. The right ventricular size is normal. There is normal pulmonary artery systolic pressure.  3. The mitral valve is normal in structure. No evidence of mitral valve regurgitation. No evidence of mitral stenosis.  4. The aortic valve is tricuspid. Aortic valve regurgitation is not visualized. No aortic stenosis is present.  5. The inferior vena cava is normal in size with greater than 50% respiratory variability, suggesting right atrial pressure of 3 mmHg. FINDINGS  Left Ventricle: Distal septal hypokinesis. Left ventricular ejection fraction, by estimation, is 50 to 55%. The left ventricle has low normal function. The left ventricle has no regional wall motion abnormalities. The left ventricular internal cavity size was normal in size. There is mild left ventricular hypertrophy. Left ventricular diastolic parameters are consistent with Grade I diastolic dysfunction (impaired relaxation). Right Ventricle: The right ventricular size is normal. No increase in right ventricular wall thickness. Right ventricular systolic function is normal. There is normal pulmonary artery systolic pressure. The tricuspid regurgitant velocity is 2.56 m/s, and  with an assumed right atrial pressure of 3 mmHg, the estimated right ventricular systolic pressure is 44.9 mmHg. Left Atrium: Left atrial size was normal in size. Right Atrium: Right atrial size was normal in size. Pericardium: There is no evidence of pericardial effusion. Mitral Valve: The mitral valve is normal in structure. No evidence of mitral valve  regurgitation. No evidence of mitral valve stenosis. MV peak gradient, 3.7 mmHg. The mean mitral valve gradient is 1.0 mmHg. Tricuspid Valve: The tricuspid valve is normal in structure. Tricuspid valve regurgitation is not demonstrated. No evidence of tricuspid stenosis. Aortic Valve: The aortic valve is tricuspid. Aortic valve regurgitation is not visualized. No aortic stenosis is present. Aortic valve mean gradient measures 3.0 mmHg. Aortic valve peak gradient measures 6.6 mmHg. Aortic valve area, by VTI measures 2.17 cm. Pulmonic Valve: The pulmonic valve was normal in structure. Pulmonic valve regurgitation is not visualized. No evidence of pulmonic stenosis. Aorta: The aortic root is normal in size and structure. Venous: The inferior vena cava is normal in size with greater than 50% respiratory variability, suggesting right atrial pressure of 3 mmHg. IAS/Shunts: No atrial level shunt detected by color flow Doppler.  LEFT VENTRICLE PLAX 2D LVIDd:         4.20 cm     Diastology LVIDs:         3.10 cm     LV e' medial:    6.85 cm/s LV PW:  1.00 cm     LV E/e' medial:  7.1 LV IVS:        1.20 cm     LV e' lateral:   7.29 cm/s LVOT diam:     1.90 cm     LV E/e' lateral: 6.6 LV SV:         63 LV SV Index:   36 LVOT Area:     2.84 cm  LV Volumes (MOD) LV vol d, MOD A2C: 58.3 ml LV vol d, MOD A4C: 54.3 ml LV vol s, MOD A2C: 32.5 ml LV vol s, MOD A4C: 24.8 ml LV SV MOD A2C:     25.8 ml LV SV MOD A4C:     54.3 ml LV SV MOD BP:      30.2 ml RIGHT VENTRICLE RV Basal diam:  3.10 cm RV Mid diam:    2.40 cm RV S prime:     18.20 cm/s TAPSE (M-mode): 2.0 cm LEFT ATRIUM             Index        RIGHT ATRIUM           Index LA diam:        3.60 cm 2.04 cm/m   RA Area:     13.70 cm LA Vol (A2C):   48.0 ml 27.23 ml/m  RA Volume:   30.70 ml  17.41 ml/m LA Vol (A4C):   37.3 ml 21.16 ml/m LA Biplane Vol: 45.8 ml 25.98 ml/m  AORTIC VALVE                    PULMONIC VALVE AV Area (Vmax):    2.13 cm     PV Vmax:        0.84 m/s AV Area (Vmean):   1.83 cm     PV Peak grad:  2.8 mmHg AV Area (VTI):     2.17 cm AV Vmax:           128.00 cm/s AV Vmean:          87.150 cm/s AV VTI:            0.290 m AV Peak Grad:      6.6 mmHg AV Mean Grad:      3.0 mmHg LVOT Vmax:         96.00 cm/s LVOT Vmean:        56.300 cm/s LVOT VTI:          0.222 m LVOT/AV VTI ratio: 0.77  AORTA Ao Root diam: 2.50 cm Ao Asc diam:  3.30 cm MITRAL VALVE               TRICUSPID VALVE MV Area (PHT): 2.04 cm    TR Peak grad:   26.2 mmHg MV Area VTI:   2.09 cm    TR Vmax:        256.00 cm/s MV Peak grad:  3.7 mmHg MV Mean grad:  1.0 mmHg    SHUNTS MV Vmax:       0.97 m/s    Systemic VTI:  0.22 m MV Vmean:      51.0 cm/s   Systemic Diam: 1.90 cm MV Decel Time: 372 msec MV E velocity: 48.40 cm/s MV A velocity: 82.00 cm/s MV E/A ratio:  0.59 Jenkins Rouge MD Electronically signed by Jenkins Rouge MD Signature Date/Time: 05/11/2021/12:17:45 PM    Final     Microbiology: Recent Results (from the past 240 hour(s))  Resp Panel by RT-PCR (Flu A&B, Covid) Nasopharyngeal Swab     Status: None   Collection Time: 05/08/21  3:10 PM   Specimen: Nasopharyngeal Swab; Nasopharyngeal(NP) swabs in vial transport medium  Result Value Ref Range Status   SARS Coronavirus 2 by RT PCR NEGATIVE NEGATIVE Final    Comment: (NOTE) SARS-CoV-2 target nucleic acids are NOT DETECTED.  The SARS-CoV-2 RNA is generally detectable in upper respiratory specimens during the acute phase of infection. The lowest concentration of SARS-CoV-2 viral copies this assay can detect is 138 copies/mL. A negative result does not preclude SARS-Cov-2 infection and should not be used as the sole basis for treatment or other patient management decisions. A negative result may occur with  improper specimen collection/handling, submission of specimen other than nasopharyngeal swab, presence of viral mutation(s) within the areas targeted by this assay, and inadequate number of viral copies(<138  copies/mL). A negative result must be combined with clinical observations, patient history, and epidemiological information. The expected result is Negative.  Fact Sheet for Patients:  EntrepreneurPulse.com.au  Fact Sheet for Healthcare Providers:  IncredibleEmployment.be  This test is no t yet approved or cleared by the [place] FDA and  has been authorized for detection and/or diagnosis of SARS-CoV-2 by FDA under an Emergency Use Authorization (EUA). This EUA will remain  in effect (meaning this test can be used) for the duration of the COVID-19 declaration under Section 564(b)(1) of the Act, 21 U.S.C.section 360bbb-3(b)(1), unless the authorization is terminated  or revoked sooner.       Influenza A by PCR NEGATIVE NEGATIVE Final   Influenza B by PCR NEGATIVE NEGATIVE Final    Comment: (NOTE) The Xpert Xpress SARS-CoV-2/FLU/RSV plus assay is intended as an aid in the diagnosis of influenza from Nasopharyngeal swab specimens and should not be used as a sole basis for treatment. Nasal washings and aspirates are unacceptable for Xpert Xpress SARS-CoV-2/FLU/RSV testing.  Fact Sheet for Patients: EntrepreneurPulse.com.au  Fact Sheet for Healthcare Providers: IncredibleEmployment.be  This test is not yet approved or cleared by the [place] FDA and has been authorized for detection and/or diagnosis of SARS-CoV-2 by FDA under an Emergency Use Authorization (EUA). This EUA will remain in effect (meaning this test can be used) for the duration of the COVID-19 declaration under Section 564(b)(1) of the Act, 21 U.S.C. section 360bbb-3(b)(1), unless the authorization is terminated or revoked.  Performed at [facility], [address] [place], [place] [address]   Culture, blood (routine x 2)     Status: None (Preliminary result)   Collection Time: 05/08/21  3:32 PM   Specimen: Left Antecubital; Blood   Result Value Ref Range Status   Specimen Description LEFT ANTECUBITAL  Final   Special Requests   Final    BOTTLES DRAWN AEROBIC AND ANAEROBIC Blood Culture adequate volume   Culture   Final    NO GROWTH 2 DAYS Performed at [facility], [address], [place], [place] [address]    Report Status PENDING  Incomplete  Culture, blood (routine x 2)     Status: None (Preliminary result)   Collection Time: 05/08/21  4:06 PM   Specimen: Right Antecubital; Blood  Result Value Ref Range Status   Specimen Description RIGHT ANTECUBITAL  Final   Special Requests   Final    BOTTLES DRAWN AEROBIC AND ANAEROBIC Blood Culture adequate volume   Culture   Final    NO GROWTH 2 DAYS Performed at [facility], [address], [place],  [place] [address]    Report Status PENDING  Incomplete     Labs: Basic Metabolic Panel: Recent Labs  Lab 05/08/21 1516 05/08/21 1532 05/08/21 1532 05/09/21 0539 05/11/21 0615  NA  --  139  --  141 138  K  --  3.9   < > 4.2 3.7  CL  --  105  --  106 94*  CO2  --  25  --  27 33*  GLUCOSE  --  129*  --  134* 123*  BUN  --  17  --  15 20  CREATININE  --  0.96  --  0.69 0.86  CALCIUM  --  8.5*  --  8.6* 8.0*  MG 1.7  --   --   --  1.1*  PHOS 3.8  --   --   --   --    < > = values in this interval not displayed.   Liver Function Tests: No results for input(s): AST, ALT, ALKPHOS, BILITOT, PROT, ALBUMIN in the last 168 hours. No results for input(s): LIPASE, AMYLASE in the last 168 hours. No results for input(s): AMMONIA in the last 168 hours. CBC: Recent Labs  Lab 05/08/21 1516 05/09/21 0539 05/11/21 0615  WBC 15.9* 10.5 10.5  NEUTROABS 13.5*  --   --   HGB 10.5* 9.9* 12.5  HCT 31.9* 30.5* 37.6  MCV 90.1 88.7 86.2  PLT 296 265 339   Cardiac Enzymes: No results for input(s): CKTOTAL, CKMB, CKMBINDEX, TROPONINI in the last 168 hours. BNP: Invalid input(s): POCBNP CBG: No results for input(s): GLUCAP in the last 168 hours.  Time coordinating  discharge:  36 minutes  Signed:  Orson Eva, DO [facility] Pager: [phone] 05/11/2021, 1:42 PM

## 2021-05-11 NOTE — Evaluation (Signed)
Physical Therapy Evaluation Patient Details Name: Jessica Bell MRN: [id number] DOB: 09-20-1960 Today's Date: 05/11/2021  History of Present Illness  Jessica Bell is a 60 y.o. female with medical history significant of asthma, tobacco abuse, anxiety, HTN, HLD, hypothyroidism, chronic diastolic HF and hx of MI (no stents); who presented to ED with complaints of SOB, dyspnea on exertion, increase LE swelling, fever and productive coughing spells. Symptoms has been present since 12/22 and despite outpatient tx provided by her PCP symptoms has continue worsening becoming more intense. Patient reported decrease appetite and also chills.     Partially vaccinated against Covid (one dose of Moderna vaccine only), no boosted.  COVID/influenza PCR in the ED has been done and negative.   Clinical Impression  Patient functioning at baseline for functional mobility and gait demonstrating good return for ambulation in room/hallway without loss of balance.  Patient on room air during ambulation with SpO2 dropping from 98% to 85%, put on 2 LPM O2 to maintain at 90-92% - nursing staff notified.  Plan:  Patient discharged from physical therapy to care of nursing for ambulation daily as tolerated for length of stay.         Recommendations for follow up therapy are one component of a multi-disciplinary discharge planning process, led by the attending physician.  Recommendations may be updated based on patient status, additional functional criteria and insurance authorization.  Follow Up Recommendations No PT follow up    Assistance Recommended at Discharge PRN  Functional Status Assessment Patient has not had a recent decline in their functional status  Equipment Recommendations  None recommended by PT    Recommendations for Other Services       Precautions / Restrictions Precautions Precautions: None Restrictions Weight Bearing Restrictions: No      Mobility  Bed Mobility Overal bed mobility:  Modified Independent                  Transfers Overall transfer level: Modified independent                      Ambulation/Gait Ambulation/Gait assistance: Modified independent (Device/Increase time) Gait Distance (Feet): 75 Feet Assistive device: Rolling walker (2 wheels) Gait Pattern/deviations: Decreased step length - right;Decreased step length - left;Decreased stride length Gait velocity: decreased     General Gait Details: slightly labored cadence with good return for ambulation in room and hallway without loss of balance  Stairs            Wheelchair Mobility    Modified Rankin (Stroke Patients Only)       Balance Overall balance assessment: Needs assistance Sitting-balance support: Feet supported;No upper extremity supported Sitting balance-Leahy Scale: Good Sitting balance - Comments: seated at EOB   Standing balance support: During functional activity;No upper extremity supported Standing balance-Leahy Scale: Fair Standing balance comment: fair/good using RW                             Pertinent Vitals/Pain Pain Assessment: 0-10 Pain Score: 6  Pain Location: chronic low back pain Pain Descriptors / Indicators: Sore;Aching Pain Intervention(s): Limited activity within patient's tolerance;Monitored during session;Repositioned    Home Living Family/patient expects to be discharged to:: Private residence Living Arrangements: Spouse/significant other Available Help at Discharge: Family;Available 24 hours/day Type of Home: House Home Access: Stairs to enter Entrance Stairs-Rails: Right Entrance Stairs-Number of Steps: 3   Home Layout: One level Home Equipment: Rolling  Walker (2 wheels);Standard Walker;Cane - single point;BSC/3in1;Shower seat      Prior Function Prior Level of Function : Independent/Modified Independent             Mobility Comments: household and short distanced community ambulator using RW  PRN ADLs Comments: Independent     Hand Dominance   Dominant Hand: Right    Extremity/Trunk Assessment   Upper Extremity Assessment Upper Extremity Assessment: Overall WFL for tasks assessed    Lower Extremity Assessment Lower Extremity Assessment: Overall WFL for tasks assessed    Cervical / Trunk Assessment Cervical / Trunk Assessment: Normal  Communication   Communication: No difficulties  Cognition Arousal/Alertness: Awake/alert Behavior During Therapy: WFL for tasks assessed/performed Overall Cognitive Status: Within Functional Limits for tasks assessed                                          General Comments      Exercises     Assessment/Plan    PT Assessment Patient does not need any further PT services  PT Problem List         PT Treatment Interventions      PT Goals (Current goals can be found in the Care Plan section)  Acute Rehab PT Goals Patient Stated Goal: return home with family to assist PT Goal Formulation: With patient Time For Goal Achievement: 05/11/21 Potential to Achieve Goals: Good    Frequency     Barriers to discharge        Co-evaluation               AM-PAC PT "6 Clicks" Mobility  Outcome Measure Help needed turning from your back to your side while in a flat bed without using bedrails?: None Help needed moving from lying on your back to sitting on the side of a flat bed without using bedrails?: None Help needed moving to and from a bed to a chair (including a wheelchair)?: None Help needed standing up from a chair using your arms (e.g., wheelchair or bedside chair)?: None Help needed to walk in hospital room?: None Help needed climbing 3-5 steps with a railing? : None 6 Click Score: 24    End of Session Equipment Utilized During Treatment: Oxygen Activity Tolerance: Patient tolerated treatment well;Patient limited by fatigue Patient left: in bed;with call bell/phone within reach Nurse  Communication: Mobility status PT Visit Diagnosis: Unsteadiness on feet (R26.81);Other abnormalities of gait and mobility (R26.89);Muscle weakness (generalized) (M62.81)    Time: 2919-1660 PT Time Calculation (min) (ACUTE ONLY): 25 min   Charges:   PT Evaluation $PT Eval Moderate Complexity: 1 Mod PT Treatments $Therapeutic Activity: 23-37 mins        12:22 PM, 05/11/21 Ocie Bob, MPT Physical Therapist with [facility] [phone] office [phone] mobile phone

## 2021-05-11 NOTE — Progress Notes (Signed)
*  PRELIMINARY RESULTS* Echocardiogram 2D Echocardiogram has been performed.  Carolyne Fiscal 05/11/2021, 11:39 AM

## 2021-05-11 NOTE — Progress Notes (Signed)
Discharge instructions reviewed with patient. Given AVS, prescriptions called in to her pharmacy by provider. Verbalized understanding of instructions. Received levaquin PO prior to discharge as instructed. No complaints at time of discharge. Home O2 delivered and sent with patient at discharge. IV site removed by nursing staff, within normal limits, patient left floor in stable condition via w/c accompanied by nurse techs. Discharged home.

## 2021-05-11 NOTE — TOC Transition Note (Signed)
Transition of Care [facility]) - CM/SW Discharge Note   Patient Details  Name: Jessica Bell MRN: [id number] Date of Birth: 1960-08-28  Transition of Care [facility]) CM/SW Contact:  Leitha Bleak, RN Phone Number: 05/11/2021, 1:42 PM   Clinical Narrative:   Patient discharging home today. Needs home oxygen. Note and orders are in, [place] with [employer] accepted the referral. RN updated.   Final next level of care: Home/Self Care Barriers to Discharge: Barriers Resolved   Patient Goals and CMS Choice Patient states their goals for this hospitalization and ongoing recovery are:: to go home. CMS Medicare.gov Compare Post Acute Care list provided to:: Patient Choice offered to / list presented to : Patient  Discharge Placement           Patient and family notified of of transfer: 05/11/21  Discharge Plan and Services               DME Arranged: Oxygen DME Agency: [employer] Date DME Agency Contacted: 05/11/21 Time DME Agency Contacted: 1100 Representative spoke with at DME Agency: Nilsa Nutting      Readmission Risk Interventions Readmission Risk Prevention Plan 05/11/2021  Transportation Screening Complete  HRI or Home Care Consult Complete  Social Work Consult for Recovery Care Planning/Counseling Complete  Palliative Care Screening Not Applicable  Medication Review [employer]) Complete  Some recent data might be hidden

## 2021-05-11 NOTE — Plan of Care (Signed)
°  Problem: Clinical Measurements: Goal: Will remain free from infection Outcome: Adequate for Discharge Goal: Diagnostic test results will improve Outcome: Adequate for Discharge Goal: Respiratory complications will improve Outcome: Adequate for Discharge   Problem: Pain Managment: Goal: General experience of comfort will improve Outcome: Adequate for Discharge

## 2021-05-11 NOTE — Progress Notes (Signed)
SATURATION QUALIFICATIONS: (This note is used to comply with regulatory documentation for home oxygen)  Patient Saturations on Room Air at Rest = 90%  Patient Saturations on Room Air while Ambulating = 85%  Patient Saturations on 2 Liters of oxygen while Ambulating = 94%  Please briefly explain why patient needs home oxygen: will need home O2 to maintain O2 sats during activity/ambulation.

## 2021-05-11 NOTE — Care Management Important Message (Signed)
Important Message  Patient Details  Name: Jessica Bell MRN: [id number] Date of Birth: 1961-04-03   Medicare Important Message Given:  N/A - LOS <3 / Initial given by admissions     Johnell Comings 05/11/2021, 12:05 PM

## 2021-05-12 LAB — URINE CULTURE: Culture: 30000 — AB

## 2021-05-13 LAB — CULTURE, BLOOD (ROUTINE X 2)
Culture: NO GROWTH
Culture: NO GROWTH
Special Requests: ADEQUATE
Special Requests: ADEQUATE

## 2021-06-09 ENCOUNTER — Encounter (HOSPITAL_COMMUNITY): Payer: Self-pay

## 2021-06-09 ENCOUNTER — Ambulatory Visit (HOSPITAL_COMMUNITY): Payer: Medicare Other

## 2021-06-30 ENCOUNTER — Other Ambulatory Visit: Payer: Self-pay | Admitting: Internal Medicine

## 2021-08-23 ENCOUNTER — Other Ambulatory Visit: Payer: Self-pay

## 2021-08-23 ENCOUNTER — Emergency Department (HOSPITAL_COMMUNITY): Payer: Medicare Other

## 2021-08-23 ENCOUNTER — Observation Stay (HOSPITAL_COMMUNITY)
Admission: EM | Admit: 2021-08-23 | Discharge: 2021-08-24 | Disposition: A | Discharge status: Home / Self Care | Payer: Medicare Other | Attending: Family Medicine | Admitting: Family Medicine

## 2021-08-23 ENCOUNTER — Encounter (HOSPITAL_COMMUNITY): Payer: Self-pay

## 2021-08-23 DIAGNOSIS — I110 Hypertensive heart disease with heart failure: Secondary | ICD-10-CM | POA: Insufficient documentation

## 2021-08-23 DIAGNOSIS — I5032 Chronic diastolic (congestive) heart failure: Secondary | ICD-10-CM | POA: Diagnosis present

## 2021-08-23 DIAGNOSIS — N179 Acute kidney failure, unspecified: Principal | ICD-10-CM | POA: Diagnosis present

## 2021-08-23 DIAGNOSIS — I5030 Unspecified diastolic (congestive) heart failure: Secondary | ICD-10-CM | POA: Insufficient documentation

## 2021-08-23 DIAGNOSIS — Z7901 Long term (current) use of anticoagulants: Secondary | ICD-10-CM

## 2021-08-23 DIAGNOSIS — M25561 Pain in right knee: Secondary | ICD-10-CM | POA: Insufficient documentation

## 2021-08-23 DIAGNOSIS — A419 Sepsis, unspecified organism: Secondary | ICD-10-CM | POA: Diagnosis not present

## 2021-08-23 DIAGNOSIS — F17210 Nicotine dependence, cigarettes, uncomplicated: Secondary | ICD-10-CM | POA: Insufficient documentation

## 2021-08-23 DIAGNOSIS — J449 Chronic obstructive pulmonary disease, unspecified: Secondary | ICD-10-CM | POA: Diagnosis present

## 2021-08-23 DIAGNOSIS — T68XXXA Hypothermia, initial encounter: Secondary | ICD-10-CM | POA: Diagnosis present

## 2021-08-23 DIAGNOSIS — I252 Old myocardial infarction: Secondary | ICD-10-CM | POA: Diagnosis not present

## 2021-08-23 DIAGNOSIS — D72829 Elevated white blood cell count, unspecified: Secondary | ICD-10-CM | POA: Diagnosis not present

## 2021-08-23 DIAGNOSIS — M25562 Pain in left knee: Secondary | ICD-10-CM | POA: Diagnosis not present

## 2021-08-23 DIAGNOSIS — G894 Chronic pain syndrome: Secondary | ICD-10-CM | POA: Diagnosis not present

## 2021-08-23 DIAGNOSIS — R6520 Severe sepsis without septic shock: Secondary | ICD-10-CM | POA: Insufficient documentation

## 2021-08-23 DIAGNOSIS — N3000 Acute cystitis without hematuria: Secondary | ICD-10-CM | POA: Insufficient documentation

## 2021-08-23 DIAGNOSIS — R680 Hypothermia, not associated with low environmental temperature: Secondary | ICD-10-CM | POA: Insufficient documentation

## 2021-08-23 DIAGNOSIS — I10 Essential (primary) hypertension: Secondary | ICD-10-CM | POA: Diagnosis present

## 2021-08-23 DIAGNOSIS — M153 Secondary multiple arthritis: Secondary | ICD-10-CM | POA: Diagnosis present

## 2021-08-23 DIAGNOSIS — F411 Generalized anxiety disorder: Secondary | ICD-10-CM | POA: Diagnosis present

## 2021-08-23 DIAGNOSIS — D751 Secondary polycythemia: Secondary | ICD-10-CM | POA: Diagnosis not present

## 2021-08-23 DIAGNOSIS — M792 Neuralgia and neuritis, unspecified: Secondary | ICD-10-CM | POA: Diagnosis present

## 2021-08-23 DIAGNOSIS — Z7902 Long term (current) use of antithrombotics/antiplatelets: Secondary | ICD-10-CM

## 2021-08-23 DIAGNOSIS — M1990 Unspecified osteoarthritis, unspecified site: Secondary | ICD-10-CM | POA: Diagnosis not present

## 2021-08-23 DIAGNOSIS — T68XXXD Hypothermia, subsequent encounter: Secondary | ICD-10-CM | POA: Diagnosis not present

## 2021-08-23 DIAGNOSIS — Z789 Other specified health status: Secondary | ICD-10-CM | POA: Diagnosis present

## 2021-08-23 DIAGNOSIS — F1190 Opioid use, unspecified, uncomplicated: Secondary | ICD-10-CM | POA: Diagnosis present

## 2021-08-23 DIAGNOSIS — Z79899 Other long term (current) drug therapy: Secondary | ICD-10-CM | POA: Insufficient documentation

## 2021-08-23 DIAGNOSIS — M5459 Other low back pain: Secondary | ICD-10-CM | POA: Diagnosis not present

## 2021-08-23 DIAGNOSIS — R4182 Altered mental status, unspecified: Secondary | ICD-10-CM | POA: Diagnosis present

## 2021-08-23 DIAGNOSIS — G8929 Other chronic pain: Secondary | ICD-10-CM | POA: Diagnosis present

## 2021-08-23 DIAGNOSIS — K219 Gastro-esophageal reflux disease without esophagitis: Secondary | ICD-10-CM | POA: Diagnosis present

## 2021-08-23 DIAGNOSIS — E785 Hyperlipidemia, unspecified: Secondary | ICD-10-CM | POA: Diagnosis present

## 2021-08-23 DIAGNOSIS — Z96643 Presence of artificial hip joint, bilateral: Secondary | ICD-10-CM | POA: Insufficient documentation

## 2021-08-23 DIAGNOSIS — J45909 Unspecified asthma, uncomplicated: Secondary | ICD-10-CM | POA: Insufficient documentation

## 2021-08-23 LAB — CBC WITH DIFFERENTIAL/PLATELET
Abs Immature Granulocytes: 0.07 10*3/uL (ref 0.00–0.07)
Basophils Absolute: 0.1 10*3/uL (ref 0.0–0.1)
Basophils Relative: 1 %
Eosinophils Absolute: 0.2 10*3/uL (ref 0.0–0.5)
Eosinophils Relative: 1 %
HCT: 47.1 % — ABNORMAL HIGH (ref 36.0–46.0)
Hemoglobin: 15.5 g/dL — ABNORMAL HIGH (ref 12.0–15.0)
Immature Granulocytes: 1 %
Lymphocytes Relative: 12 %
Lymphs Abs: 1.7 10*3/uL (ref 0.7–4.0)
MCH: 28.9 pg (ref 26.0–34.0)
MCHC: 32.9 g/dL (ref 30.0–36.0)
MCV: 87.7 fL (ref 80.0–100.0)
Monocytes Absolute: 1.2 10*3/uL — ABNORMAL HIGH (ref 0.1–1.0)
Monocytes Relative: 8 %
Neutro Abs: 11.3 10*3/uL — ABNORMAL HIGH (ref 1.7–7.7)
Neutrophils Relative %: 77 %
Platelets: 244 10*3/uL (ref 150–400)
RBC: 5.37 MIL/uL — ABNORMAL HIGH (ref 3.87–5.11)
RDW: 15.5 % (ref 11.5–15.5)
WBC: 14.5 10*3/uL — ABNORMAL HIGH (ref 4.0–10.5)
nRBC: 0 % (ref 0.0–0.2)

## 2021-08-23 LAB — COMPREHENSIVE METABOLIC PANEL
ALT: 22 U/L (ref 0–44)
AST: 39 U/L (ref 15–41)
Albumin: 4 g/dL (ref 3.5–5.0)
Alkaline Phosphatase: 86 U/L (ref 38–126)
Anion gap: 13 (ref 5–15)
BUN: 48 mg/dL — ABNORMAL HIGH (ref 8–23)
CO2: 19 mmol/L — ABNORMAL LOW (ref 22–32)
Calcium: 8.9 mg/dL (ref 8.9–10.3)
Chloride: 104 mmol/L (ref 98–111)
Creatinine, Ser: 2.33 mg/dL — ABNORMAL HIGH (ref 0.44–1.00)
GFR, Estimated: 23 mL/min — ABNORMAL LOW (ref >60)
Glucose, Bld: 119 mg/dL — ABNORMAL HIGH (ref 70–99)
Potassium: 4.4 mmol/L (ref 3.5–5.1)
Sodium: 136 mmol/L (ref 135–145)
Total Bilirubin: 0.3 mg/dL (ref 0.3–1.2)
Total Protein: 7.3 g/dL (ref 6.5–8.1)

## 2021-08-23 LAB — URINALYSIS, ROUTINE W REFLEX MICROSCOPIC
Bilirubin Urine: NEGATIVE
Glucose, UA: NEGATIVE mg/dL
Ketones, ur: NEGATIVE mg/dL
Nitrite: NEGATIVE
Protein, ur: 100 mg/dL — AB
Specific Gravity, Urine: 1.018 (ref 1.005–1.030)
WBC, UA: >50 WBC/hpf — ABNORMAL HIGH (ref 0–5)
pH: 5 (ref 5.0–8.0)

## 2021-08-23 LAB — RAPID URINE DRUG SCREEN, HOSP PERFORMED
Amphetamines: NOT DETECTED
Barbiturates: NOT DETECTED
Benzodiazepines: NOT DETECTED
Cocaine: NOT DETECTED
Opiates: POSITIVE — AB
Tetrahydrocannabinol: NOT DETECTED

## 2021-08-23 LAB — I-STAT CHEM 8, ED
BUN: 48 mg/dL — ABNORMAL HIGH (ref 8–23)
Calcium, Ion: 1.12 mmol/L — ABNORMAL LOW (ref 1.15–1.40)
Chloride: 101 mmol/L (ref 98–111)
Creatinine, Ser: 2.5 mg/dL — ABNORMAL HIGH (ref 0.44–1.00)
Glucose, Bld: 117 mg/dL — ABNORMAL HIGH (ref 70–99)
HCT: 47 % — ABNORMAL HIGH (ref 36.0–46.0)
Hemoglobin: 16 g/dL — ABNORMAL HIGH (ref 12.0–15.0)
Potassium: 4.5 mmol/L (ref 3.5–5.1)
Sodium: 139 mmol/L (ref 135–145)
TCO2: 29 mmol/L (ref 22–32)

## 2021-08-23 LAB — CBG MONITORING, ED: Glucose-Capillary: 181 mg/dL — ABNORMAL HIGH (ref 70–99)

## 2021-08-23 LAB — LACTIC ACID, PLASMA
Lactic Acid, Venous: 2.6 mmol/L (ref 0.5–1.9)
Lactic Acid, Venous: 1.4 mmol/L (ref 0.5–1.9)

## 2021-08-23 LAB — MAGNESIUM: Magnesium: 2.3 mg/dL (ref 1.7–2.4)

## 2021-08-23 LAB — ACETAMINOPHEN LEVEL: Acetaminophen (Tylenol), Serum: <10 ug/mL — ABNORMAL LOW (ref 10–30)

## 2021-08-23 IMAGING — CT CT HEAD W/O CM
2 of 3 series · 15 of 40 positions shown, 18 images · non-contrast
Comparison: CT head [DATE]

CLINICAL DATA: Mental status change



[Series 2: head w o · axial · 0.40mm/px · z∈[+24,+159]mm · 12 of 33 slices shown, 15 images]
[im 3/33  brain]
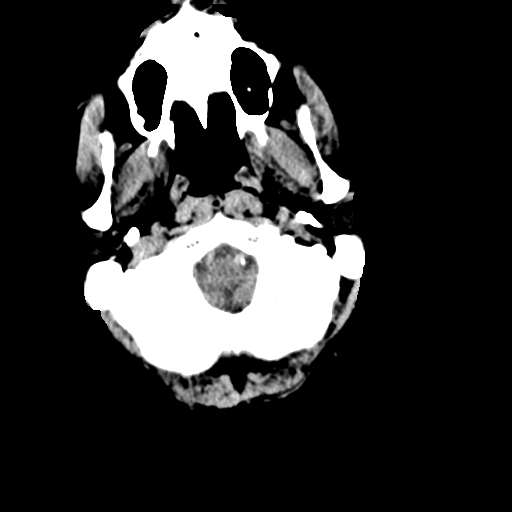
[im 3/33  bone]
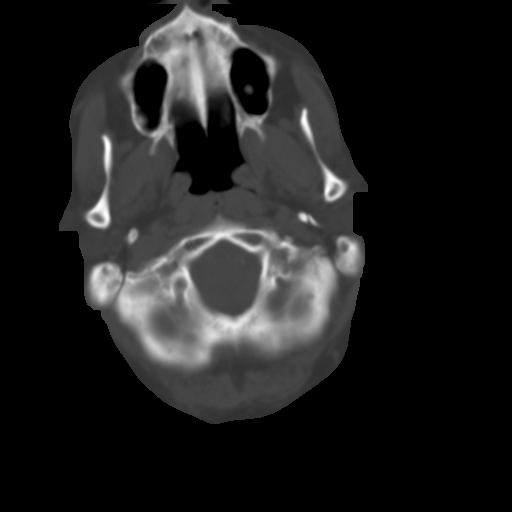
[im 5/33  brain]
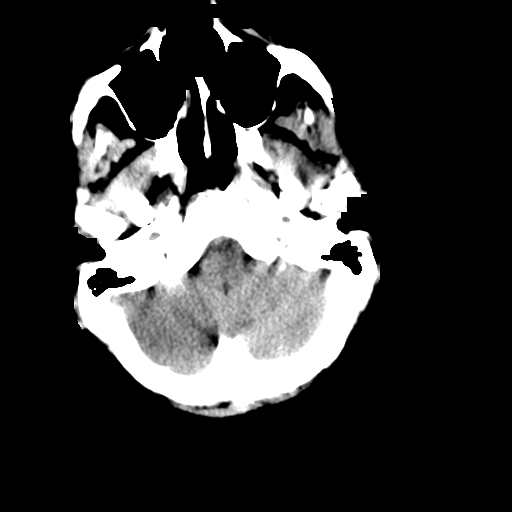
[im 7/33  brain]
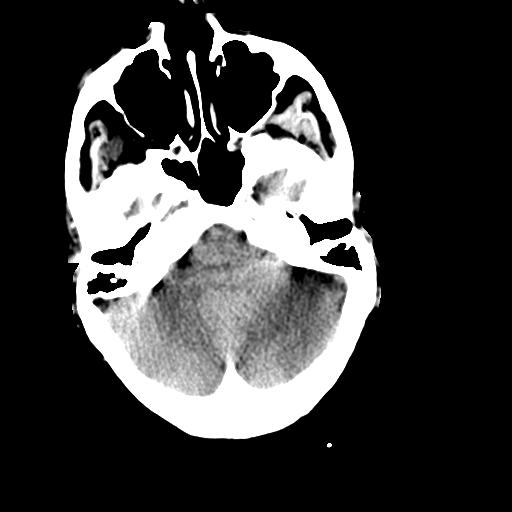
[im 10/33  brain]
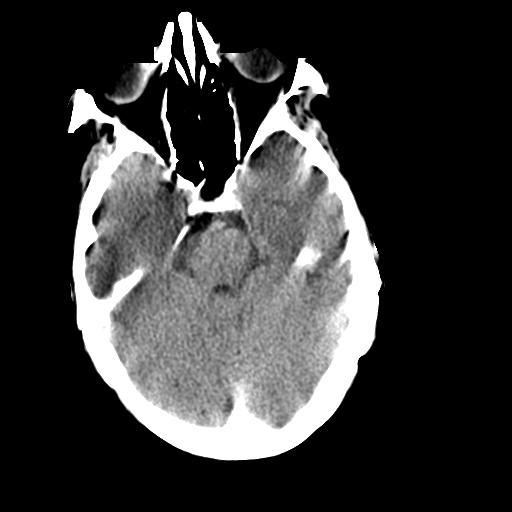
[im 12/33  brain]
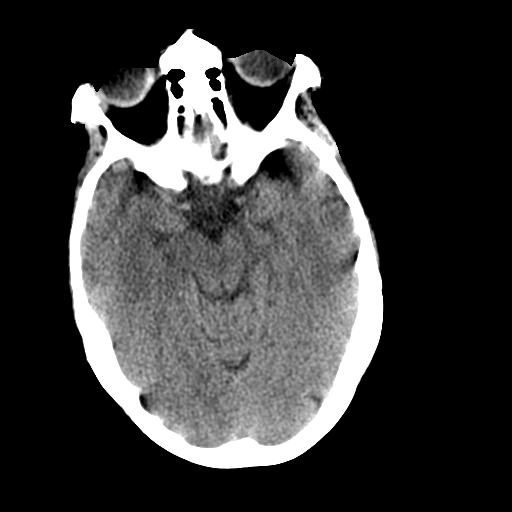
[im 12/33  bone]
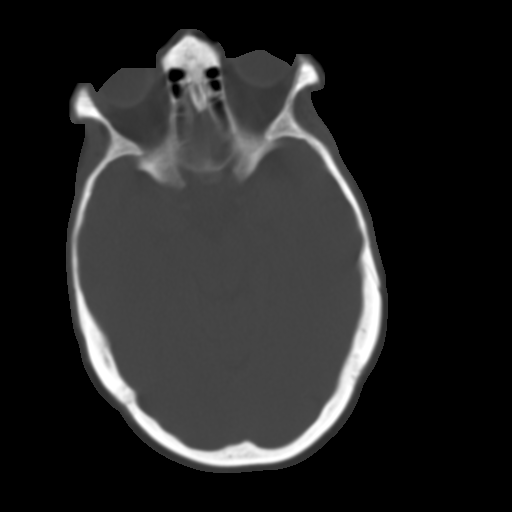
[im 14/33  brain]
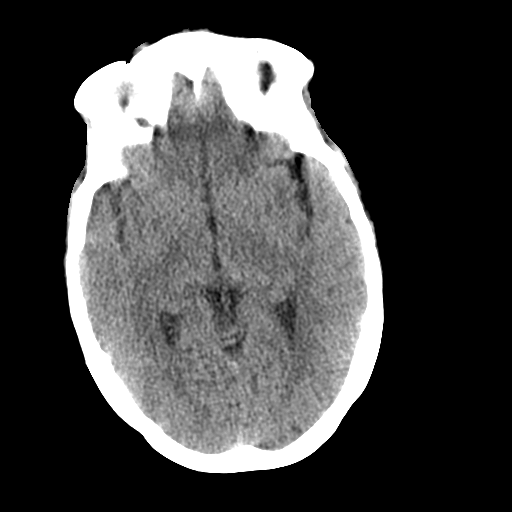
[im 19/33  brain]
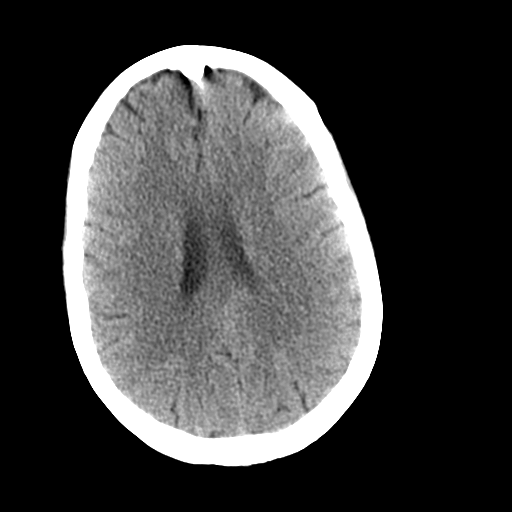
[im 21/33  brain]
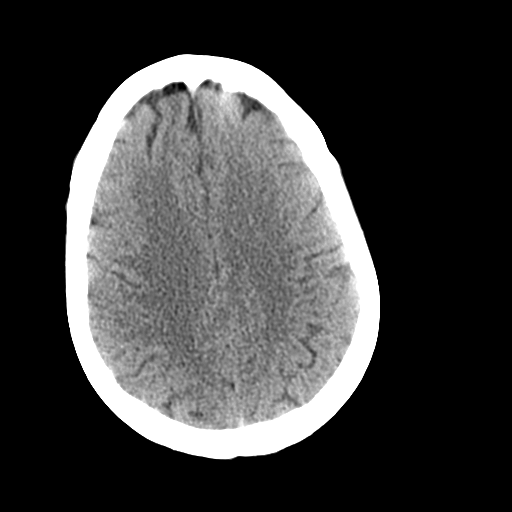
[im 23/33  brain]
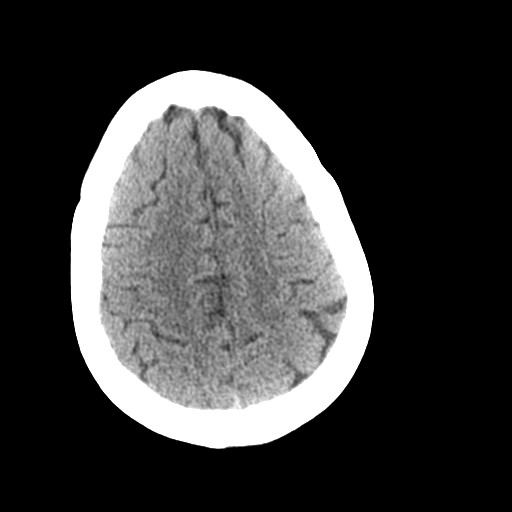
[im 23/33  bone]
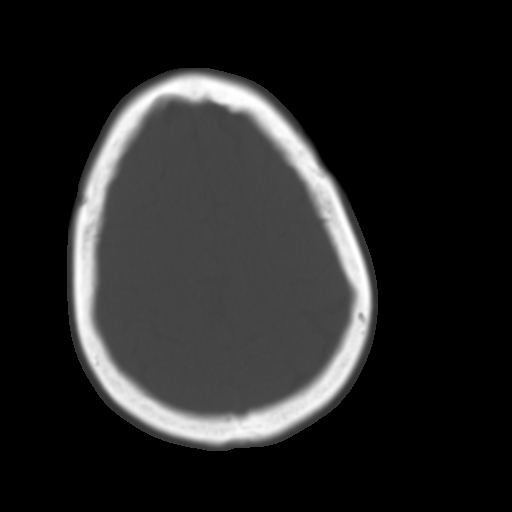
[im 26/33  brain]
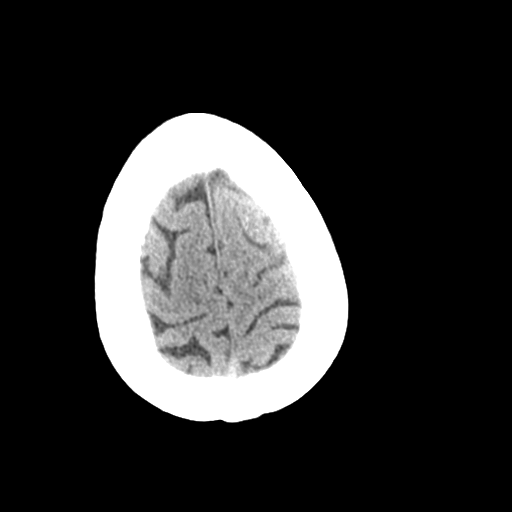
[im 28/33  brain]
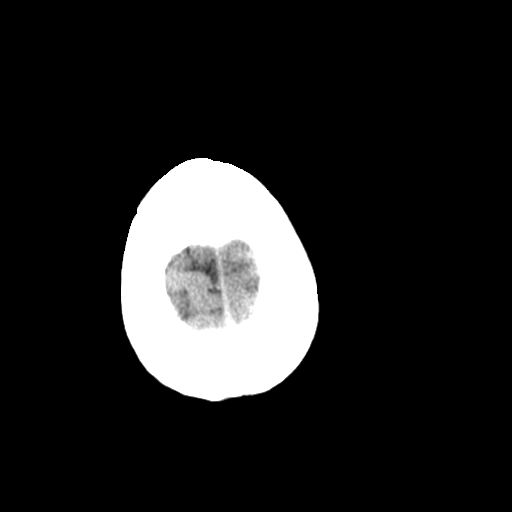
[im 30/33  brain]
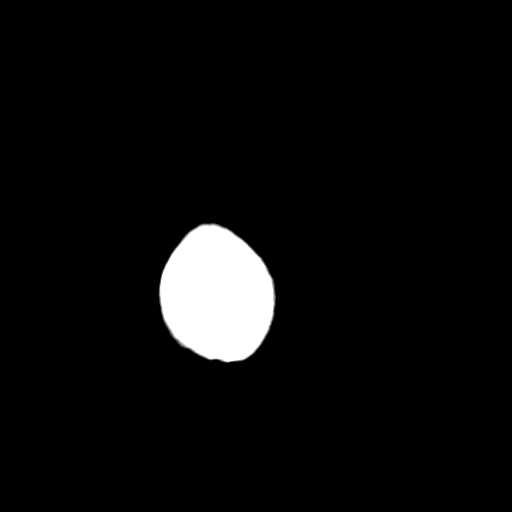

[Series 4: coronal soft · coronal · 0.33mm/px · 3 of 72 slices shown]
[im 24/72  brain]
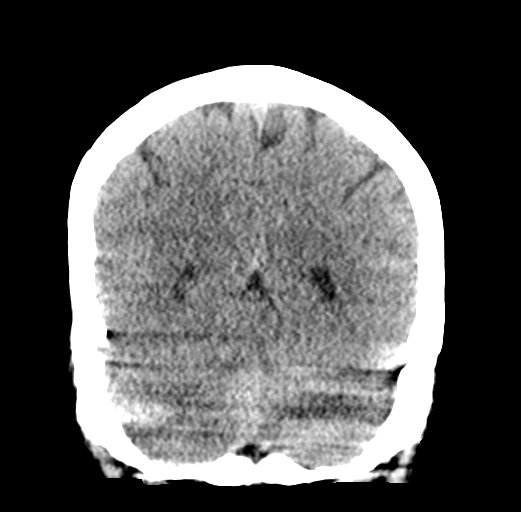
[im 32/72  brain]
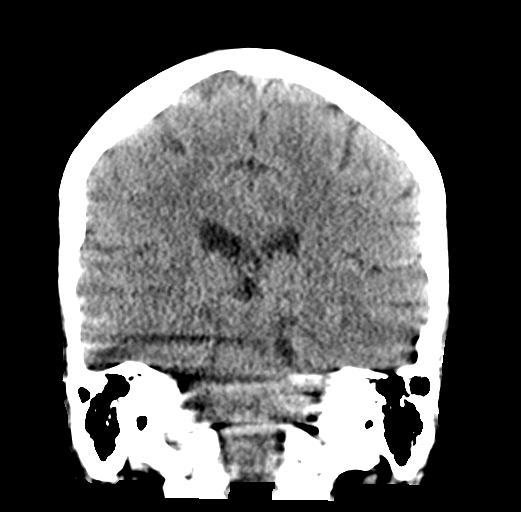
[im 40/72  brain]
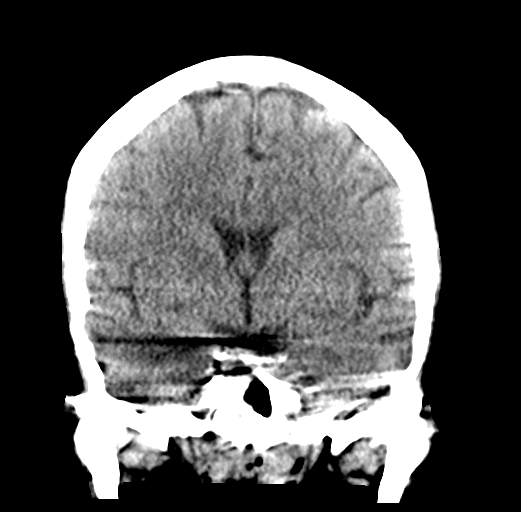

[15 of 40 positions shown; findings below may reference images not displayed]

FINDINGS: Brain: No evidence of acute infarction, hemorrhage, hydrocephalus,
extra-axial collection or mass lesion/mass effect.

Motion degraded study

Vascular: Negative for hyperdense vessel

Skull: Negative

Sinuses/Orbits: Paranasal sinuses clear.  Negative orbit

Other: None
IMPRESSION: Negative CT head

Image quality degraded by motion

## 2021-08-23 IMAGING — DX DG CHEST 1V PORT
1 series · 1 of 1 positions shown · non-contrast
Comparison: Portable exam [FL] hours compared to [DATE]

CLINICAL DATA: Altered mental status questionable sepsis,
whole-body muscle spasms

EXAM:
PORTABLE CHEST 1 VIEW

[chest ap]
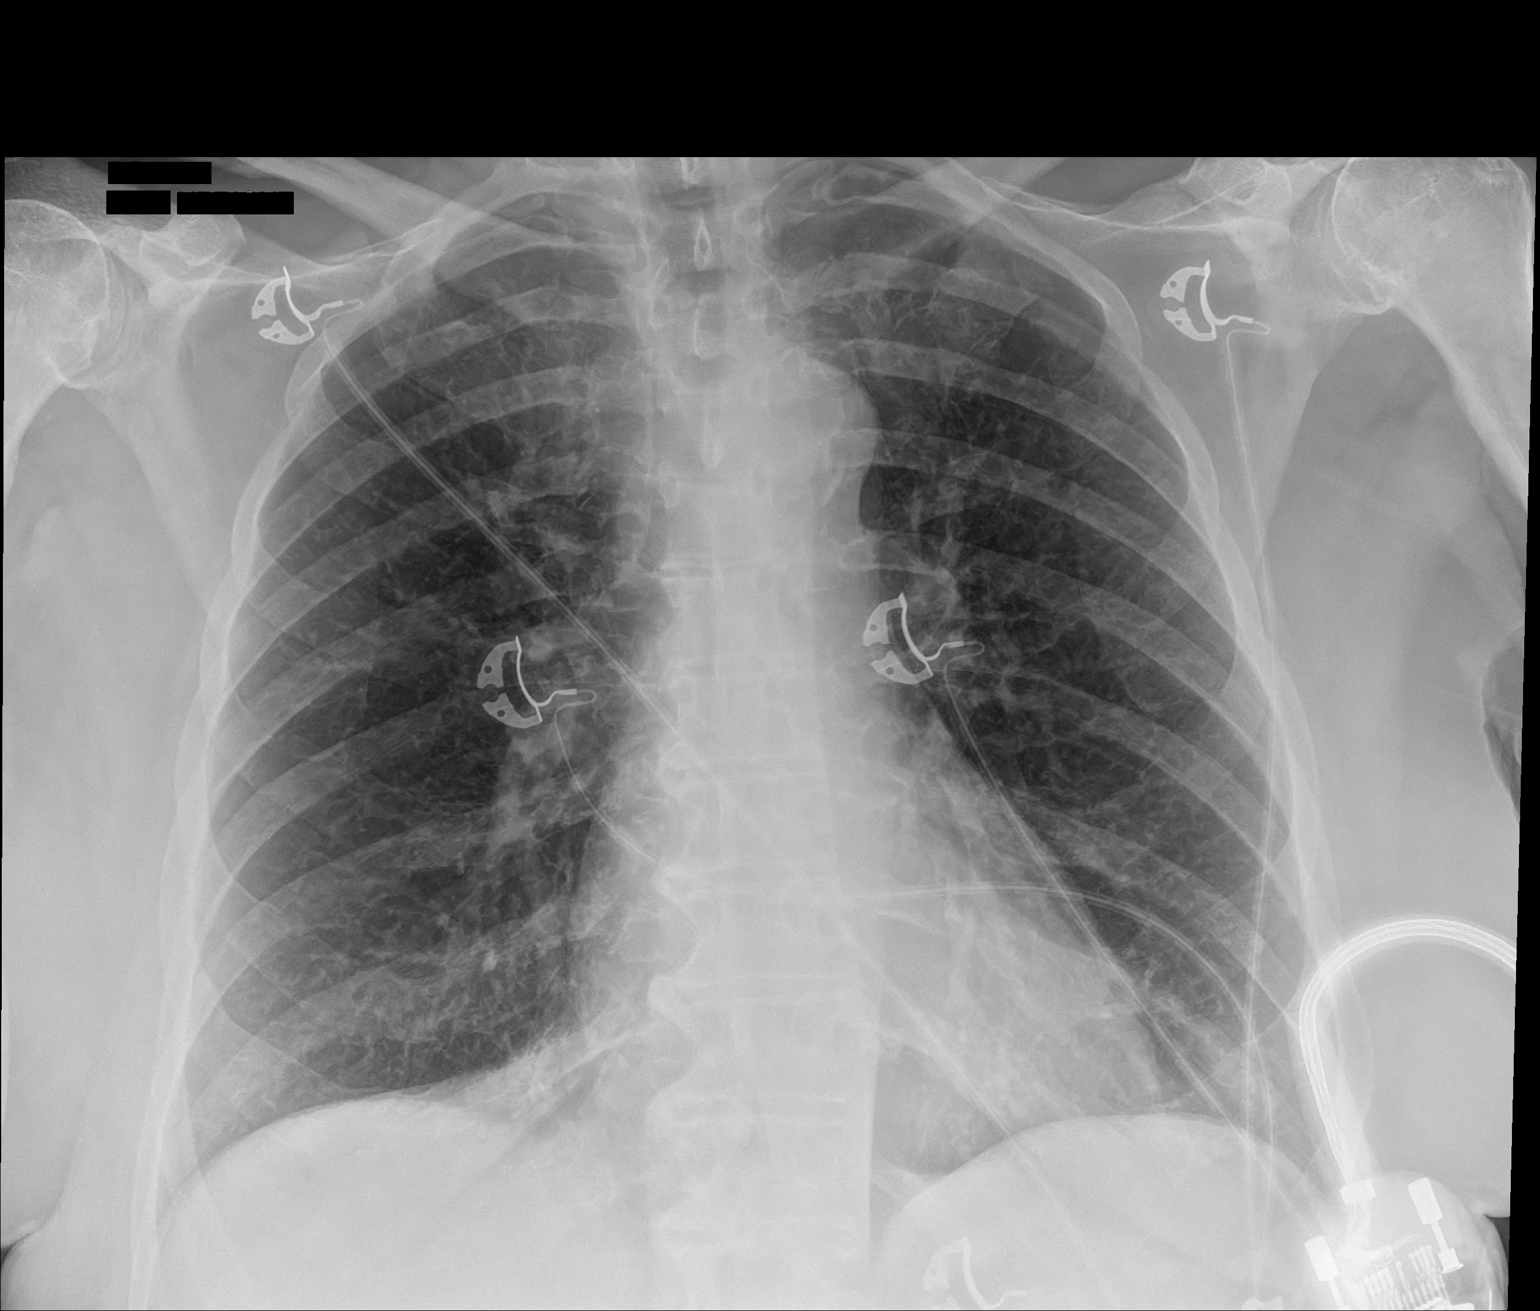

[1 of 1 positions shown; findings below may reference images not displayed]

FINDINGS: Normal heart size, mediastinal contours, and pulmonary vascularity.

Atherosclerotic calcification aorta.

Lungs clear.

No infiltrate, pleural effusion, or pneumothorax.

Scattered endplate spur formation thoracic spine.
IMPRESSION: No acute abnormalities.

Aortic Atherosclerosis ([FL]-[FL]).

## 2021-08-23 MED ORDER — ATORVASTATIN CALCIUM 40 MG PO TABS
80.0000 mg | ORAL_TABLET | Freq: Every evening | ORAL | Status: DC
  Administered 2021-08-23: 80 mg via ORAL
  Filled 2021-08-23: qty 2

## 2021-08-23 MED ORDER — LACTATED RINGERS IV BOLUS (SEPSIS)
1000.0000 mL | Freq: Once | INTRAVENOUS | Status: AC
Start: 2021-08-23 — End: 2021-08-23
  Administered 2021-08-23: 1000 mL via INTRAVENOUS

## 2021-08-23 MED ORDER — LACTATED RINGERS IV SOLN: INTRAVENOUS | Status: DC

## 2021-08-23 MED ORDER — ENOXAPARIN SODIUM 30 MG/0.3ML IJ SOSY
30.0000 mg | PREFILLED_SYRINGE | Freq: Every day | INTRAMUSCULAR | Status: DC
  Administered 2021-08-23: 30 mg via SUBCUTANEOUS
  Filled 2021-08-23 (×2): qty 0.3

## 2021-08-23 MED ORDER — ACETAMINOPHEN 325 MG PO TABS: 650.0000 mg | ORAL_TABLET | Freq: Four times a day (QID) | ORAL | Status: DC | PRN

## 2021-08-23 MED ORDER — DICYCLOMINE HCL 10 MG PO CAPS: 10.0000 mg | ORAL_CAPSULE | Freq: Three times a day (TID) | ORAL | Status: DC | PRN

## 2021-08-23 MED ORDER — ONDANSETRON HCL 4 MG/2ML IJ SOLN
4.0000 mg | Freq: Four times a day (QID) | INTRAMUSCULAR | Status: DC | PRN
Start: 2021-08-23 — End: 2021-08-24

## 2021-08-23 MED ORDER — LACTATED RINGERS IV SOLN
INTRAVENOUS | Status: DC
Start: 2021-08-23 — End: 2021-08-23

## 2021-08-23 MED ORDER — LEVOTHYROXINE SODIUM 75 MCG PO TABS
75.0000 ug | ORAL_TABLET | Freq: Every day | ORAL | Status: DC
  Administered 2021-08-24: 75 ug via ORAL
  Filled 2021-08-23: qty 1

## 2021-08-23 MED ORDER — VANCOMYCIN HCL 1500 MG/300ML IV SOLN
1500.0000 mg | Freq: Once | INTRAVENOUS | Status: AC
  Administered 2021-08-23: 1500 mg via INTRAVENOUS
  Filled 2021-08-23: qty 300

## 2021-08-23 MED ORDER — OXYBUTYNIN CHLORIDE ER 5 MG PO TB24
5.0000 mg | ORAL_TABLET | Freq: Every day | ORAL | Status: DC
  Administered 2021-08-24: 5 mg via ORAL
  Filled 2021-08-23: qty 1

## 2021-08-23 MED ORDER — OXYCODONE HCL 5 MG PO TABS
5.0000 mg | ORAL_TABLET | Freq: Four times a day (QID) | ORAL | Status: DC | PRN
  Administered 2021-08-23 – 2021-08-24 (×4): 5 mg via ORAL
  Filled 2021-08-23 (×4): qty 1

## 2021-08-23 MED ORDER — CLOPIDOGREL BISULFATE 75 MG PO TABS
75.0000 mg | ORAL_TABLET | Freq: Every day | ORAL | Status: DC
  Administered 2021-08-24: 75 mg via ORAL
  Filled 2021-08-23: qty 1

## 2021-08-23 MED ORDER — ALBUTEROL SULFATE (2.5 MG/3ML) 0.083% IN NEBU: 2.5000 mg | INHALATION_SOLUTION | RESPIRATORY_TRACT | Status: DC | PRN

## 2021-08-23 MED ORDER — PANTOPRAZOLE SODIUM 40 MG PO TBEC
40.0000 mg | DELAYED_RELEASE_TABLET | Freq: Every evening | ORAL | Status: DC
  Administered 2021-08-23: 40 mg via ORAL
  Filled 2021-08-23: qty 1

## 2021-08-23 MED ORDER — MOMETASONE FURO-FORMOTEROL FUM 200-5 MCG/ACT IN AERO
2.0000 | INHALATION_SPRAY | Freq: Two times a day (BID) | RESPIRATORY_TRACT | Status: DC
  Administered 2021-08-23 – 2021-08-24 (×2): 2 via RESPIRATORY_TRACT
  Filled 2021-08-23: qty 8.8

## 2021-08-23 MED ORDER — NALOXONE HCL 0.4 MG/ML IJ SOLN
0.4000 mg | Freq: Once | INTRAMUSCULAR | Status: AC
  Administered 2021-08-23: 0.4 mg via INTRAVENOUS
  Filled 2021-08-23: qty 1

## 2021-08-23 MED ORDER — SENNOSIDES-DOCUSATE SODIUM 8.6-50 MG PO TABS
2.0000 | ORAL_TABLET | Freq: Two times a day (BID) | ORAL | Status: DC
  Administered 2021-08-23 – 2021-08-24 (×3): 2 via ORAL
  Filled 2021-08-23 (×3): qty 2

## 2021-08-23 MED ORDER — ACETAMINOPHEN 650 MG RE SUPP: 650.0000 mg | Freq: Four times a day (QID) | RECTAL | Status: DC | PRN

## 2021-08-23 MED ORDER — ONDANSETRON HCL 4 MG PO TABS: 4.0000 mg | ORAL_TABLET | Freq: Four times a day (QID) | ORAL | Status: DC | PRN

## 2021-08-23 MED ORDER — CLONAZEPAM 0.125 MG PO TBDP: 0.2500 mg | ORAL_TABLET | Freq: Three times a day (TID) | ORAL | Status: DC | PRN

## 2021-08-23 MED ORDER — FLUTICASONE PROPIONATE 50 MCG/ACT NA SUSP: 2.0000 | Freq: Every day | NASAL | Status: DC | PRN

## 2021-08-23 MED ORDER — TRAZODONE HCL 50 MG PO TABS: 25.0000 mg | ORAL_TABLET | Freq: Every evening | ORAL | Status: DC | PRN

## 2021-08-23 NOTE — Progress Notes (Signed)
Pharmacy Antibiotic Note ? ?Jessica Bell is a 61 y.o. female admitted on 08/23/2021 with sepsis.  Pharmacy has been consulted for Vancomycin dosing. Patient noted to be in acute renal failure, will need to dose cautiously as renal function changes. ? ?Plan: ?Vancomycin 1500mg  IV x 1 ?Scr daily and redose as needed ?May check random levels to guide re-dosing.  ? ? ?Height: 5\' 3"  (160 cm) ?Weight: 72.6 kg (160 lb) ?IBW/kg (Calculated) : 52.4 ? ?Temp (24hrs), Avg:96.1 ?F (35.6 ?C), Min:96.1 ?F (35.6 ?C), Max:96.1 ?F (35.6 ?C) ? ?Recent Labs  ?Lab 08/23/21 ?1006 08/23/21 ?1009  ?WBC 14.5*  --   ?CREATININE 2.33* 2.50*  ?LATICACIDVEN 2.6*  --   ?  ?Estimated Creatinine Clearance: 22.6 mL/min (A) (by C-G formula based on SCr of 2.5 mg/dL (H)).   ? ?Allergies  ?Allergen Reactions  ? Amoxicillin-Pot Clavulanate Anaphylaxis and Other (See Comments)  ?  N/V/D  ? Seasonal Ic [Cholestatin]   ?  Per pt not allergic to medication-has seasonal allergies  ? ? ?Antimicrobials this admission: ?4/12 Vanc >> ? ? ?Microbiology results: ?4/12 Blood cx >> ?4/12 Urine cx P ? ?Thank you for allowing pharmacy to be a part of this patient?s care. ? ?6/12, PharmD ?Clinical Pharmacist ?08/23/2021 12:18 PM ? ? ? ? ? ? ?

## 2021-08-23 NOTE — Assessment & Plan Note (Addendum)
--   Discussed with patient that she is overmedicated with opioids at this time and we have recommended reducing opioids by 50% ?

## 2021-08-23 NOTE — Assessment & Plan Note (Addendum)
-  outpatient follow up ?
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
## 2021-08-23 NOTE — ED Notes (Signed)
This nurse and lab have attempted cultures x5. PA made aware.  ?

## 2021-08-23 NOTE — ED Notes (Signed)
PA aware of rectal temp. Retail banker in place.  ?

## 2021-08-23 NOTE — Assessment & Plan Note (Addendum)
--   Patient appears to be overmedicated with opioids and sedating medications ?-- Recommend holding or reducing all sedating medications at this time ?--Pt improved markedly after naloxone was given in ED ?

## 2021-08-23 NOTE — Assessment & Plan Note (Signed)
-   Protonix ordered for GI protection 

## 2021-08-23 NOTE — Assessment & Plan Note (Addendum)
--   WBC trending down with treatments ?

## 2021-08-23 NOTE — Sepsis Progress Note (Signed)
ELink tracking the code sepsis.  

## 2021-08-23 NOTE — Progress Notes (Signed)
Patient does uses O2 at home PRN 2L. ?

## 2021-08-23 NOTE — ED Triage Notes (Signed)
Patient via EMS from home due to whole body muscle spasms that started this morning.  ?

## 2021-08-23 NOTE — Assessment & Plan Note (Addendum)
--   Hold oral iron supplement at this time ?-- Hydrated with IV fluid and CBC / Hg normalized ? ?

## 2021-08-23 NOTE — Assessment & Plan Note (Signed)
--   Stable no active symptoms at this time resume home bronchodilators ?

## 2021-08-23 NOTE — Assessment & Plan Note (Signed)
Resume home medications

## 2021-08-23 NOTE — Sepsis Progress Note (Signed)
Notified bedside nurse of need to draw blood cultures.  

## 2021-08-23 NOTE — Assessment & Plan Note (Addendum)
--   Treated with aggressive IV fluid hydration and now creatinine nearly back to normal.  ?

## 2021-08-23 NOTE — Assessment & Plan Note (Addendum)
--  Sepsis has resolved after treatments ?-- Follow urine culture and sensitivity testing still pending at time of discharge.  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

## 2021-08-23 NOTE — ED Notes (Signed)
Xray and lab at bedside.

## 2021-08-23 NOTE — ED Notes (Signed)
Hospitalist made aware of interventions performed for infiltrated IV in right hand.  ?

## 2021-08-23 NOTE — Assessment & Plan Note (Signed)
--   Stable and compensated ?-- Monitor fluid volume status while hydrating aggressively for sepsis, AKI and dehydration ?

## 2021-08-23 NOTE — Assessment & Plan Note (Signed)
--   Restarted home medications, monitor closely ?

## 2021-08-23 NOTE — Assessment & Plan Note (Addendum)
--   resolved now.  ?

## 2021-08-23 NOTE — ED Notes (Signed)
Pharmacy called at this time for treatment of infiltrated IV. Pharmacy stated to elevate extremity and place warm compress. This nurse elevated right extremity and placed warm compress.  ?

## 2021-08-23 NOTE — Assessment & Plan Note (Signed)
--   Resume home cardiac medications ?

## 2021-08-23 NOTE — Assessment & Plan Note (Addendum)
---   AKI resolving now, can resume gabapentin as tolerated. ? ?

## 2021-08-23 NOTE — Assessment & Plan Note (Addendum)
--   Recommended topical treatments as needed to avoid systemic opioids ?

## 2021-08-23 NOTE — ED Notes (Signed)
Upon assessment patient not responding to verbal stimuli. Patient responsive to painful stimuli. PA aware.  ?

## 2021-08-23 NOTE — Assessment & Plan Note (Signed)
--   As noted above we have reduced opioid by 50% given altered mentation and encephalopathy ?

## 2021-08-23 NOTE — Sepsis Progress Note (Signed)
Notified bedside nurse of need to administer antibiotics and draw a repeat Lactate at 1200.  ?

## 2021-08-23 NOTE — Assessment & Plan Note (Signed)
--   Resume home atorvastatin every evening ?

## 2021-08-23 NOTE — ED Provider Notes (Signed)
?[facility] EMERGENCY DEPARTMENT ?Provider Note ? ? ?CSN: [id number] ?Arrival date & time: 08/23/21  0850 ? ?  ? ?History ? ?Chief Complaint  ?Patient presents with  ? Altered Mental Status  ? ? ?Jessica Bell is a 61 y.o. female. ? ?HPI ? ?Patient with medical history including arthritis, CHF, hypertension, ACS, chronic pain presents with complaints of altered mental status.  History is limited due to patient's condition, she states that she is here today because she was having bouts of confusion.  She states that she woke up and was unable to urinate, she states that she had to have her fianc? help her into the bathroom as she was unable to get there on her own.  She also notes that she is having some muscle spasms, she states spasms are all over her body, states she is never had this in the past.  She does not endorse any fevers chills cough congestion chest pain shortness of breath stomach pains nausea vomiting diarrhea no general body aches.  She states that she is having some difficulty urinating and burns with urination is just started today.  No recent falls she is not on any anticoag's. ? ?I reviewed patient's chart and looked at the [facility] database shows that she recent got a prescription for OxyContin and is on gabapentin.  Patient had recent CT head back in 2022 which was negative for any acute findings, patient has been admitted in the past for severe weakness back in October 2022 where she had severe hypokalemia with a potassium 2.1 there is a secondary due to taking Lasix and HCTZ. ? ?Home Medications ?Prior to Admission medications   ?Medication Sig Start Date End Date Taking? Authorizing Provider  ?albuterol (PROVENTIL HFA;VENTOLIN HFA) 108 (90 Base) MCG/ACT inhaler Inhale into the lungs.   Yes [provider]  ?atorvastatin (LIPITOR) 80 MG tablet Take 80 mg by mouth daily. 02/07/21  Yes [provider]  ?baclofen (LIORESAL) 10 MG tablet Take 10 mg by mouth 3 (three) times daily as  needed. 02/24/21  Yes [provider]  ?budesonide-formoterol (SYMBICORT) 160-4.5 MCG/ACT inhaler Inhale 1 puff into the lungs in the morning and at bedtime. 05/23/20  Yes Robinson, [place] N, PA-C  ?carvedilol (COREG) 12.5 MG tablet Take 12.5 mg by mouth 2 (two) times daily. 11/28/20  Yes [provider]  ?clonazePAM (KLONOPIN) 0.5 MG tablet Take 0.5 mg by mouth daily as needed for anxiety. 04/26/21  Yes [provider]  ?clopidogrel (PLAVIX) 75 MG tablet Take 1 tablet (75 mg total) by mouth daily. 03/10/21  Yes Erick Blinks, MD  ?dicyclomine (BENTYL) 10 MG capsule Take 10 mg by mouth 3 (three) times daily as needed for spasms.   Yes [provider]  ?ferrous gluconate (FERGON) 324 MG tablet Take 1 tablet by mouth daily. 07/03/21  Yes [provider]  ?fluticasone (FLONASE) 50 MCG/ACT nasal spray Place 1 spray into both nostrils daily. 12/25/15  Yes [provider]  ?furosemide (LASIX) 20 MG tablet Take 20 mg by mouth daily.   Yes [provider]  ?gabapentin (NEURONTIN) 300 MG capsule Take 2 capsules by mouth 2 (two) times daily. 04/17/21  Yes [provider]  ?levothyroxine (SYNTHROID, LEVOTHROID) 75 MCG tablet Take 75 mcg by mouth daily. 02/14/17  Yes [provider]  ?losartan (COZAAR) 100 MG tablet Take 1 tablet by mouth daily.   Yes [provider]  ?nitroGLYCERIN (NITROSTAT) 0.4 MG SL tablet See admin instructions.   Yes [provider]  ?omeprazole (PRILOSEC) 20 MG capsule Take 1 capsule by mouth 2 (two) times daily.   Yes [provider]  ?oxybutynin (DITROPAN-XL) 5 MG 24 hr tablet Take 5 mg by mouth daily. 02/23/21  Yes [provider]  ?Oxycodone HCl 10 MG TABS Take 10 mg by mouth 3 (three) times daily. 02/25/21  Yes [provider]  ?OXYCONTIN 15 MG 12 hr tablet Take 15 mg by mouth 2 (two) times daily. 02/28/21  Yes [provider]  ?potassium chloride SA (KLOR-CON) 20 MEQ  tablet Take 1 tablet (20 mEq total) by mouth daily. ?Patient not taking: Reported on 08/23/2021 03/10/21   Erick BlinksMemon, Jehanzeb, MD  ?predniSONE (DELTASONE) 10 MG tablet Take 6 tablets (60 mg total) by mouth daily with breakfast. And decrease by one tablet daily ?Patient not taking: Reported on 08/23/2021 05/12/21   Catarina Hartshornat, David, MD  ?   ? ?Allergies    ?Amoxicillin-pot clavulanate and Seasonal ic [cholestatin]   ? ?Review of Systems   ?Review of Systems  ?Unable to perform ROS: Mental status change  ? ?Physical Exam ?Updated Vital Signs ?BP 135/77   Pulse 66   Temp (!) 97.5 ?F (36.4 ?C) (Oral)   Resp 12   Ht 5\' 3"  (1.6 m)   Wt 72.6 kg   SpO2 96%   BMI 28.34 kg/m?  ?Physical Exam ?Vitals and nursing note reviewed.  ?Constitutional:   ?   General: She is not in acute distress. ?   Appearance: She is not ill-appearing.  ?   Comments: Patient is swallowing but easily arousable no acute distress  ?HENT:  ?   Head: Normocephalic and atraumatic.  ?   Comments: No deformity the head present no raccoon eyes or battle sign noted ?   Nose: No congestion.  ?   Mouth/Throat:  ?   Mouth: Mucous membranes are dry.  ?   Pharynx: Oropharynx is clear. No oropharyngeal exudate or posterior oropharyngeal erythema.  ?   Comments: No trismus no torticollis, no oral trauma present ?Eyes:  ?   Extraocular Movements: Extraocular movements intact.  ?   Conjunctiva/sclera: Conjunctivae normal.  ?   Pupils: Pupils are equal, round, and reactive to light.  ?Cardiovascular:  ?   Rate and Rhythm: Normal rate and regular rhythm.  ?   Pulses: Normal pulses.  ?   Heart sounds: No murmur heard. ?  No friction rub. No gallop.  ?Pulmonary:  ?   Effort: No respiratory distress.  ?   Breath sounds: No wheezing, rhonchi or rales.  ?Abdominal:  ?   Palpations: Abdomen is soft.  ?   Tenderness: There is no abdominal tenderness. There is no right CVA tenderness or left CVA tenderness.  ?Musculoskeletal:  ?   Comments: Patient is moving all 4 extremities, she  has bilateral episodic distal tremors, she has 2+ radial pulses and 2+ dorsal pedal pulses bilaterally.  ?Skin: ?   General: Skin is warm and dry.  ?   Comments: Skin exam was performed no noted rashes abrasions or track marks present  ?Neurological:  ?   Mental Status: She is alert.  ?   Cranial Nerves: No cranial nerve deficit or facial asymmetry.  ?   Sensory: Sensation is intact.  ?   Motor: No weakness.  ?   Coordination: Romberg sign negative.  ?   Comments: Patient is alert and orient x3, she is able to answer questions but appears to drift off during conversation, she  is able to identify simple objects like a pen, cranial nerves II through XII grossly intact, able to follow two-step commands, no unilateral weakness present.  ?Psychiatric:     ?   Mood and Affect: Mood normal.  ? ? ?ED Results / Procedures / Treatments   ?Labs ?(all labs ordered are listed, but only abnormal results are displayed) ?Labs Reviewed  ?LACTIC ACID, PLASMA - Abnormal; Notable for the following components:  ?    Result Value  ? Lactic Acid, Venous 2.6 (*)   ? All other components within normal limits  ?COMPREHENSIVE METABOLIC PANEL - Abnormal; Notable for the following components:  ? CO2 19 (*)   ? Glucose, Bld 119 (*)   ? BUN 48 (*)   ? Creatinine, Ser 2.33 (*)   ? GFR, Estimated 23 (*)   ? All other components within normal limits  ?CBC WITH DIFFERENTIAL/PLATELET - Abnormal; Notable for the following components:  ? WBC 14.5 (*)   ? RBC 5.37 (*)   ? Hemoglobin 15.5 (*)   ? HCT 47.1 (*)   ? Neutro Abs 11.3 (*)   ? Monocytes Absolute 1.2 (*)   ? All other components within normal limits  ?URINALYSIS, ROUTINE W REFLEX MICROSCOPIC - Abnormal; Notable for the following components:  ? APPearance CLOUDY (*)   ? Hgb urine dipstick LARGE (*)   ? Protein, ur 100 (*)   ? Leukocytes,Ua LARGE (*)   ? WBC, UA >50 (*)   ? Bacteria, UA MANY (*)   ? All other components within normal limits  ?RAPID URINE DRUG SCREEN, HOSP PERFORMED - Abnormal;  Notable for the following components:  ? Opiates POSITIVE (*)   ? All other components within normal limits  ?CBG MONITORING, ED - Abnormal; Notable for the following components:  ? Glucose-Capillary 181 (*)   ? All

## 2021-08-23 NOTE — Hospital Course (Addendum)
61 year old female with chronic pain issues on chronic opioid medications brought in by EMS when's fianc? found her obtunded lying on the couch earlier this morning.  Patient was given naloxone in the ED with some improvement in mentation.  Patient has been taking medications for muscle spasms and chronic pain and anxiety and also taking gabapentin for neuropathic pain symptoms.  Patient had been complaining of burning with urination since yesterday.  Patient also had denied fever and chills.  Patient denied nausea vomiting and diarrhea.  PDMP database shows recent prescription for OxyContin and gabapentin.  Her urinalysis was positive for infection today.  She was hypothermic.  She was severely dehydrated with an acute kidney injury and was placed on a Bair hugger for hypothermia treatment.  Sepsis protocol was initiated.  She had a lactic acidosis treated with IV fluid hydration.  She also had a leukocytosis.  Her acetaminophen level was less than 10.  Her glucose was 117.  CT head did not show any acute findings.  Chest x-ray with no acute findings.  Blood cultures x2 obtained and admission was requested for further evaluation and management of sepsis. ? ?08/24/2021:  Pt reports feeling much better, wants to go home, creatinine down to 1.3 after IV fluids, she is eating and drinking well with no difficulties and mentation has returned back to baseline.  DC home today.   ?

## 2021-08-23 NOTE — ED Notes (Signed)
Patient more responsive following administration of narcan.  ?

## 2021-08-24 DIAGNOSIS — F411 Generalized anxiety disorder: Secondary | ICD-10-CM | POA: Diagnosis not present

## 2021-08-24 DIAGNOSIS — G894 Chronic pain syndrome: Secondary | ICD-10-CM

## 2021-08-24 DIAGNOSIS — Z7902 Long term (current) use of antithrombotics/antiplatelets: Secondary | ICD-10-CM | POA: Diagnosis not present

## 2021-08-24 DIAGNOSIS — T68XXXD Hypothermia, subsequent encounter: Secondary | ICD-10-CM | POA: Diagnosis not present

## 2021-08-24 DIAGNOSIS — N179 Acute kidney failure, unspecified: Secondary | ICD-10-CM | POA: Diagnosis not present

## 2021-08-24 LAB — MAGNESIUM: Magnesium: 2.1 mg/dL (ref 1.7–2.4)

## 2021-08-24 LAB — BASIC METABOLIC PANEL
Anion gap: 9 (ref 5–15)
BUN: 31 mg/dL — ABNORMAL HIGH (ref 8–23)
CO2: 25 mmol/L (ref 22–32)
Calcium: 8.7 mg/dL — ABNORMAL LOW (ref 8.9–10.3)
Chloride: 106 mmol/L (ref 98–111)
Creatinine, Ser: 1.14 mg/dL — ABNORMAL HIGH (ref 0.44–1.00)
GFR, Estimated: 55 mL/min — ABNORMAL LOW (ref >60)
Glucose, Bld: 106 mg/dL — ABNORMAL HIGH (ref 70–99)
Potassium: 3.3 mmol/L — ABNORMAL LOW (ref 3.5–5.1)
Sodium: 140 mmol/L (ref 135–145)

## 2021-08-24 LAB — CBC
HCT: 41.4 % (ref 36.0–46.0)
Hemoglobin: 13.9 g/dL (ref 12.0–15.0)
MCH: 29.3 pg (ref 26.0–34.0)
MCHC: 33.6 g/dL (ref 30.0–36.0)
MCV: 87.3 fL (ref 80.0–100.0)
Platelets: 241 10*3/uL (ref 150–400)
RBC: 4.74 MIL/uL (ref 3.87–5.11)
RDW: 15.5 % (ref 11.5–15.5)
WBC: 11 10*3/uL — ABNORMAL HIGH (ref 4.0–10.5)
nRBC: 0 % (ref 0.0–0.2)

## 2021-08-24 LAB — PROCALCITONIN: Procalcitonin: <0.1 ng/mL

## 2021-08-24 LAB — PHOSPHORUS: Phosphorus: 3.8 mg/dL (ref 2.5–4.6)

## 2021-08-24 MED ORDER — LOSARTAN POTASSIUM 100 MG PO TABS
50.0000 mg | ORAL_TABLET | Freq: Every day | ORAL | Status: DC
Start: 2021-08-26 — End: 2021-10-16

## 2021-08-24 MED ORDER — CARVEDILOL 12.5 MG PO TABS
6.2500 mg | ORAL_TABLET | Freq: Two times a day (BID) | ORAL | Status: DC
Start: 2021-08-25 — End: 2021-10-16

## 2021-08-24 MED ORDER — VANCOMYCIN HCL 1250 MG/250ML IV SOLN
1250.0000 mg | INTRAVENOUS | Status: DC
  Administered 2021-08-24: 1250 mg via INTRAVENOUS
  Filled 2021-08-24: qty 250

## 2021-08-24 MED ORDER — POTASSIUM CHLORIDE CRYS ER 20 MEQ PO TBCR
40.0000 meq | EXTENDED_RELEASE_TABLET | Freq: Once | ORAL | Status: AC
  Administered 2021-08-24: 40 meq via ORAL
  Filled 2021-08-24: qty 2

## 2021-08-24 MED ORDER — NITROFURANTOIN MONOHYD MACRO 100 MG PO CAPS: 100.0000 mg | ORAL_CAPSULE | Freq: Two times a day (BID) | ORAL | 0 refills | Status: DC

## 2021-08-24 MED ORDER — VANCOMYCIN VARIABLE DOSE PER UNSTABLE RENAL FUNCTION (PHARMACIST DOSING): Status: DC

## 2021-08-24 MED ORDER — OXYCODONE HCL 10 MG PO TABS: 5.0000 mg | ORAL_TABLET | Freq: Three times a day (TID) | ORAL | 0 refills | Status: DC

## 2021-08-24 NOTE — Care Management Obs Status (Signed)
MEDICARE OBSERVATION STATUS NOTIFICATION ? ? ?Patient Details  ?Name: Jessica Bell ?MRN: [id number] ?Date of Birth: Oct 16, 1960 ? ? ?Medicare Observation Status Notification Given:  Yes ? ? ? ?Elliot Gault, LCSW ?08/24/2021, 11:27 AM ?

## 2021-08-24 NOTE — Assessment & Plan Note (Signed)
--  resume home therapy ?

## 2021-08-24 NOTE — TOC Progression Note (Signed)
?  Transition of Care (TOC) Screening Note ? ? ?Patient Details  ?Name: Jessica Bell ?Date of Birth: 04/19/1961 ? ? ?Transition of Care (TOC) CM/SW Contact:    ?Shade Flood, LCSW ?Phone Number: ?08/24/2021, 11:28 AM ? ? ? ?Transition of Care Department [facility]) has reviewed patient and no TOC needs have been identified at this time. We will continue to monitor patient advancement through interdisciplinary progression rounds. If new patient transition needs arise, please place a TOC consult. ? ? ?

## 2021-08-24 NOTE — Discharge Instructions (Signed)
IMPORTANT INFORMATION: PAY CLOSE ATTENTION  ? ?PHYSICIAN DISCHARGE INSTRUCTIONS ? ?Follow with Primary care provider  Joana Reamer, DO  and other consultants as instructed by your Hospitalist Physician ? ?SEEK MEDICAL CARE OR RETURN TO EMERGENCY ROOM IF SYMPTOMS COME BACK, WORSEN OR NEW PROBLEM DEVELOPS  ? ?Please note: ?You were cared for by a hospitalist during your hospital stay. Every effort will be made to forward records to your primary care provider.  You can request that your primary care provider send for your hospital records if they have not received them.  Once you are discharged, your primary care physician will handle any further medical issues. Please note that NO REFILLS for any discharge medications will be authorized once you are discharged, as it is imperative that you return to your primary care physician (or establish a relationship with a primary care physician if you do not have one) for your post hospital discharge needs so that they can reassess your need for medications and monitor your lab values. ? ?Please get a complete blood count and chemistry panel checked by your Primary MD at your next visit, and again as instructed by your Primary MD. ? ?Get Medicines reviewed and adjusted: ?Please take all your medications with you for your next visit with your Primary MD ? ?Laboratory/radiological data: ?Please request your Primary MD to go over all hospital tests and procedure/radiological results at the follow up, please ask your primary care provider to get all Hospital records sent to his/her office. ? ?In some cases, they will be blood work, cultures and biopsy results pending at the time of your discharge. Please request that your primary care provider follow up on these results. ? ?If you are diabetic, please bring your blood sugar readings with you to your follow up appointment with primary care.   ? ?Please call and make your follow up appointments as soon as possible.   ? ?Also  Note the following: ?If you experience worsening of your admission symptoms, develop shortness of breath, life threatening emergency, suicidal or homicidal thoughts you must seek medical attention immediately by calling 911 or calling your MD immediately  if symptoms less severe. ? ?You must read complete instructions/literature along with all the possible adverse reactions/side effects for all the Medicines you take and that have been prescribed to you. Take any new Medicines after you have completely understood and accpet all the possible adverse reactions/side effects.  ? ?Do not drive when taking Pain medications or sleeping medications (Benzodiazepines) ? ?Do not take more than prescribed Pain, Sleep and Anxiety Medications. It is not advisable to combine anxiety,sleep and pain medications without talking with your primary care practitioner ? ?Special Instructions: If you have smoked or chewed Tobacco  in the last 2 yrs please stop smoking, stop any regular Alcohol  and or any Recreational drug use. ? ?Wear Seat belts while driving.  Do not drive if taking any narcotic, mind altering or controlled substances or recreational drugs or alcohol.  ? ? ? ? ? ?

## 2021-08-24 NOTE — Care Management CC44 (Signed)
Condition Code 44 Documentation Completed ? ?Patient Details  ?Name: Jessica Bell ?MRN: [id number] ?Date of Birth: April 04, 1961 ? ? ?Condition Code 44 given:  Yes ?Patient signature on Condition Code 44 notice:  Yes ?Documentation of 2 MD's agreement:  Yes ?Code 44 added to claim:  Yes ? ? ? ?Elliot Gault, LCSW ?08/24/2021, 11:27 AM ? ?

## 2021-08-24 NOTE — Assessment & Plan Note (Signed)
-  outpatient follow up ?

## 2021-08-24 NOTE — Progress Notes (Addendum)
Pharmacy Antibiotic Note ? ?[name] is a 61 y.o. female admitted on 08/23/2021 with sepsis/UTI Pharmacy has been consulted for Vancomycin dosing. Patient noted to be in acute renal failure, will need to dose cautiously as renal function changes. ?Renal function improved with hydration. Scr 2.5> 1.4 ? ?Plan: ?Vancomycin 1250 mg IV Q 24 hrs. Goal AUC 400-550. ?Expected AUC: 468 ?SCr used: 1.4  ?F/U cxs and clinical progress ?Monitor V/S, labs and levels as indicated ? ?Height: 5\' 3"  (160 cm) ?Weight: 74.4 kg (164 lb 0.4 oz) ?IBW/kg (Calculated) : 52.4 ? ?Temp (24hrs), Avg:97.9 ?F (36.6 ?C), Min:96.1 ?F (35.6 ?C), Max:99.6 ?F (37.6 ?C) ? ?Recent Labs  ?Lab 08/23/21 ?1006 08/23/21 ?1009 08/23/21 ?1241 08/24/21 ?0451  ?WBC 14.5*  --   --  11.0*  ?CREATININE 2.33* 2.50*  --  1.14*  ?LATICACIDVEN 2.6*  --  1.4  --   ? ?  ?Estimated Creatinine Clearance: 50.1 mL/min (A) (by C-G formula based on SCr of 1.14 mg/dL (H)).   ? ?Allergies  ?Allergen Reactions  ? Amoxicillin-Pot Clavulanate Anaphylaxis and Other (See Comments)  ?  N/V/D  ? Seasonal Ic [Cholestatin]   ?  Per pt not allergic to medication-has seasonal allergies  ? ? ?Antimicrobials this admission: ?4/12 Vancomycin >> ? ? ?Microbiology results: ?4/12 BCX ngtd ?4/12 UCx:  Pending ? ?Thank you for allowing pharmacy to be a part of this patient?s care. ? ?6/12, BS Pharm D, BCPS ?Clinical Pharmacist ?Pager [phone] ?08/24/2021 9:03 AM ? ? ? ? ? ? ?

## 2021-08-24 NOTE — Evaluation (Signed)
Physical Therapy Evaluation ?Patient Details ?Name: Jessica Bell ?MRN: [id number] ?DOB: 10/08/1960 ?Today's Date: 08/24/2021 ? ?History of Present Illness ? Jessica Bell is a 61 year old female with chronic pain issues on chronic opioid medications brought in by EMS when's fianc? found her obtunded lying on the couch earlier this morning.  Patient was given naloxone in the ED with some improvement in mentation.  Patient has been taking medications for muscle spasms and chronic pain and anxiety and also taking gabapentin for neuropathic pain symptoms.  Patient had been complaining of burning with urination since yesterday.  Patient also had denied fever and chills.  Patient denied nausea vomiting and diarrhea.  PDMP database shows recent prescription for OxyContin and gabapentin.  Her urinalysis was positive for infection today.  She was hypothermic.  She was severely dehydrated with an acute kidney injury and was placed on a Bair hugger for hypothermia treatment.  Sepsis protocol was initiated.  She had a lactic acidosis treated with IV fluid hydration.  She also had a leukocytosis.  Her acetaminophen level was less than 10.  Her glucose was 117.  CT head did not show any acute findings.  Chest x-ray with no acute findings.  Blood cultures x2 obtained and admission was requested for further evaluation and management of sepsis. ?  ?Clinical Impression ? Patient functioning at baseline for functional mobility and gait demonstrating good return for ambulation in room and hallway without loss of balance.  Plan:  Patient discharged from physical therapy to care of nursing for ambulation daily as tolerated for length of stay.  ?   ?   ? ?Recommendations for follow up therapy are one component of a multi-disciplinary discharge planning process, led by the attending physician.  Recommendations may be updated based on patient status, additional functional criteria and insurance authorization. ? ?Follow Up Recommendations No  PT follow up ? ?  ?Assistance Recommended at Discharge PRN  ?Patient can return home with the following ? Other (comment) (at baseline) ? ?  ?Equipment Recommendations None recommended by PT  ?Recommendations for Other Services ?    ?  ?Functional Status Assessment Patient has not had a recent decline in their functional status  ? ?  ?Precautions / Restrictions Precautions ?Precautions: Fall ?Restrictions ?Weight Bearing Restrictions: No  ? ?  ? ?Mobility ? Bed Mobility ?Overal bed mobility: Modified Independent ?  ?  ?  ?  ?  ?  ?  ?  ? ?Transfers ?Overall transfer level: Modified independent ?  ?  ?  ?  ?  ?  ?  ?  ?  ?  ? ?Ambulation/Gait ?Ambulation/Gait assistance: Modified independent (Device/Increase time) ?Gait Distance (Feet): 100 Feet ?Assistive device: Rolling walker (2 wheels) ?Gait Pattern/deviations: Decreased step length - right, Decreased step length - left, Decreased stride length, Trunk flexed ?Gait velocity: decreased ?  ?  ?General Gait Details: slightly labored cadence with good return for ambulation in room and hallway without loss of balance ? ?Stairs ?  ?  ?  ?  ?  ? ?Wheelchair Mobility ?  ? ?Modified Rankin (Stroke Patients Only) ?  ? ?  ? ?Balance Overall balance assessment: Needs assistance ?Sitting-balance support: Feet supported, No upper extremity supported ?Sitting balance-Leahy Scale: Good ?Sitting balance - Comments: seated at EOB ?  ?Standing balance support: During functional activity, Bilateral upper extremity supported ?Standing balance-Leahy Scale: Fair ?Standing balance comment: fair/good using RW ?  ?  ?  ?  ?  ?  ?  ?  ?  ?  ?  ?   ? ? ? ?  Pertinent Vitals/Pain Pain Assessment ?Pain Assessment: 0-10 ?Pain Score: 8  ?Pain Location: back, knees ?Pain Descriptors / Indicators: Sore ?Pain Intervention(s): Limited activity within patient's tolerance, Monitored during session, Repositioned, Premedicated before session  ? ? ?Home Living Family/patient expects to be discharged to::  Private residence ?Living Arrangements: Spouse/significant other ?Available Help at Discharge: Family;Available 24 hours/day ?Type of Home: House ?Home Access: Stairs to enter ?Entrance Stairs-Rails: Right ?Entrance Stairs-Number of Steps: 3 ?  ?Home Layout: One level ?Home Equipment: Agricultural consultant (2 wheels);Standard Walker;Cane - single point;BSC/3in1;Shower seat ?   ?  ?Prior Function Prior Level of Function : Independent/Modified Independent ?  ?  ?  ?  ?  ?  ?Mobility Comments: household and short distanced community ambulator using RW PRN ?ADLs Comments: Independent ?  ? ? ?Hand Dominance  ? Dominant Hand: Right ? ?  ?Extremity/Trunk Assessment  ? Upper Extremity Assessment ?Upper Extremity Assessment: Overall WFL for tasks assessed ?  ? ?Lower Extremity Assessment ?Lower Extremity Assessment: Overall WFL for tasks assessed ?  ? ?Cervical / Trunk Assessment ?Cervical / Trunk Assessment: Kyphotic  ?Communication  ? Communication: No difficulties  ?Cognition Arousal/Alertness: Awake/alert ?Behavior During Therapy: [facility] for tasks assessed/performed ?Overall Cognitive Status: Within Functional Limits for tasks assessed ?  ?  ?  ?  ?  ?  ?  ?  ?  ?  ?  ?  ?  ?  ?  ?  ?  ?  ?  ? ?  ?General Comments   ? ?  ?Exercises    ? ?Assessment/Plan  ?  ?PT Assessment Patient does not need any further PT services  ?PT Problem List   ? ?   ?  ?PT Treatment Interventions     ? ?PT Goals (Current goals can be found in the Care Plan section)  ?Acute Rehab PT Goals ?Patient Stated Goal: return home with family to assist ?PT Goal Formulation: With patient/family ?Time For Goal Achievement: 08/24/21 ?Potential to Achieve Goals: Good ? ?  ?Frequency   ?  ? ? ?Co-evaluation   ?  ?  ?  ?  ? ? ?  ?AM-PAC PT "6 Clicks" Mobility  ?Outcome Measure Help needed turning from your back to your side while in a flat bed without using bedrails?: None ?Help needed moving from lying on your back to sitting on the side of a flat bed without using  bedrails?: None ?Help needed moving to and from a bed to a chair (including a wheelchair)?: None ?Help needed standing up from a chair using your arms (e.g., wheelchair or bedside chair)?: None ?Help needed to walk in hospital room?: None ?Help needed climbing 3-5 steps with a railing? : A Little ?6 Click Score: 23 ? ?  ?End of Session   ?Activity Tolerance: Patient tolerated treatment well;Patient limited by fatigue ?Patient left: in bed;with call bell/phone within reach ?Nurse Communication: Mobility status ?PT Visit Diagnosis: Unsteadiness on feet (R26.81);Other abnormalities of gait and mobility (R26.89);Muscle weakness (generalized) (M62.81) ?  ? ?Time: 0263-7858 ?PT Time Calculation (min) (ACUTE ONLY): 11 min ? ? ?Charges:   PT Evaluation ?$PT Eval Low Complexity: 1 Low ?PT Treatments ?$Therapeutic Activity: 8-22 mins ?  ?   ? ? ?1:43 PM, 08/24/21 ?Ocie Bob, MPT ?Physical Therapist with [place] ?[facility] ?[phone] office ?[phone] mobile phone ? ? ?

## 2021-08-24 NOTE — Progress Notes (Signed)
Nsg Discharge Note ? ?Admit Date:  08/23/2021 ?Discharge date: 08/24/2021 ?  ?Ralph Dowdy to be D/C'd Home per MD order.  AVS completed.  ?Patient/caregiver able to verbalize understanding. ? ?Discharge Medication: ?Allergies as of 08/24/2021   ? ?   Reactions  ? Amoxicillin-pot Clavulanate Anaphylaxis, Other (See Comments)  ? N/V/D  ? Seasonal Ic [cholestatin]   ? Per pt not allergic to medication-has seasonal allergies  ? ?  ? ?  ?Medication List  ?  ? ?STOP taking these medications   ? ?furosemide 20 MG tablet ?Commonly known as: LASIX ?  ?potassium chloride SA 20 MEQ tablet ?Commonly known as: KLOR-CON M ?  ? ?  ? ?TAKE these medications   ? ?albuterol 108 (90 Base) MCG/ACT inhaler ?Commonly known as: VENTOLIN HFA ?Inhale into the lungs. ?  ?atorvastatin 80 MG tablet ?Commonly known as: LIPITOR ?Take 80 mg by mouth daily. ?  ?baclofen 10 MG tablet ?Commonly known as: LIORESAL ?Take 10 mg by mouth 3 (three) times daily as needed. ?  ?budesonide-formoterol 160-4.5 MCG/ACT inhaler ?Commonly known as: Symbicort ?Inhale 1 puff into the lungs in the morning and at bedtime. ?  ?carvedilol 12.5 MG tablet ?Commonly known as: COREG ?Take 0.5 tablets (6.25 mg total) by mouth 2 (two) times daily. ?Start taking on: August 25, 2021 ?What changed: how much to take ?  ?clonazePAM 0.5 MG tablet ?Commonly known as: KLONOPIN ?Take 0.5 mg by mouth daily as needed for anxiety. ?  ?clopidogrel 75 MG tablet ?Commonly known as: PLAVIX ?Take 1 tablet (75 mg total) by mouth daily. ?  ?dicyclomine 10 MG capsule ?Commonly known as: BENTYL ?Take 10 mg by mouth 3 (three) times daily as needed for spasms. ?  ?ferrous gluconate 324 MG tablet ?Commonly known as: FERGON ?Take 1 tablet by mouth daily. ?  ?fluticasone 50 MCG/ACT nasal spray ?Commonly known as: FLONASE ?Place 1 spray into both nostrils daily. ?  ?gabapentin 300 MG capsule ?Commonly known as: NEURONTIN ?Take 2 capsules by mouth 2 (two) times daily. ?  ?levothyroxine 75 MCG  tablet ?Commonly known as: SYNTHROID ?Take 75 mcg by mouth daily. ?  ?losartan 100 MG tablet ?Commonly known as: COZAAR ?Take 0.5 tablets (50 mg total) by mouth daily. ?Start taking on: August 26, 2021 ?What changed:  ?how much to take ?These instructions start on August 26, 2021. If you are unsure what to do until then, ask your doctor or other care provider. ?  ?nitrofurantoin (macrocrystal-monohydrate) 100 MG capsule ?Commonly known as: Macrobid ?Take 1 capsule (100 mg total) by mouth 2 (two) times daily for 3 days. ?  ?nitroGLYCERIN 0.4 MG SL tablet ?Commonly known as: NITROSTAT ?See admin instructions. ?  ?omeprazole 20 MG capsule ?Commonly known as: PRILOSEC ?Take 1 capsule by mouth 2 (two) times daily. ?  ?oxybutynin 5 MG 24 hr tablet ?Commonly known as: DITROPAN-XL ?Take 5 mg by mouth daily. ?  ?OxyCONTIN 15 mg 12 hr tablet ?Generic drug: oxyCODONE ?Take 15 mg by mouth 2 (two) times daily. ?What changed: Another medication with the same name was changed. Make sure you understand how and when to take each. ?  ?Oxycodone HCl 10 MG Tabs ?Take 0.5 tablets (5 mg total) by mouth 3 (three) times daily. ?What changed: how much to take ?  ? ?  ? ? ?Discharge Assessment: ?Vitals:  ? 08/24/21 0455 08/24/21 1326  ?BP: 139/70 133/64  ?Pulse: 85 77  ?Resp: 18 20  ?Temp: 98 ?F (36.7 ?C) 97.8 ?F (  36.6 ?C)  ?SpO2: 99% 100%  ? Skin clean, dry and intact without evidence of skin break down, no evidence of skin tears noted. ?IV catheter discontinued intact. Site without signs and symptoms of complications - no redness or edema noted at insertion site, patient denies c/o pain - only slight tenderness at site.  Dressing with slight pressure applied. ? ?D/c Instructions-Education: ?Discharge instructions given to patient/family with verbalized understanding. ?D/c education completed with patient/family including follow up instructions, medication list, d/c activities limitations if indicated, with other d/c instructions as indicated  by MD - patient able to verbalize understanding, all questions fully answered. ?Patient instructed to return to ED, call 911, or call MD for any changes in condition.  ?Patient escorted via [place], and D/C home via private auto. ? ?Kathie Rhodes, RN ?08/24/2021 1:40 PM  ?

## 2021-08-24 NOTE — Discharge Summary (Signed)
Physician Discharge Summary  ?Jessica HoseLaura S Mote ZOX:[id number] DOB: [birth year] DOA: 08/23/2021 ? ?PCP: Joana ReamerMullis, Kiersten P, DO ? ?Admit date: 08/23/2021 ?Discharge date: 08/24/2021 ? ?Admitted From:  Home  ?Disposition: Home  ? ?Recommendations for Outpatient Follow-up:  ?Follow up with PCP in 1 weeks ?Please help patient to wean down on sedating drugs as soon as possible ?Please get patient into outpatient opioid addiction treatment program ?Please obtain BMP/CBC in 1-2 weeks ?Please follow up on the following pending results: final urine culture results  ? ?Discharge Condition: STABLE   ?CODE STATUS: Full ?DIET: resume prior home diet   ? ?Brief Hospitalization Summary: ?Please see all hospital notes, images, labs for full details of the hospitalization. ?61 year old female with chronic pain issues on chronic opioid medications brought in by EMS when's fianc? found her obtunded lying on the couch earlier this morning.  Patient was given naloxone in the ED with some improvement in mentation.  Patient has been taking medications for muscle spasms and chronic pain and anxiety and also taking gabapentin for neuropathic pain symptoms.  Patient had been complaining of burning with urination since yesterday.  Patient also had denied fever and chills.  Patient denied nausea vomiting and diarrhea.  PDMP database shows recent prescription for OxyContin and gabapentin.  Her urinalysis was positive for infection today.  She was hypothermic.  She was severely dehydrated with an acute kidney injury and was placed on a Bair hugger for hypothermia treatment.  Sepsis protocol was initiated.  She had a lactic acidosis treated with IV fluid hydration.  She also had a leukocytosis.  Her acetaminophen level was less than 10.  Her glucose was 117.  CT head did not show any acute findings.  Chest x-ray with no acute findings.  Blood cultures x2 obtained and admission was requested for further evaluation and management of sepsis. ? ?08/24/2021:  Pt  reports feeling much better, wants to go home, creatinine down to 1.3 after IV fluids, she is eating and drinking well with no difficulties and mentation has returned back to baseline.  DC home today.   ? ?Hospital Course by problem list ? ?Assessment and Plan: ?* Hypothermia ?-- resolved now.  ? ?AKI (acute kidney injury) (HCC) ?-- Treated with aggressive IV fluid hydration and now creatinine nearly back to normal.  ? ?Diastolic heart failure (HCC) ?-- Stable and compensated ?-- Monitor fluid volume status while hydrating aggressively for sepsis, AKI and dehydration ? ?Leukocytosis ?-- WBC trending down with treatments ? ?Polycythemia ?-- Hold oral iron supplement at this time ?-- Hydrated with IV fluid and CBC / Hg normalized ? ? ?Sepsis secondary to UTI [facility](HCC) ?--Sepsis has resolved after treatments ?-- Follow urine culture and sensitivity testing still pending at time of discharge.  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 ?COPD (chronic obstructive pulmonary disease) (HCC) ?-- Stable no active symptoms at this time resume home bronchodilators ? ?Gastroesophageal reflux disease ?-- Protonix ordered for GI protection ? ?Problems influencing health status ?-- Patient appears to be overmedicated with opioids and sedating medications ?-- Recommend holding or reducing all sedating medications at this time ?--Pt improved markedly after naloxone was given in ED ? ?Neurogenic pain ?---  AKI resolving now, can resume gabapentin as tolerated. ? ? ?Chronic anticoagulation (Plavix) ?-- Resume home medications ? ?Chronic knee pain (Tertiary Area of Pain)  (Bilateral) (L>R) ?--outpatient follow up  ? ?Chronic low back pain (Primary Area of Pain) (Bilateral) (R>L) ?-- Recommended topical treatments as needed to avoid systemic opioids ? ?Opiate use ?-- Discussed with patient that she is overmedicated with opioids at this time and we have recommended reducing opioids by 50% ? ?Osteoarthritis ?-- outpatient followup ? ?History of MI (myocardial infarction) ?-- Resume home cardiac medications ? ?Essential hypertension ?-- Restarted home medications, monitor closely ? ?Dyslipidemia ?-- Resume home atorvastatin every evening ? ?Chronic pain syndrome ?-- As noted above we have reduced opioid by 50% given altered mentation and encephalopathy ? ?Anxiety, generalized ?--resume home therapy ? ?Discharge Diagnoses:  ?Principal Problem: ?  Hypothermia ?Active Problems: ?  AKI (acute kidney injury) (HCC) ?  Anxiety, generalized ?  Chronic pain syndrome ?  Dyslipidemia ?  Essential hypertension ?  History of MI (myocardial infarction) ?  Osteoarthritis ?  Opiate use ?  Chronic low back pain (Primary Area of Pain) (Bilateral) (R>L) ?  Chronic knee pain (Tertiary Area of Pain) (Bilateral) (L>R) ?  Chronic anticoagulation (Plavix) ?  Neurogenic pain ?  Antiplatelet or antithrombotic long-term use ?  Problems influencing health status ?  Gastroesophageal reflux disease ?  COPD (chronic obstructive pulmonary disease) (HCC) ?  Sepsis secondary to UTI [facility]) ?  Polycythemia ?  Leukocytosis ?  Diastolic heart failure (HCC) ? ? ?Discharge Instructions: ? ?Allergies as of 08/24/2021   ? ?   Reactions  ? Amoxicillin-pot Clavulanate Anaphylaxis, Other (See Comments)  ? N/V/D  ? Seasonal Ic [cholestatin]   ? Per pt not allergic to medication-has seasonal allergies  ? ?  ? ?  ?Medication List  ?  ? ?STOP taking these medications   ? ?furosemide 20 MG tablet ?Commonly known as: LASIX ?  ?potassium chloride SA 20 MEQ tablet ?Commonly known as: KLOR-CON M ?  ? ?  ? ?TAKE these medications    ? ?albuterol 108 (90 Base) MCG/ACT inhaler ?Commonly known as: VENTOLIN HFA ?Inhale into the lungs. ?  ?atorvastatin 80 MG tablet ?Commonly known as: LIPITOR ?Take 80 mg by mouth daily. ?  ?baclofen 10 MG tablet ?Commonly known as: LIORESAL ?Take 10 mg by mouth 3 (three) times daily as needed. ?  ?budesonide-formoterol 160-4.5 MCG/ACT inhaler ?Commonly known as: Symbicort ?Inhale 1 puff into the lungs in the morning and at bedtime. ?  ?carvedilol 12.5 MG tablet ?Commonly known as: COREG ?Take 0.5 tablets (6.25 mg total) by mouth 2 (two) times daily. ?Start taking on: August 25, 2021 ?What changed: how much to take ?  ?clonazePAM 0.5 MG tablet ?Commonly known as: KLONOPIN ?Take 0.5 mg by mouth daily as needed for anxiety. ?  ?clopidogrel 75 MG tablet ?Commonly known as: PLAVIX ?Take 1 tablet (75 mg total) by mouth daily. ?  ?dicyclomine 10 MG capsule ?Commonly known as: BENTYL ?Take 10 mg by mouth 3 (three) times daily as needed for spasms. ?  ?ferrous gluconate 324 MG tablet ?Commonly known as: FERGON ?Take 1 tablet by mouth daily. ?  ?fluticasone 50 MCG/ACT nasal spray ?Commonly known as: FLONASE ?Place 1 spray into both nostrils daily. ?  ?gabapentin 300 MG capsule ?Commonly known as: NEURONTIN ?Take 2 capsules by mouth 2 (two) times daily. ?  ?levothyroxine 75 MCG tablet ?Commonly known as: SYNTHROID ?Take 75 mcg by mouth daily. ?  ?losartan  100 MG tablet ?Commonly known as: COZAAR ?Take 0.5 tablets (50 mg total) by mouth daily. ?Start taking on: August 26, 2021 ?What changed:  ?how much to take ?These instructions start on August 26, 2021. If you are unsure what to do until then, ask your doctor or other care provider. ?  ?nitrofurantoin (macrocrystal-monohydrate) 100 MG capsule ?Commonly known as: Macrobid ?Take 1 capsule (100 mg total) by mouth 2 (two) times daily for 3 days. ?  ?nitroGLYCERIN 0.4 MG SL tablet ?Commonly known as: NITROSTAT ?See admin instructions. ?  ?omeprazole 20 MG capsule ?Commonly known as:  PRILOSEC ?Take 1 capsule by mouth 2 (two) times daily. ?  ?oxybutynin 5 MG 24 hr tablet ?Commonly known as: DITROPAN-XL ?Take 5 mg by mouth daily. ?  ?OxyCONTIN 15 mg 12 hr tablet ?Generic drug: oxyCODONE ?Take 15 mg b

## 2021-08-25 ENCOUNTER — Encounter: Payer: Self-pay | Admitting: Family Medicine

## 2021-08-25 ENCOUNTER — Other Ambulatory Visit: Payer: Self-pay | Admitting: Family Medicine

## 2021-08-25 LAB — URINE CULTURE: Culture: >=100000 — AB

## 2021-08-25 MED ORDER — CIPROFLOXACIN HCL 500 MG PO TABS: 500.0000 mg | ORAL_TABLET | Freq: Two times a day (BID) | ORAL | 0 refills | Status: AC

## 2021-08-25 NOTE — Progress Notes (Signed)
I called patient with prescription change to ciprofloxacin and sent Rx to her pharmacy and she said she will pick it up today.  Jessica Manuel, MD

## 2021-08-25 NOTE — Progress Notes (Signed)
08/25/2021 ?7:28 AM ? ? ?Urine culture back ID [email or web address], changing antibiotic to ciprofloxacin 500 mg BID, called and informed patient to pick up new prescription.  ? ?C. Laural Benes, MD  ?

## 2021-08-29 LAB — CULTURE, BLOOD (ROUTINE X 2)
Culture: NO GROWTH
Culture: NO GROWTH
Special Requests: ADEQUATE

## 2021-09-15 ENCOUNTER — Ambulatory Visit (HOSPITAL_COMMUNITY): Payer: Medicare Other

## 2021-09-19 ENCOUNTER — Emergency Department (HOSPITAL_COMMUNITY): Payer: Medicare Other

## 2021-09-19 ENCOUNTER — Inpatient Hospital Stay (HOSPITAL_COMMUNITY)
Admission: EM | Admit: 2021-09-19 | Discharge: 2021-09-21 | DRG: 071 | Discharge status: Against Medical Advice | Payer: Medicare Other | Attending: Family Medicine | Admitting: Family Medicine

## 2021-09-19 ENCOUNTER — Other Ambulatory Visit: Payer: Self-pay

## 2021-09-19 ENCOUNTER — Inpatient Hospital Stay: Payer: Self-pay

## 2021-09-19 ENCOUNTER — Encounter (HOSPITAL_COMMUNITY): Payer: Self-pay | Admitting: Emergency Medicine

## 2021-09-19 DIAGNOSIS — R0682 Tachypnea, not elsewhere classified: Secondary | ICD-10-CM | POA: Diagnosis present

## 2021-09-19 DIAGNOSIS — Z87440 Personal history of urinary (tract) infections: Secondary | ICD-10-CM

## 2021-09-19 DIAGNOSIS — Z5329 Procedure and treatment not carried out because of patient's decision for other reasons: Secondary | ICD-10-CM | POA: Diagnosis not present

## 2021-09-19 DIAGNOSIS — T465X6A Underdosing of other antihypertensive drugs, initial encounter: Secondary | ICD-10-CM | POA: Diagnosis present

## 2021-09-19 DIAGNOSIS — Z9861 Coronary angioplasty status: Secondary | ICD-10-CM

## 2021-09-19 DIAGNOSIS — Z881 Allergy status to other antibiotic agents status: Secondary | ICD-10-CM | POA: Diagnosis not present

## 2021-09-19 DIAGNOSIS — R6510 Systemic inflammatory response syndrome (SIRS) of non-infectious origin without acute organ dysfunction: Secondary | ICD-10-CM | POA: Diagnosis present

## 2021-09-19 DIAGNOSIS — F430 Acute stress reaction: Secondary | ICD-10-CM | POA: Diagnosis not present

## 2021-09-19 DIAGNOSIS — Z7951 Long term (current) use of inhaled steroids: Secondary | ICD-10-CM

## 2021-09-19 DIAGNOSIS — I6783 Posterior reversible encephalopathy syndrome: Secondary | ICD-10-CM | POA: Diagnosis present

## 2021-09-19 DIAGNOSIS — D72829 Elevated white blood cell count, unspecified: Secondary | ICD-10-CM | POA: Diagnosis present

## 2021-09-19 DIAGNOSIS — R451 Restlessness and agitation: Secondary | ICD-10-CM | POA: Diagnosis present

## 2021-09-19 DIAGNOSIS — Z888 Allergy status to other drugs, medicaments and biological substances status: Secondary | ICD-10-CM

## 2021-09-19 DIAGNOSIS — R509 Fever, unspecified: Secondary | ICD-10-CM | POA: Diagnosis present

## 2021-09-19 DIAGNOSIS — G9341 Metabolic encephalopathy: Secondary | ICD-10-CM | POA: Diagnosis present

## 2021-09-19 DIAGNOSIS — I10 Essential (primary) hypertension: Secondary | ICD-10-CM | POA: Diagnosis present

## 2021-09-19 DIAGNOSIS — Z7902 Long term (current) use of antithrombotics/antiplatelets: Secondary | ICD-10-CM

## 2021-09-19 DIAGNOSIS — Z720 Tobacco use: Secondary | ICD-10-CM | POA: Diagnosis not present

## 2021-09-19 DIAGNOSIS — G894 Chronic pain syndrome: Secondary | ICD-10-CM | POA: Diagnosis present

## 2021-09-19 DIAGNOSIS — Z800 Family history of malignant neoplasm of digestive organs: Secondary | ICD-10-CM

## 2021-09-19 DIAGNOSIS — J449 Chronic obstructive pulmonary disease, unspecified: Secondary | ICD-10-CM | POA: Diagnosis present

## 2021-09-19 DIAGNOSIS — K219 Gastro-esophageal reflux disease without esophagitis: Secondary | ICD-10-CM | POA: Diagnosis present

## 2021-09-19 DIAGNOSIS — I2510 Atherosclerotic heart disease of native coronary artery without angina pectoris: Secondary | ICD-10-CM | POA: Diagnosis present

## 2021-09-19 DIAGNOSIS — G9340 Encephalopathy, unspecified: Principal | ICD-10-CM

## 2021-09-19 DIAGNOSIS — E785 Hyperlipidemia, unspecified: Secondary | ICD-10-CM | POA: Diagnosis present

## 2021-09-19 DIAGNOSIS — R4182 Altered mental status, unspecified: Secondary | ICD-10-CM

## 2021-09-19 DIAGNOSIS — Z91148 Patient's other noncompliance with medication regimen for other reason: Secondary | ICD-10-CM | POA: Diagnosis not present

## 2021-09-19 DIAGNOSIS — Z8249 Family history of ischemic heart disease and other diseases of the circulatory system: Secondary | ICD-10-CM

## 2021-09-19 DIAGNOSIS — F17210 Nicotine dependence, cigarettes, uncomplicated: Secondary | ICD-10-CM | POA: Diagnosis present

## 2021-09-19 DIAGNOSIS — Z79899 Other long term (current) drug therapy: Secondary | ICD-10-CM

## 2021-09-19 DIAGNOSIS — I5032 Chronic diastolic (congestive) heart failure: Secondary | ICD-10-CM | POA: Diagnosis present

## 2021-09-19 DIAGNOSIS — I110 Hypertensive heart disease with heart failure: Secondary | ICD-10-CM | POA: Diagnosis present

## 2021-09-19 DIAGNOSIS — I639 Cerebral infarction, unspecified: Secondary | ICD-10-CM | POA: Diagnosis not present

## 2021-09-19 DIAGNOSIS — F419 Anxiety disorder, unspecified: Secondary | ICD-10-CM | POA: Diagnosis present

## 2021-09-19 DIAGNOSIS — F1120 Opioid dependence, uncomplicated: Secondary | ICD-10-CM | POA: Diagnosis present

## 2021-09-19 DIAGNOSIS — Z79890 Hormone replacement therapy (postmenopausal): Secondary | ICD-10-CM

## 2021-09-19 LAB — CBC WITH DIFFERENTIAL/PLATELET
Abs Immature Granulocytes: 0.04 10*3/uL (ref 0.00–0.07)
Basophils Absolute: 0.1 10*3/uL (ref 0.0–0.1)
Basophils Relative: 0 %
Eosinophils Absolute: 0 10*3/uL (ref 0.0–0.5)
Eosinophils Relative: 0 %
HCT: 47.7 % — ABNORMAL HIGH (ref 36.0–46.0)
Hemoglobin: 16.2 g/dL — ABNORMAL HIGH (ref 12.0–15.0)
Immature Granulocytes: 0 %
Lymphocytes Relative: 9 %
Lymphs Abs: 1.3 10*3/uL (ref 0.7–4.0)
MCH: 29.2 pg (ref 26.0–34.0)
MCHC: 34 g/dL (ref 30.0–36.0)
MCV: 86.1 fL (ref 80.0–100.0)
Monocytes Absolute: 0.7 10*3/uL (ref 0.1–1.0)
Monocytes Relative: 5 %
Neutro Abs: 12.1 10*3/uL — ABNORMAL HIGH (ref 1.7–7.7)
Neutrophils Relative %: 86 %
Platelets: 320 10*3/uL (ref 150–400)
RBC: 5.54 MIL/uL — ABNORMAL HIGH (ref 3.87–5.11)
RDW: 15.2 % (ref 11.5–15.5)
WBC: 14.2 10*3/uL — ABNORMAL HIGH (ref 4.0–10.5)
nRBC: 0 % (ref 0.0–0.2)

## 2021-09-19 LAB — COMPREHENSIVE METABOLIC PANEL
ALT: 19 U/L (ref 0–44)
AST: 26 U/L (ref 15–41)
Albumin: 4.2 g/dL (ref 3.5–5.0)
Alkaline Phosphatase: 98 U/L (ref 38–126)
Anion gap: 14 (ref 5–15)
BUN: 9 mg/dL (ref 8–23)
CO2: 22 mmol/L (ref 22–32)
Calcium: 9.4 mg/dL (ref 8.9–10.3)
Chloride: 104 mmol/L (ref 98–111)
Creatinine, Ser: 0.76 mg/dL (ref 0.44–1.00)
GFR, Estimated: >60 mL/min (ref >60)
Glucose, Bld: 138 mg/dL — ABNORMAL HIGH (ref 70–99)
Potassium: 3.3 mmol/L — ABNORMAL LOW (ref 3.5–5.1)
Sodium: 140 mmol/L (ref 135–145)
Total Bilirubin: 0.7 mg/dL (ref 0.3–1.2)
Total Protein: 7.7 g/dL (ref 6.5–8.1)

## 2021-09-19 LAB — AMMONIA
Ammonia: 32 umol/L (ref 9–35)
Ammonia: 14 umol/L (ref 9–35)

## 2021-09-19 LAB — BASIC METABOLIC PANEL
Anion gap: 14 (ref 5–15)
BUN: 13 mg/dL (ref 8–23)
CO2: 21 mmol/L — ABNORMAL LOW (ref 22–32)
Calcium: 9.2 mg/dL (ref 8.9–10.3)
Chloride: 107 mmol/L (ref 98–111)
Creatinine, Ser: 0.84 mg/dL (ref 0.44–1.00)
GFR, Estimated: >60 mL/min (ref >60)
Glucose, Bld: 126 mg/dL — ABNORMAL HIGH (ref 70–99)
Potassium: 2.9 mmol/L — ABNORMAL LOW (ref 3.5–5.1)
Sodium: 142 mmol/L (ref 135–145)

## 2021-09-19 LAB — URINALYSIS, ROUTINE W REFLEX MICROSCOPIC
Bacteria, UA: NONE SEEN
Bilirubin Urine: NEGATIVE
Glucose, UA: NEGATIVE mg/dL
Hgb urine dipstick: NEGATIVE
Ketones, ur: 5 mg/dL — AB
Leukocytes,Ua: NEGATIVE
Nitrite: NEGATIVE
Protein, ur: >=300 mg/dL — AB
Specific Gravity, Urine: 1.017 (ref 1.005–1.030)
pH: 8 (ref 5.0–8.0)

## 2021-09-19 LAB — MRSA NEXT GEN BY PCR, NASAL: MRSA by PCR Next Gen: NOT DETECTED

## 2021-09-19 LAB — BLOOD GAS, VENOUS
Acid-Base Excess: 2.9 mmol/L — ABNORMAL HIGH (ref 0.0–2.0)
Bicarbonate: 28.5 mmol/L — ABNORMAL HIGH (ref 20.0–28.0)
Drawn by: 6352
FIO2: 21 %
O2 Saturation: 24.1 %
Patient temperature: 38.1
pCO2, Ven: 48 mmHg (ref 44–60)
pH, Ven: 7.38 (ref 7.25–7.43)
pO2, Ven: <31 mmHg — CL (ref 32–45)

## 2021-09-19 LAB — T4, FREE
Free T4: 1 ng/dL (ref 0.61–1.12)
Free T4: 1.13 ng/dL — ABNORMAL HIGH (ref 0.61–1.12)

## 2021-09-19 LAB — RAPID URINE DRUG SCREEN, HOSP PERFORMED
Amphetamines: NOT DETECTED
Barbiturates: NOT DETECTED
Benzodiazepines: NOT DETECTED
Cocaine: NOT DETECTED
Opiates: POSITIVE — AB
Tetrahydrocannabinol: NOT DETECTED

## 2021-09-19 LAB — LIPASE, BLOOD: Lipase: 21 U/L (ref 11–51)

## 2021-09-19 LAB — CBG MONITORING, ED: Glucose-Capillary: 149 mg/dL — ABNORMAL HIGH (ref 70–99)

## 2021-09-19 LAB — TSH
TSH: 3 u[IU]/mL (ref 0.350–4.500)
TSH: 3.691 u[IU]/mL (ref 0.350–4.500)

## 2021-09-19 LAB — PROCALCITONIN: Procalcitonin: <0.1 ng/mL

## 2021-09-19 LAB — MAGNESIUM
Magnesium: 1.6 mg/dL — ABNORMAL LOW (ref 1.7–2.4)
Magnesium: 1.5 mg/dL — ABNORMAL LOW (ref 1.7–2.4)

## 2021-09-19 LAB — FOLATE: Folate: 35 ng/mL (ref >5.9)

## 2021-09-19 LAB — LACTIC ACID, PLASMA: Lactic Acid, Venous: 2.9 mmol/L (ref 0.5–1.9)

## 2021-09-19 LAB — ETHANOL: Alcohol, Ethyl (B): <10 mg/dL (ref <10)

## 2021-09-19 LAB — VITAMIN B12: Vitamin B-12: 374 pg/mL (ref 180–914)

## 2021-09-19 IMAGING — CT CT HEAD W/O CM
2 of 4 series · 12 of 47 positions shown, 15 images · non-contrast
Comparison: CT head [DATE]

CLINICAL DATA: Combative, altered mental status, recent diagnosis
of UTI



[Series 3: head ax w o · axial · 0.31mm/px · z∈[+8,+138]mm · 9 of 32 slices shown, 12 images]
[im 3/32  brain]
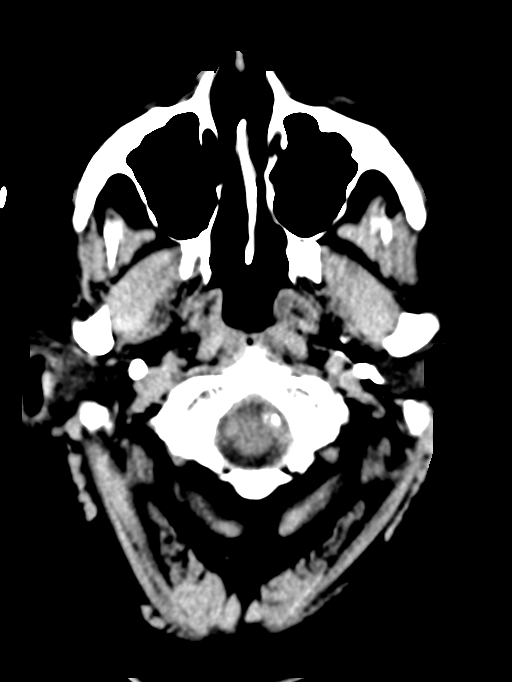
[im 3/32  bone]
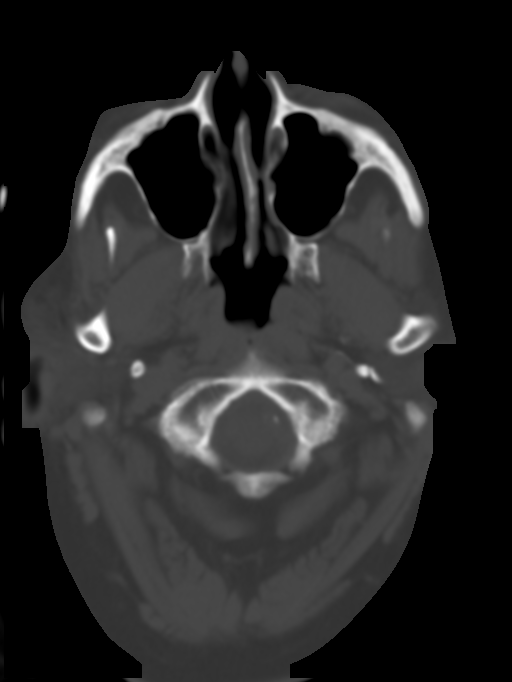
[im 7/32  brain]
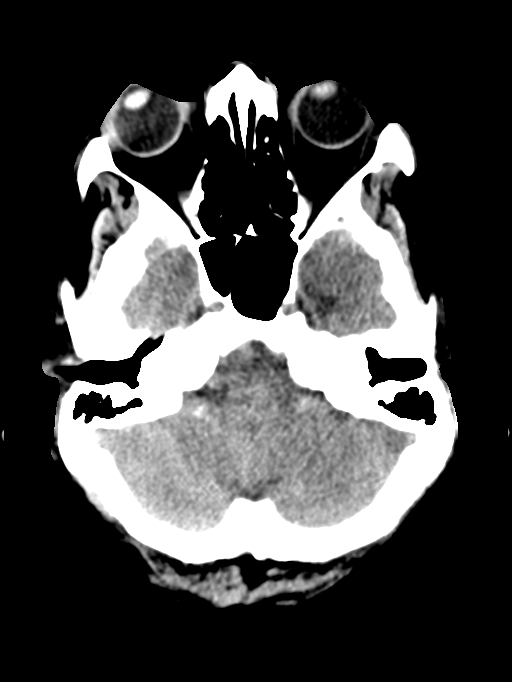
[im 9/32  brain]
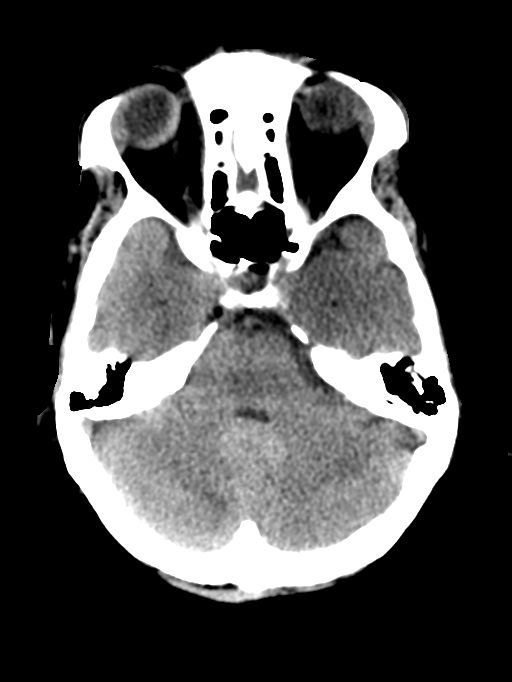
[im 14/32  brain]
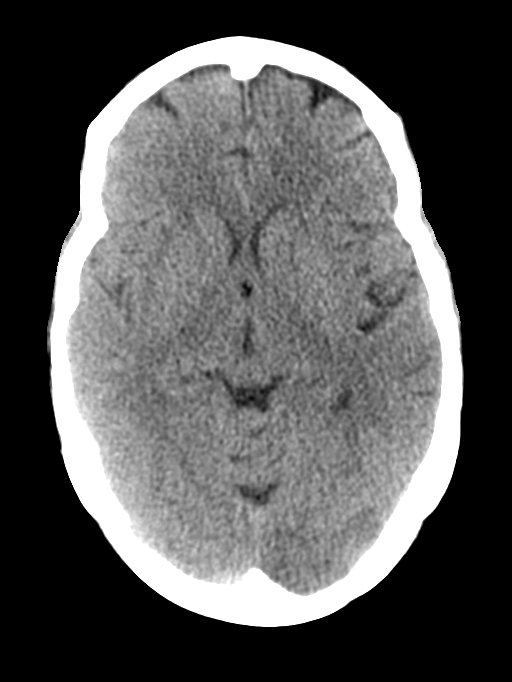
[im 16/32  brain]
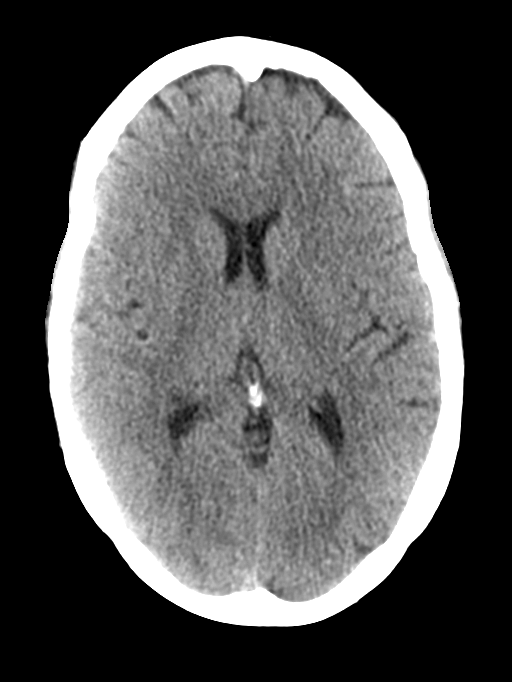
[im 16/32  bone]
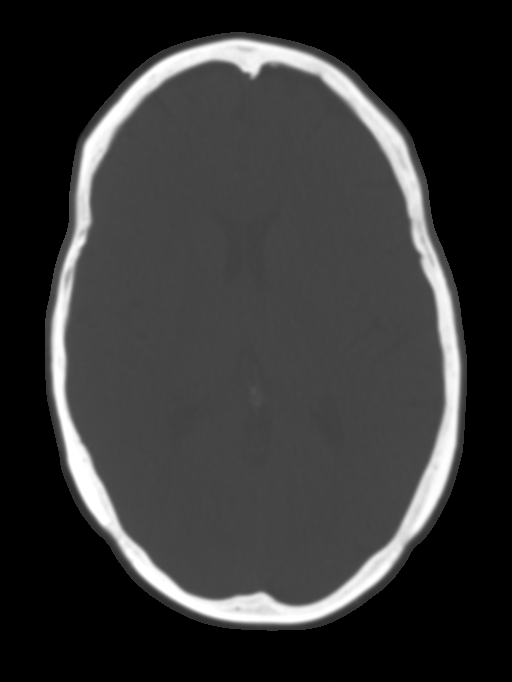
[im 18/32  brain]
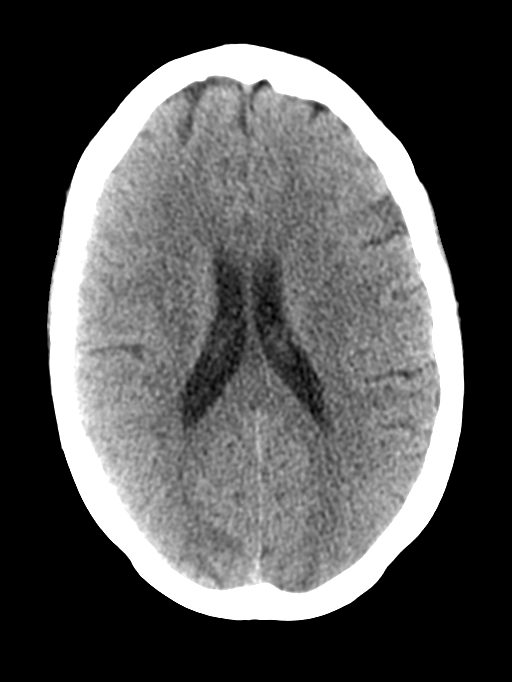
[im 23/32  brain]
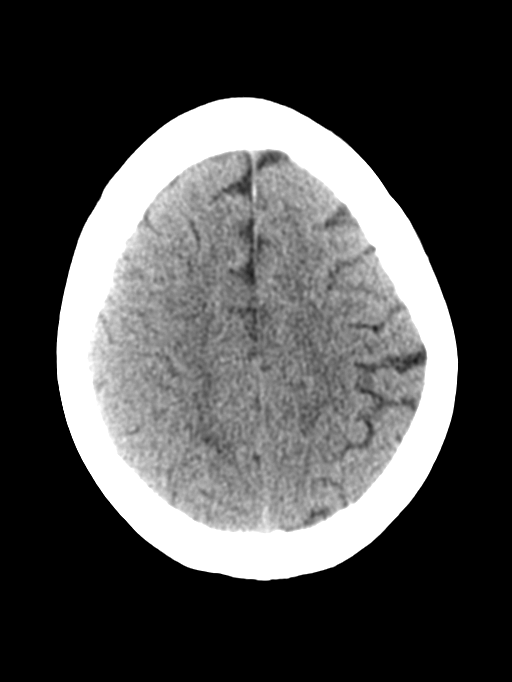
[im 25/32  brain]
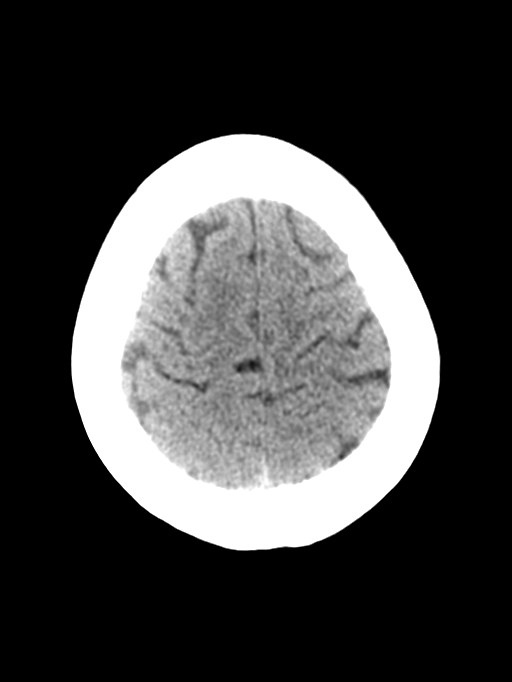
[im 29/32  brain]
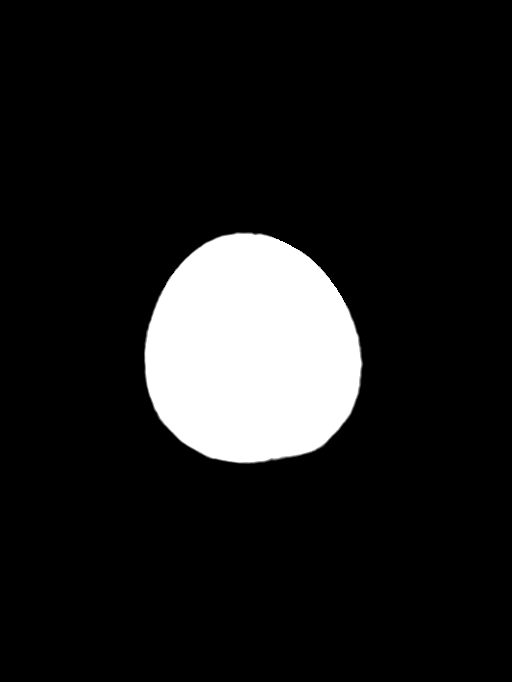
[im 29/32  bone]
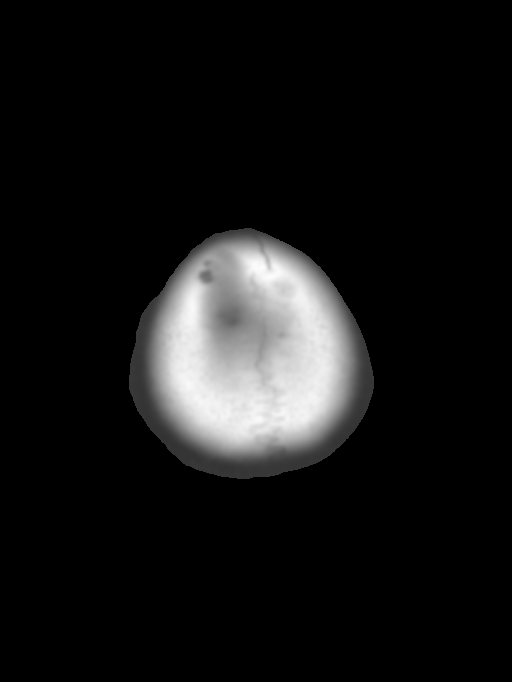

[Series 5: coronal soft · coronal · 0.29mm/px · 3 of 67 slices shown]
[im 23/67  brain]
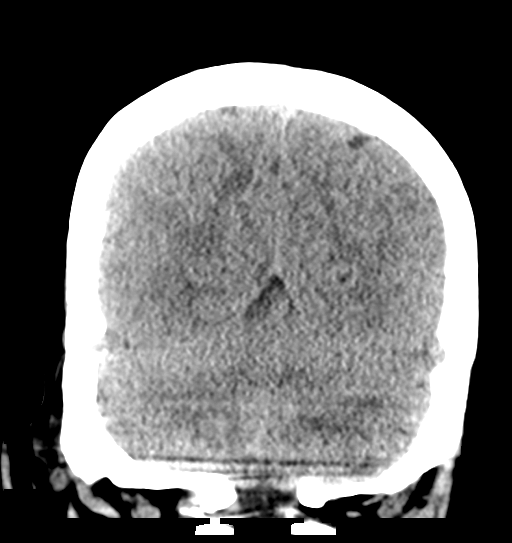
[im 30/67  brain]
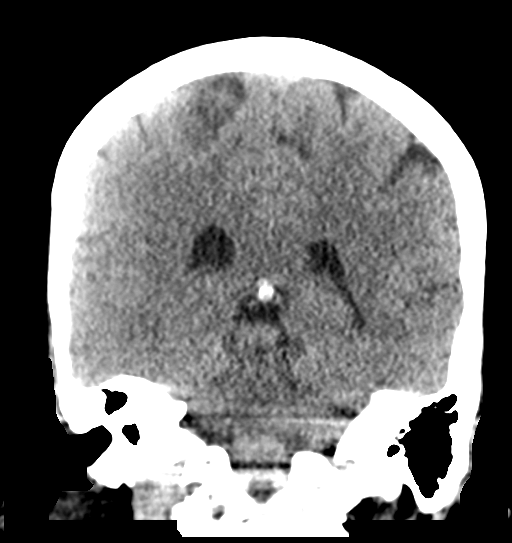
[im 37/67  brain]
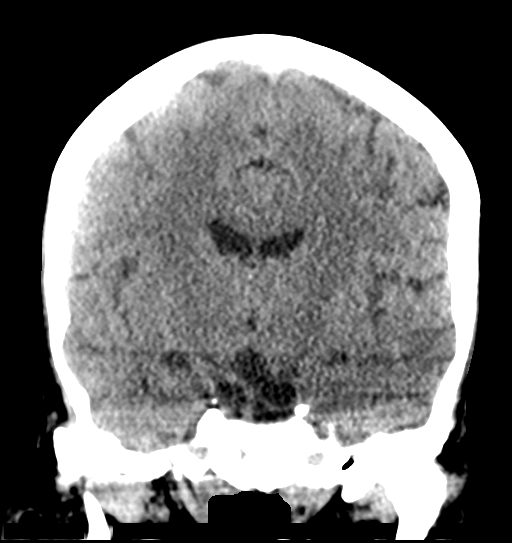

[12 of 47 positions shown; findings below may reference images not displayed]

FINDINGS: Brain: There is hypodensity in the bilateral parieto-occipital
regions and right cerebellar hemisphere with the appearance raising
suspicion for vasogenic edema not seen on the study from [DATE]
(6-33, 6-24, 3-24). There is no significant mass effect.

There is no acute intracranial hemorrhage or extra-axial fluid
collection. Parenchymal volume is normal. The ventricles are normal
in size. There is no midline shift.

Vascular: No hyperdense vessel or unexpected calcification.

Skull: Normal. Negative for fracture or focal lesion.

Sinuses/Orbits: The imaged paranasal sinuses are clear. The globes
and orbits are unremarkable.

Other: There is a perforated nasal septum.
IMPRESSION: 1. New suspected vasogenic edema in the bilateral parieto-occipital
regions and right cerebellar hemisphere. Recommend brain MRI with
and without contrast for further evaluation.
2. Perforated nasal septum.

## 2021-09-19 IMAGING — MR MR HEAD W/O CM
8 series · 48 of 48 positions shown · non-contrast
Comparison: CT head from the same day.

CLINICAL DATA: Neuro deficit, acute, stroke suspected

EXAM:
MRI HEAD WITHOUT CONTRAST
TECHNIQUE: Multiplanar, multiecho pulse sequences of the brain and surrounding
structures were obtained without intravenous contrast.

[Series 13: DWI · axial · 4.0mm · 0.88mm/px · z∈[-102,+36]mm · 8 of 36 slices shown (1 of 6)]
[im 1/36]
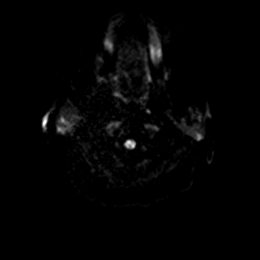
[im 6/36]
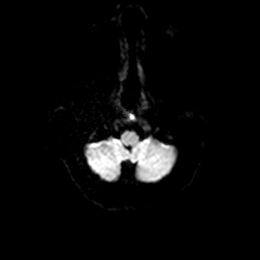
[im 11/36]
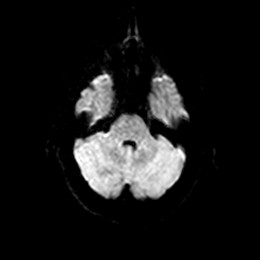
[im 16/36]
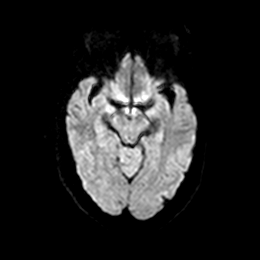
[im 21/36]
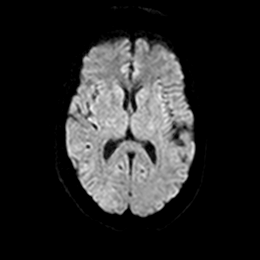
[im 26/36]
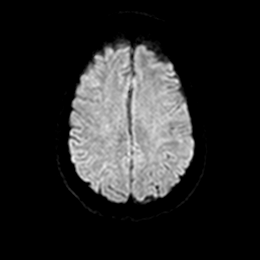
[im 31/36]
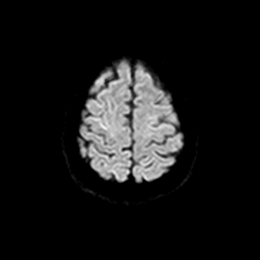
[im 36/36]
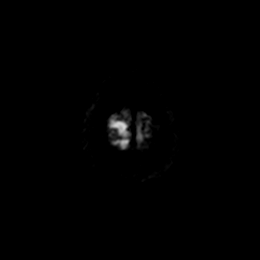

[Series 13: DWI · axial · 4.0mm · 0.88mm/px · z∈[-102,+36]mm · 7 of 36 slices shown (2 of 6)]
[im 1/36]
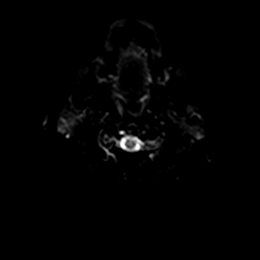
[im 6/36]
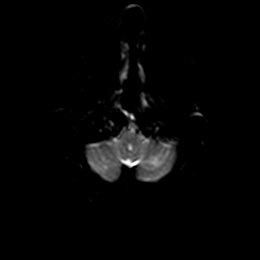
[im 12/36]
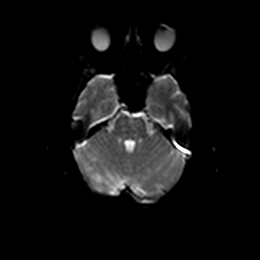
[im 18/36]
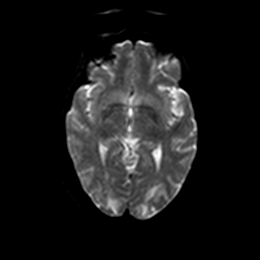
[im 24/36]
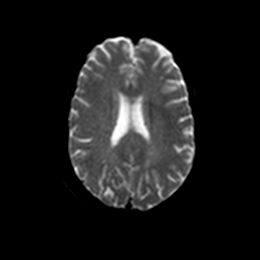
[im 30/36]
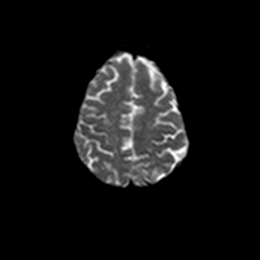
[im 36/36]
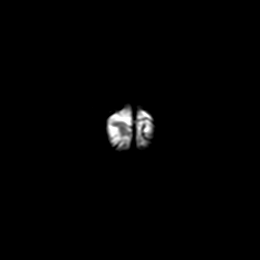

[Series 14: DWI · axial · 4.0mm · 0.88mm/px · z∈[-102,+36]mm · 7 of 36 slices shown (3 of 6)]
[im 1/36]
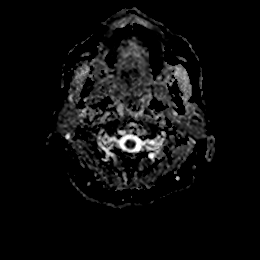
[im 6/36]
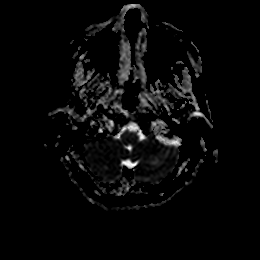
[im 12/36]
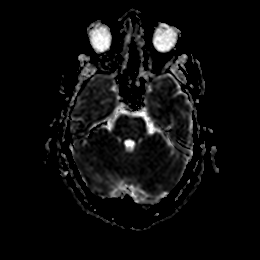
[im 18/36]
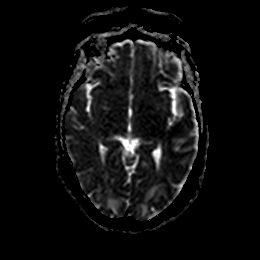
[im 24/36]
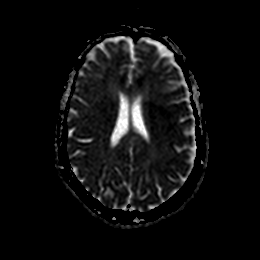
[im 30/36]
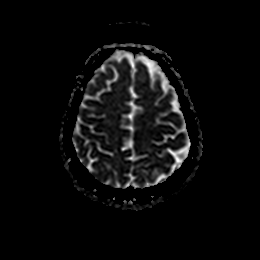
[im 36/36]
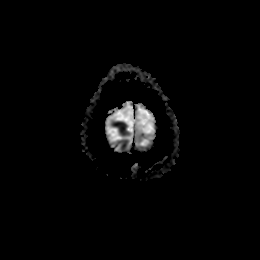

[Series 15: DWI · coronal · 5.0mm · 0.88mm/px · 6 of 28 slices shown (4 of 6)]
[im 1/28]
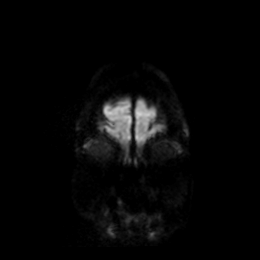
[im 6/28]
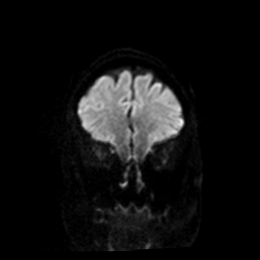
[im 11/28]
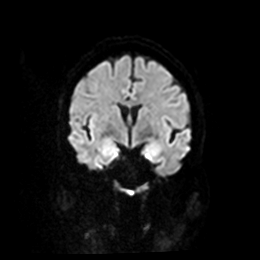
[im 17/28]
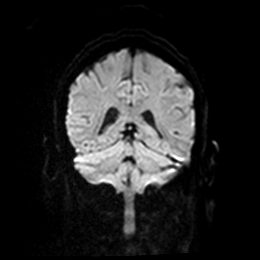
[im 22/28]
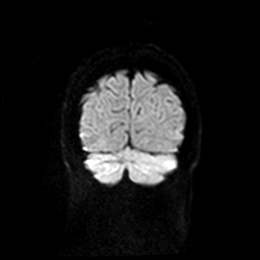
[im 28/28]
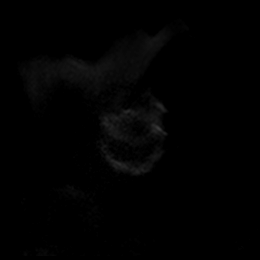

[Series 15: DWI · coronal · 5.0mm · 0.88mm/px · 6 of 28 slices shown (5 of 6)]
[im 1/28]
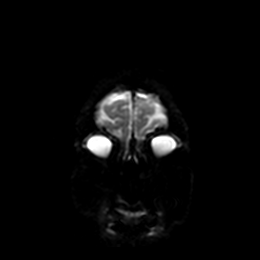
[im 6/28]
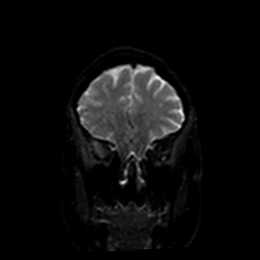
[im 11/28]
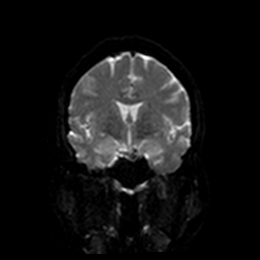
[im 17/28]
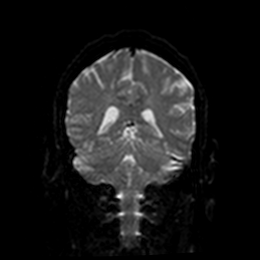
[im 22/28]
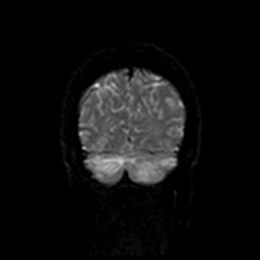
[im 28/28]
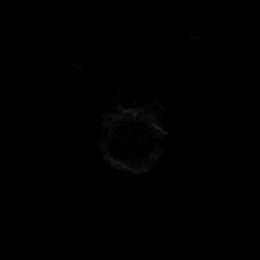

[Series 16: DWI · coronal · 5.0mm · 0.88mm/px · 6 of 28 slices shown (6 of 6)]
[im 1/28]
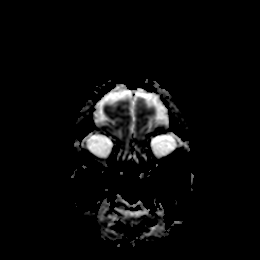
[im 6/28]
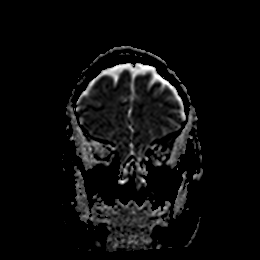
[im 11/28]
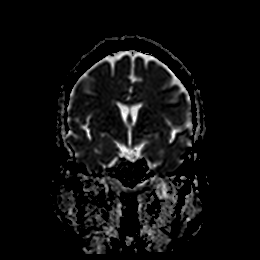
[im 17/28]
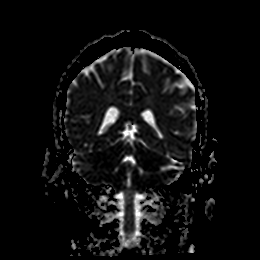
[im 22/28]
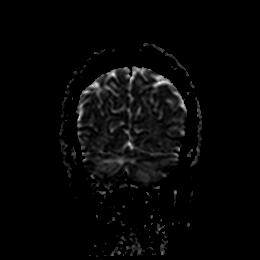
[im 28/28]
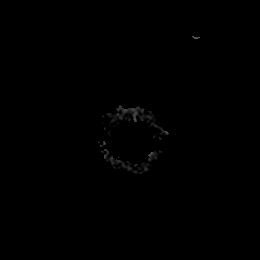

[Series 17: T1 · sagittal · 5.0mm · 0.94mm/px · 4 of 21 slices shown]
[im 1/21]
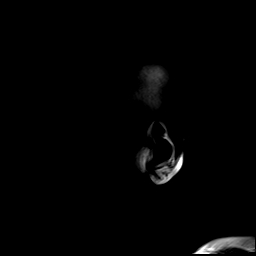
[im 7/21]
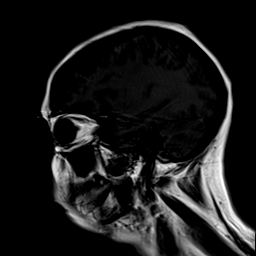
[im 14/21]
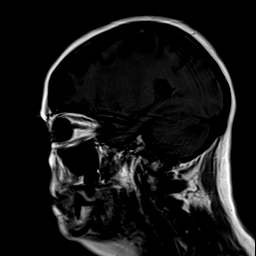
[im 21/21]
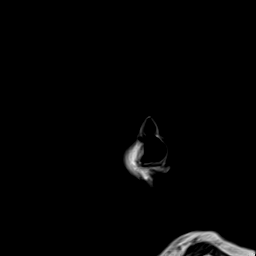

[Series 18: T2 · axial · 5.0mm · 0.72mm/px · z∈[-95,+37]mm · 4 of 20 slices shown]
[im 1/20]
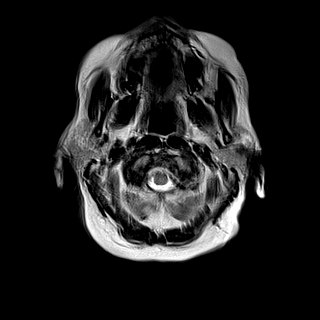
[im 7/20]
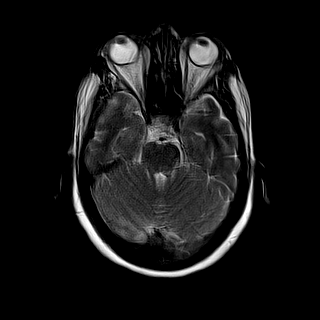
[im 13/20]
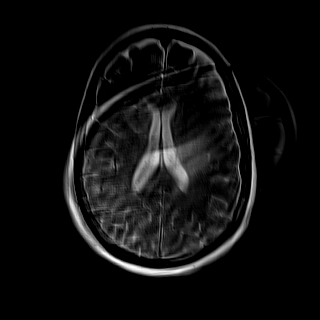
[im 20/20]
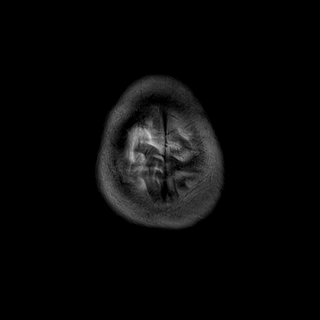

[48 of 48 positions shown; findings below may reference images not displayed]

FINDINGS: Motion limited and incomplete exam due to patient intolerance. Only
DWI/ADC, sagittal T1 and axial T2 sequences performed.

Brain: Abnormal T2 hyperintensity in bilateral cortical and
subcortical parieto-occipital regions and bilateral cerebellum,
compatible with edema. A few areas of restricted diffusion within
the right cerebellum (for example see series 13, image 10). No
obvious evidence of hydrocephalus, mass lesion, midline shift,
extra-axial fluid collection, or acute hemorrhage.

Vascular: Major arterial flow voids appear maintained at the skull
base.

Skull and upper cervical spine: Normal marrow signal.

Sinuses/Orbits: Clear sinuses.  No acute orbital findings.

Other: No mastoid effusions.
IMPRESSION: 1. Motion limited and incomplete exam due to patient intolerance.
Abnormal edema in bilateral cortical and subcortical
parieto-occipital regions and bilateral cerebellar hemispheres.
Findings are incompletely assessed, but suspicious for posterior
reversible encephalopathy syndrome (PRES). A follow-up MRI is
recommended when the patient is able (possibly with sedation) to
further assess and evaluate for other etiologies.
2. A few small foci of restricted diffusion in the right cerebellum,
which could be related to the above and/or superimposed small acute
infarcts.

## 2021-09-19 MED ORDER — DEXMEDETOMIDINE HCL IN NACL 400 MCG/100ML IV SOLN
INTRAVENOUS | Status: AC
  Administered 2021-09-19: 1 ug/kg/h via INTRAVENOUS
  Filled 2021-09-19: qty 100

## 2021-09-19 MED ORDER — LABETALOL HCL 5 MG/ML IV SOLN: 10.0000 mg | INTRAVENOUS | Status: DC | PRN

## 2021-09-19 MED ORDER — VANCOMYCIN HCL 1500 MG/300ML IV SOLN
1500.0000 mg | INTRAVENOUS | Status: DC
  Administered 2021-09-19: 1500 mg via INTRAVENOUS
  Filled 2021-09-19: qty 300

## 2021-09-19 MED ORDER — LEVOTHYROXINE SODIUM 100 MCG PO TABS
100.0000 ug | ORAL_TABLET | Freq: Every day | ORAL | Status: DC
  Administered 2021-09-21: 100 ug via ORAL
  Filled 2021-09-19: qty 1

## 2021-09-19 MED ORDER — LORAZEPAM 2 MG/ML IJ SOLN
INTRAMUSCULAR | Status: AC
  Administered 2021-09-19: 2 mg
  Filled 2021-09-19: qty 1

## 2021-09-19 MED ORDER — CHLORHEXIDINE GLUCONATE CLOTH 2 % EX PADS
6.0000 | MEDICATED_PAD | Freq: Every day | CUTANEOUS | Status: DC
  Administered 2021-09-20 – 2021-09-21 (×2): 6 via TOPICAL

## 2021-09-19 MED ORDER — ATORVASTATIN CALCIUM 40 MG PO TABS
80.0000 mg | ORAL_TABLET | Freq: Every day | ORAL | Status: DC
  Administered 2021-09-20: 80 mg via ORAL
  Filled 2021-09-19: qty 2

## 2021-09-19 MED ORDER — DEXMEDETOMIDINE HCL IN NACL 400 MCG/100ML IV SOLN
0.4000 ug/kg/h | INTRAVENOUS | Status: DC
  Administered 2021-09-20 (×2): 0.9 ug/kg/h via INTRAVENOUS
  Administered 2021-09-20: 0.5 ug/kg/h via INTRAVENOUS
  Filled 2021-09-19 (×3): qty 100

## 2021-09-19 MED ORDER — MOMETASONE FURO-FORMOTEROL FUM 200-5 MCG/ACT IN AERO
2.0000 | INHALATION_SPRAY | Freq: Two times a day (BID) | RESPIRATORY_TRACT | Status: DC
  Administered 2021-09-20 – 2021-09-21 (×2): 2 via RESPIRATORY_TRACT
  Filled 2021-09-19: qty 8.8

## 2021-09-19 MED ORDER — HALOPERIDOL LACTATE 5 MG/ML IJ SOLN
5.0000 mg | Freq: Once | INTRAMUSCULAR | Status: AC
Start: 2021-09-19 — End: 2021-09-19

## 2021-09-19 MED ORDER — MAGNESIUM SULFATE 4 GM/100ML IV SOLN
4.0000 g | Freq: Once | INTRAVENOUS | Status: AC
Start: 2021-09-19 — End: 2021-09-20
  Administered 2021-09-19: 4 g via INTRAVENOUS
  Filled 2021-09-19: qty 100

## 2021-09-19 MED ORDER — HALOPERIDOL LACTATE 5 MG/ML IJ SOLN
INTRAMUSCULAR | Status: AC
Start: 2021-09-19 — End: 2021-09-19
  Filled 2021-09-19: qty 1

## 2021-09-19 MED ORDER — LABETALOL HCL 5 MG/ML IV SOLN: 5.0000 mg | INTRAVENOUS | Status: DC | PRN

## 2021-09-19 MED ORDER — CARVEDILOL 3.125 MG PO TABS
6.2500 mg | ORAL_TABLET | Freq: Two times a day (BID) | ORAL | Status: DC
  Administered 2021-09-20 – 2021-09-21 (×2): 6.25 mg via ORAL
  Filled 2021-09-19 (×2): qty 2

## 2021-09-19 MED ORDER — HYDROMORPHONE HCL 1 MG/ML IJ SOLN
1.0000 mg | Freq: Once | INTRAMUSCULAR | Status: AC
  Administered 2021-09-19: 1 mg via INTRAVENOUS
  Filled 2021-09-19: qty 1

## 2021-09-19 MED ORDER — OXYCODONE HCL 5 MG PO TABS
10.0000 mg | ORAL_TABLET | Freq: Three times a day (TID) | ORAL | Status: DC | PRN
  Administered 2021-09-20: 10 mg via ORAL
  Filled 2021-09-19: qty 2

## 2021-09-19 MED ORDER — CLOPIDOGREL BISULFATE 75 MG PO TABS
75.0000 mg | ORAL_TABLET | Freq: Every day | ORAL | Status: DC
  Administered 2021-09-21: 75 mg via ORAL
  Filled 2021-09-19: qty 1

## 2021-09-19 MED ORDER — LORAZEPAM 2 MG/ML IJ SOLN
2.0000 mg | Freq: Once | INTRAMUSCULAR | Status: AC
Start: 2021-09-19 — End: 2021-09-19

## 2021-09-19 MED ORDER — SODIUM CHLORIDE 0.9 % IV BOLUS
1000.0000 mL | Freq: Once | INTRAVENOUS | Status: AC
  Administered 2021-09-19: 1000 mL via INTRAVENOUS

## 2021-09-19 MED ORDER — ONDANSETRON HCL 4 MG PO TABS: 4.0000 mg | ORAL_TABLET | Freq: Four times a day (QID) | ORAL | Status: DC | PRN

## 2021-09-19 MED ORDER — ORAL CARE MOUTH RINSE
15.0000 mL | Freq: Two times a day (BID) | OROMUCOSAL | Status: DC
  Administered 2021-09-20 – 2021-09-21 (×2): 15 mL via OROMUCOSAL

## 2021-09-19 MED ORDER — SODIUM CHLORIDE 0.9 % IV SOLN
2.0000 g | Freq: Three times a day (TID) | INTRAVENOUS | Status: DC
  Administered 2021-09-19 – 2021-09-21 (×6): 2 g via INTRAVENOUS
  Filled 2021-09-19 (×7): qty 12.5

## 2021-09-19 MED ORDER — ENOXAPARIN SODIUM 40 MG/0.4ML IJ SOSY
40.0000 mg | PREFILLED_SYRINGE | INTRAMUSCULAR | Status: DC
  Administered 2021-09-19 – 2021-09-20 (×2): 40 mg via SUBCUTANEOUS
  Filled 2021-09-19 (×2): qty 0.4

## 2021-09-19 MED ORDER — HYDRALAZINE HCL 20 MG/ML IJ SOLN
10.0000 mg | Freq: Four times a day (QID) | INTRAMUSCULAR | Status: DC | PRN
Start: 2021-09-19 — End: 2021-09-20
  Administered 2021-09-19 – 2021-09-20 (×3): 10 mg via INTRAVENOUS
  Filled 2021-09-19 (×3): qty 1

## 2021-09-19 MED ORDER — PANTOPRAZOLE SODIUM 40 MG PO TBEC
40.0000 mg | DELAYED_RELEASE_TABLET | Freq: Every day | ORAL | Status: DC
  Administered 2021-09-21: 40 mg via ORAL
  Filled 2021-09-19: qty 1

## 2021-09-19 MED ORDER — LORAZEPAM 2 MG/ML IJ SOLN
INTRAMUSCULAR | Status: AC
  Administered 2021-09-19: 2 mg via INTRAMUSCULAR
  Filled 2021-09-19: qty 1

## 2021-09-19 MED ORDER — LOSARTAN POTASSIUM 50 MG PO TABS
50.0000 mg | ORAL_TABLET | Freq: Every day | ORAL | Status: DC
  Administered 2021-09-21: 50 mg via ORAL
  Filled 2021-09-19: qty 1

## 2021-09-19 MED ORDER — HALOPERIDOL LACTATE 5 MG/ML IJ SOLN
INTRAMUSCULAR | Status: AC
  Administered 2021-09-19: 5 mg via INTRAVENOUS
  Filled 2021-09-19: qty 1

## 2021-09-19 MED ORDER — ONDANSETRON HCL 4 MG/2ML IJ SOLN: 4.0000 mg | Freq: Four times a day (QID) | INTRAMUSCULAR | Status: DC | PRN

## 2021-09-19 MED ORDER — HALOPERIDOL LACTATE 5 MG/ML IJ SOLN
2.0000 mg | Freq: Once | INTRAMUSCULAR | Status: AC
  Administered 2021-09-19: 2 mg via INTRAMUSCULAR

## 2021-09-19 MED ORDER — POTASSIUM CHLORIDE 10 MEQ/100ML IV SOLN
10.0000 meq | INTRAVENOUS | Status: AC
  Administered 2021-09-19 – 2021-09-20 (×3): 10 meq via INTRAVENOUS
  Filled 2021-09-19 (×3): qty 100

## 2021-09-19 MED ORDER — ACETAMINOPHEN 325 MG PO TABS
650.0000 mg | ORAL_TABLET | Freq: Four times a day (QID) | ORAL | Status: DC | PRN
  Administered 2021-09-20: 650 mg via ORAL
  Filled 2021-09-19: qty 2

## 2021-09-19 MED ORDER — OXYCODONE HCL ER 15 MG PO T12A: 15.0000 mg | EXTENDED_RELEASE_TABLET | Freq: Two times a day (BID) | ORAL | Status: DC

## 2021-09-19 MED ORDER — POTASSIUM CHLORIDE IN NACL 20-0.9 MEQ/L-% IV SOLN: INTRAVENOUS | Status: DC

## 2021-09-19 NOTE — Assessment & Plan Note (Addendum)
RESOLVED: patient back to her baseline mental status ?Pt discharging against medical advice.  ?Multifactorial including PRES, opioid and hypnotic medications ?-infection ruled out ?-UA negative for pyuria ?-Chest x-ray negative for infiltrates ?-Follow blood cultures: NGTD ?-B12 - 374  ?-TSH - 3.691 ?-neurology consultation appreciated ?-EEG: encephalopathy ?
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
## 2021-09-19 NOTE — Progress Notes (Signed)
Pharmacy Antibiotic Note ? ?Jessica Bell is a 61 y.o. female admitted on 09/19/2021 with sepsis.  Pharmacy has been consulted for Vancomycin and cefepime dosing. ? ?Plan: ?Vancomycin 1500 mg IV Q 24 hrs. Goal AUC 400-550. ?Expected AUC: 508 ?SCr used: 0.8 (actual 0.76)  ?Cefepime 2gm IV q8h ?F/u cxs and clinical progress ?Monitor V/S. Labs and levels as indicated ? ?Height: 5\' 3"  (160 cm) ?Weight: 74.4 kg (164 lb 0.4 oz) ?IBW/kg (Calculated) : 52.4 ? ?Temp (24hrs), Avg:100.1 ?F (37.8 ?C), Min:99.6 ?F (37.6 ?C), Max:100.6 ?F (38.1 ?C) ? ?Recent Labs  ?Lab 09/19/21 ?0708  ?WBC 14.2*  ?CREATININE 0.76  ?LATICACIDVEN 2.9*  ?  ?Estimated Creatinine Clearance: 71.3 mL/min (by C-G formula based on SCr of 0.76 mg/dL).   ? ?Allergies  ?Allergen Reactions  ? Amoxicillin-Pot Clavulanate Anaphylaxis and Other (See Comments)  ?  N/V/D  ? Seasonal Ic [Cholestatin]   ?  Per pt not allergic to medication-has seasonal allergies  ? ? ?Antimicrobials this admission: ?Vancomycin 5/9 >>  ?Cefepime 5/9 >>  ? ?Microbiology results: ?5/9 BCx: pending ? MRSA PCR:  ? ?Thank you for allowing pharmacy to be a part of this patient?s care. ? ?Isac Sarna, BS Pharm D, BCPS ?Clinical Pharmacist ?09/19/2021 4:50 PM ? ?

## 2021-09-19 NOTE — Assessment & Plan Note (Addendum)
Clinically euvolemic ?05/11/2021 echo EF 50-55%, G1DD Distal septal HK ?Restarted carvedilol ?

## 2021-09-19 NOTE — Hospital Course (Addendum)
61 year old female with a history of COPD, diastolic CHF, hypertension, opiate dependence and chronic pain syndrome, GERD, coronary artery disease, anxiety presenting with altered mental status.  The patient is unable to provide any history at this time secondary to her altered mental status.  History is obtained from speaking with the patient's fianc? at the bedside.  Apparently, the patient had been in her usual state of health until the patient was found to be minimally responsive by her fianc? around 5:30 am on 09/19/2021.  Fianc? states that the patient has been her usual self when he last saw her at 8:30 PM on 09/18/2021.  Notably, the patient was recently mated to the hospital from 08/23/2021 to 08/24/2021 with a similar presentation.  During that hospitalization, the patient had acute metabolic encephalopathy secondary to sepsis and UTI.  There is also a contribution from the patient's opioids and other hypnotic medications. Nevertheless, the patient's fianc? states that he has been administering the patient's opioids to her.  However he states that the patient administers all the other medications herself including her lorazepam and baclofen.  He states that usually when the patient takes her baclofen in addition to her other medications she becomes encephalopathic.  There have been no complaints of fevers, chills, chest pain, cough, hemoptysis, nausea, vomiting, diarrhea, abdominal pain.  The patient continues to smoke up to 1 pack/day.  Notably, spouse states patient's amlodipine was discontinued "few weeks" ago.  In the ED, the patient had a temperature of 100.6 ?F.  She was hemodynamically stable.  In fact, the patient was hypertensive up to 194/76.  Oxygen saturation was 99% on room air.  BMP showed sodium 140, potassium 3.3, bicarbonate 22, serum creatinine 0.76.  LFTs were unremarkable.  WBC 14.9, hemoglobin 16.2, platelets 220,000.  CT of the brain shows new suspected vasogenic edema in the bilateral  parietal temporal lobes and right cerebellar hemisphere.  MRI of the brain showed abnormal edema in the bilateral parieto-occipital regions with edema also in the bilateral cerebellar hemispheres.  This was suspicious for PRES. there is also a few small foci of restricted diffusion in the right cerebellum which may be related to PRES and or superimposed cerebellar infarcts.  Chest x-ray was negative for infiltrates.  EKG showed sinus rhythm and nonspecific ST-T wave changes.  UDS showed positive opiates.  Lactic acid 2.9.  The patient was given labetalol for her elevated BP.  She was also given Ativan 2 mg IM, Haldol 5 mg IV, Dilaudid 1 mg IV for her agitation. ? ?09/21/2021:  Pt says she is going home, she refused to complete stroke work up. She refused to complete the doppler US studies ordered. She is leaving against medical advice. I told her I would discharge her later after I had results of the stroke work up completed but she refused to allow them to do the [place] studies and wants to leave now and willing to sign out AMA.  She has decisional capacity and she was counseled and says her daughter is visiting her from  and she wants to go be with her now and not waiting any longer for any tests. She verbalized understanding of the risks of leaving AMA and she says she has no problem doing this.    ? ?

## 2021-09-19 NOTE — Assessment & Plan Note (Addendum)
Presented with tachypnea, leukocytosis, and fever 100.6 ?F ?Follow blood cultures:  No growth to date ?Chest x-ray negative for infiltrates ?UA negative for pyuria ?Empiric vancomycin and cefepime ?Lactic acid peaked at 2.9 ?Continue IV fluids ?DC vanc now that MRSA screen negative ?DC abx with reassuring procalcitonin  ?

## 2021-09-19 NOTE — Progress Notes (Signed)
Patient admitted to ICU room 6, combative, cursing, resisting care, arrived in restraints (verified order for restraints), bandages noted to RIGHT arm (ED nurse reported that patient returned from MRI with 3 new skin tears to RIGHT arm), disoriented  ?

## 2021-09-19 NOTE — ED Provider Notes (Signed)
[facility] EMERGENCY DEPARTMENT Provider Note   CSN: [id number] Arrival date & time: 09/19/21  2355     History  Chief Complaint  Patient presents with   Altered Mental Status    Jessica Bell is a 61 y.o. female.  HPI Patient presents with altered mental status.  She cannot provide any details of her history, level 5 caveat secondary to acuity of condition.  He has multiple medical issues including chronic pain, takes narcotics, was admitted a month ago for similar altered mental status episode.  Reportedly the patient was heard to be gurgling by her husband today, and upon checking she was minimally responsive.  With combativeness in route the patient received Ativan 2 mg x 1.    Home Medications Prior to Admission medications   Medication Sig Start Date End Date Taking? Authorizing Provider  albuterol (PROVENTIL HFA;VENTOLIN HFA) 108 (90 Base) MCG/ACT inhaler Inhale into the lungs.   Yes [provider]  atorvastatin (LIPITOR) 80 MG tablet Take 80 mg by mouth daily. 02/07/21  Yes [provider]  baclofen (LIORESAL) 10 MG tablet Take 10 mg by mouth 3 (three) times daily as needed for muscle spasms. 02/24/21  Yes [provider]  budesonide-formoterol (SYMBICORT) 160-4.5 MCG/ACT inhaler Inhale 1 puff into the lungs in the morning and at bedtime. 05/23/20  Yes Robinson, [place] N, PA-C  carvedilol (COREG) 12.5 MG tablet Take 0.5 tablets (6.25 mg total) by mouth 2 (two) times daily. 08/25/21  Yes Johnson, Clanford L, MD  clonazePAM (KLONOPIN) 0.5 MG tablet Take 0.5 mg by mouth daily as needed for anxiety. 04/26/21  Yes [provider]  clopidogrel (PLAVIX) 75 MG tablet Take 1 tablet (75 mg total) by mouth daily. 03/10/21  Yes Erick Blinks, MD  dicyclomine (BENTYL) 10 MG capsule Take 10 mg by mouth 3 (three) times daily as needed for spasms.   Yes [provider]  ferrous gluconate (FERGON) 324 MG tablet Take 1 tablet by mouth daily. 07/03/21   Yes [provider]  fluticasone (FLONASE) 50 MCG/ACT nasal spray Place 1 spray into both nostrils daily. 12/25/15  Yes [provider]  furosemide (LASIX) 20 MG tablet Take 20 mg by mouth daily as needed. 09/04/21  Yes [provider]  gabapentin (NEURONTIN) 300 MG capsule Take 2 capsules by mouth 2 (two) times daily. 04/17/21  Yes [provider]  hydrOXYzine (ATARAX) 50 MG tablet Take 50-100 mg by mouth 3 (three) times daily as needed. 08/23/21  Yes [provider]  levothyroxine (SYNTHROID, LEVOTHROID) 75 MCG tablet Take 100 mcg by mouth daily. 02/14/17  Yes [provider]  losartan (COZAAR) 100 MG tablet Take 0.5 tablets (50 mg total) by mouth daily. 08/26/21  Yes Johnson, Clanford L, MD  omeprazole (PRILOSEC) 20 MG capsule Take 1 capsule by mouth 2 (two) times daily.   Yes [provider]  Oxycodone HCl 10 MG TABS Take 0.5 tablets (5 mg total) by mouth 3 (three) times daily. Patient taking differently: Take 1 mg by mouth 3 (three) times daily as needed (breakthrough pain). 08/24/21  Yes Johnson, Clanford L, MD  OXYCONTIN 15 MG 12 hr tablet Take 15 mg by mouth 2 (two) times daily. 02/28/21  Yes [provider]  nitroGLYCERIN (NITROSTAT) 0.4 MG SL tablet Place 0.4 mg under the tongue every 5 (five) minutes as needed for chest pain.    [provider]  oxybutynin (DITROPAN-XL) 5 MG 24 hr tablet Take 5 mg by mouth daily. 02/23/21  [provider]      Allergies    Amoxicillin-pot clavulanate and Seasonal ic [cholestatin]    Review of Systems   Review of Systems  Unable to perform ROS: Acuity of condition   Physical Exam Updated Vital Signs BP (!) 146/91   Pulse 63   Temp 99.6 F (37.6 C) (Rectal)   Resp 16   Ht 5\' 3"  (1.6 m)   Wt 74.4 kg   SpO2 92%   BMI 29.06 kg/m  Physical Exam Vitals and nursing note reviewed.  Constitutional:      Appearance: She is well-developed. She is ill-appearing.      Comments: Withdrawn female, responds to painful stimuli, moves spontaneously all extremities, has no facial asymmetry, speech is brief, though individual words are intelligible.  HENT:     Head: Normocephalic and atraumatic.  Eyes:     Conjunctiva/sclera: Conjunctivae normal.  Cardiovascular:     Rate and Rhythm: Normal rate and regular rhythm.  Pulmonary:     Effort: Pulmonary effort is normal. No respiratory distress.     Breath sounds: Normal breath sounds. No stridor.  Abdominal:     General: There is no distension.  Skin:    General: Skin is warm and dry.  Neurological:     Mental Status: She is oriented to person, place, and time.     Cranial Nerves: No cranial nerve deficit.  Psychiatric:        Mood and Affect: Mood normal.    ED Results / Procedures / Treatments   Labs (all labs ordered are listed, but only abnormal results are displayed) Labs Reviewed  COMPREHENSIVE METABOLIC PANEL - Abnormal; Notable for the following components:      Result Value   Potassium 3.3 (*)    Glucose, Bld 138 (*)    All other components within normal limits  CBC WITH DIFFERENTIAL/PLATELET - Abnormal; Notable for the following components:   WBC 14.2 (*)    RBC 5.54 (*)    Hemoglobin 16.2 (*)    HCT 47.7 (*)    Neutro Abs 12.1 (*)    All other components within normal limits  URINALYSIS, ROUTINE W REFLEX MICROSCOPIC - Abnormal; Notable for the following components:   Ketones, ur 5 (*)    Protein, ur >=300 (*)    All other components within normal limits  RAPID URINE DRUG SCREEN, HOSP PERFORMED - Abnormal; Notable for the following components:   Opiates POSITIVE (*)    All other components within normal limits  LACTIC ACID, PLASMA - Abnormal; Notable for the following components:   Lactic Acid, Venous 2.9 (*)    All other components within normal limits  CBG MONITORING, ED - Abnormal; Notable for the following components:   Glucose-Capillary 149 (*)    All other components within  normal limits  CULTURE, BLOOD (ROUTINE X 2)  CULTURE, BLOOD (ROUTINE X 2)  ETHANOL  LIPASE, BLOOD  AMMONIA  TSH  T4, FREE    EKG EKG Interpretation  Date/Time:  Tuesday Sep 19 2021 21:30:86 EDT Ventricular Rate:  92 PR Interval:  167 QRS Duration: 102 QT Interval:  415 QTC Calculation: 508 R Axis:   89 Text Interpretation: Sinus rhythm Ventricular premature complex Consider right atrial enlargement ST-t wave abnormality Baseline wander Artifact Abnormal ECG Confirmed by Gerhard Munch [phone]) on 09/19/2021 7:33:23 AM  Radiology CT HEAD WO CONTRAST  Result Date: 09/19/2021 CLINICAL DATA:  Combative, altered mental status, recent diagnosis of UTI EXAM: CT HEAD WITHOUT CONTRAST  TECHNIQUE: Contiguous axial images were obtained from the base of the skull through the vertex without intravenous contrast. RADIATION DOSE REDUCTION: This exam was performed according to the departmental dose-optimization program which includes automated exposure control, adjustment of the mA and/or kV according to patient size and/or use of iterative reconstruction technique. COMPARISON:  CT head 08/23/2021 FINDINGS: Brain: There is hypodensity in the bilateral parieto-occipital regions and right cerebellar hemisphere with the appearance raising suspicion for vasogenic edema not seen on the study from 08/23/2021 (6-33, 6-24, 3-24). There is no significant mass effect. There is no acute intracranial hemorrhage or extra-axial fluid collection. Parenchymal volume is normal. The ventricles are normal in size. There is no midline shift. Vascular: No hyperdense vessel or unexpected calcification. Skull: Normal. Negative for fracture or focal lesion. Sinuses/Orbits: The imaged paranasal sinuses are clear. The globes and orbits are unremarkable. Other: There is a perforated nasal septum. IMPRESSION: 1. New suspected vasogenic edema in the bilateral parieto-occipital regions and right cerebellar hemisphere. Recommend brain MRI  with and without contrast for further evaluation. 2. Perforated nasal septum. Electronically Signed   By: Lesia Hausen M.D.   On: 09/19/2021 09:43   MR BRAIN WO CONTRAST  Result Date: 09/19/2021 CLINICAL DATA:  Neuro deficit, acute, stroke suspected EXAM: MRI HEAD WITHOUT CONTRAST TECHNIQUE: Multiplanar, multiecho pulse sequences of the brain and surrounding structures were obtained without intravenous contrast. COMPARISON:  CT head from the same day. FINDINGS: Motion limited and incomplete exam due to patient intolerance. Only DWI/ADC, sagittal T1 and axial T2 sequences performed. Brain: Abnormal T2 hyperintensity in bilateral cortical and subcortical parieto-occipital regions and bilateral cerebellum, compatible with edema. A few areas of restricted diffusion within the right cerebellum (for example see series 13, image 10). No obvious evidence of hydrocephalus, mass lesion, midline shift, extra-axial fluid collection, or acute hemorrhage. Vascular: Major arterial flow voids appear maintained at the skull base. Skull and upper cervical spine: Normal marrow signal. Sinuses/Orbits: Clear sinuses.  No acute orbital findings. Other: No mastoid effusions. IMPRESSION: 1. Motion limited and incomplete exam due to patient intolerance. Abnormal edema in bilateral cortical and subcortical parieto-occipital regions and bilateral cerebellar hemispheres. 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 A follow-up MRI is recommended when the patient is able (possibly with sedation) to further assess and evaluate for other etiologies. 2. A few small foci of restricted diffusion in the right cerebellum, which could be related to the above and/or superimposed small acute infarcts. Electronically Signed   By: Feliberto Harts M.D.   On: 09/19/2021 13:47   DG Chest Port 1 View  Result Date: 09/19/2021 CLINICAL DATA:  Recent UTI, altered mental status. EXAM: PORTABLE  CHEST 1 VIEW COMPARISON:  August 23, 2021. FINDINGS: EKG leads project over the chest. Cardiomediastinal contours and hilar structures are normal. Lungs are clear.  No visible pneumothorax. On limited assessment there is no acute skeletal process. IMPRESSION: No acute cardiopulmonary disease. Electronically Signed   By: Donzetta Kohut M.D.   On: 09/19/2021 07:53    Procedures Procedures    Medications Ordered in ED Medications  labetalol (NORMODYNE) injection 10 mg (has no administration in time range)  LORazepam (ATIVAN) injection 2 mg (2 mg Intramuscular Given by Other 09/19/21 0658)  sodium chloride 0.9 % bolus 1,000 mL (0 mLs Intravenous Stopped 09/19/21 1157)  haloperidol lactate (HALDOL) injection 5 mg (5 mg Intravenous Given 09/19/21 0946)  HYDROmorphone (DILAUDID) injection 1 mg (1 mg Intravenous Given 09/19/21 1142)  ED Course/ Medical Decision Making/ A&P This patient with a Hx of chronic pain, narcotic use, multiple other medical problems including presents to the ED for concern of altered mental status, this involves an extensive number of treatment options, and is a complaint that carries with it a high risk of complications and morbidity.    The differential diagnosis includes encephalopathy, overdose, bacteremia, sepsis   Social Determinants of Health:  Substance abuse, chronic pain  Additional history obtained:  Additional history and/or information obtained from husband eventually, chart review, notable for husband for HPI, chart review for admission for similar presentation with AKI last month   After the initial evaluation, orders, including: CT, labs, Haldol to facilitate care, soft restraints were initiated.   Patient placed on Cardiac and Pulse-Oximetry Monitors. The patient was maintained on a cardiac monitor.  The cardiac monitored showed an rhythm of 70 sinus normal The patient was also maintained on pulse oximetry. The readings were typically 92% room air  borderline   On repeat evaluation of the patient  episodically improved, with periods of worsening mental status, agitation requiring Haldol as above, Dilaudid given some concern for withdrawal as she has not taken her medication since yesterday  Lab Tests:  I personally interpreted labs.  The pertinent results include: Lactic acidosis, leukocytosis  Imaging Studies ordered:  I independently visualized and interpreted imaging which showed CT followed up with MRI, concerning for possible press I agree with the radiologist interpretation  Consultations Obtained:  I requested consultation with the internal medicine,  and discussed lab and imaging findings as well as pertinent plan - they recommend: Admission  Dispostion / Final MDM:  After consideration of the diagnostic results and the patient's response to treatment, this adult female with multiple medical issues including chronic pain requiring daily opiates presents with encephalopathy, after reportedly being gurgling with altered mental status.  Patient is encephalopathic, does respond to stimuli, there is broad differential as above.  Findings most consistent with possible press, versus withdrawal versus overdose.  Patient required Haldol for sedation with restraints, Ativan as well.  Eventually with rebound hypertension patient was on labetalol IV as well, given all of the above patient required admission for monitoring, management.  Final Clinical Impression(s) / ED Diagnoses Final diagnoses:  Acute encephalopathy  CRITICAL CARE Performed by: Gerhard Munch Total critical care time: 35 minutes Critical care time was exclusive of separately billable procedures and treating other patients. Critical care was necessary to treat or prevent imminent or life-threatening deterioration. Critical care was time spent personally by me on the following activities: development of treatment plan with patient and/or surrogate as well as nursing,  discussions with consultants, evaluation of patient's response to treatment, examination of patient, obtaining history from patient or surrogate, ordering and performing treatments and interventions, ordering and review of laboratory studies, ordering and review of radiographic studies, pulse oximetry and re-evaluation of patient's condition.    Gerhard Munch, MD 09/19/21 1525

## 2021-09-19 NOTE — ED Notes (Signed)
Upon assessment from return from MRI patient has 3 skin tears to right arm. Dressings applied to wounds at this time.  ?

## 2021-09-19 NOTE — ED Notes (Signed)
Hospitalist made aware of rectal temperature.  ?

## 2021-09-19 NOTE — Assessment & Plan Note (Addendum)
Consult neurology appreciated ?- goals BP<140/90  ?- Pt refuses to complete stroke work up and leaving against medical advice  ?-Pt admitted to me that she was not taking her BP meds.  ?-Pt was strongly advised to take her BP meds ?

## 2021-09-19 NOTE — Progress Notes (Addendum)
1915:  Received pt from prior shift nurse at this time.  Pt is agitated and pulling on restraints and her purewick.  Attempted to calm pt down with no success.  ?1950:  Informed Dr. Thomes Dinning of pt agitation and received one time order for haldol 2mg .  Given at this time.   ?2030:  Pt  continues to be agitated after haldol given. Unable to get BP on pt due to agitation.  She does not follow any commands but does randomly swear at staff.  Pt attempts to kick at staff.   ?2045:  Notified Dr. 2046 to come to bedside to see pt. ?2100:  Dr. [id number] here at bedside.   ?2115:  Ativan 2mg  given IM per dr orders.  2130:  Pt remains agitated and continues to not follow commands.  She is tachycardic on the monitor.  Pt was placed in four point restraints per dr orders until pt calms down some so she does not harm herself or staff.  ?2230:  Dr. [id number] remained in unit or at bedside until this time.  Precedex was started, see MAR for start time. Continuing to monitor pt closely for safety.  ?2345:  Bilateral ankle restraints removed at this time due to pt calm and sleeping at this time.  Non-violent bilateral wrist restraints initiated to prevent pt from pulling out IV's.  Pt had pulled out several IV sites in the ER.  Pt received magnesium and potassium replacements per dr orders.  ?2231:  Notified Dr. Thomes Dinning about pt HR dropping to the 40-50's.  He is aware and no new orders given.  ?

## 2021-09-19 NOTE — Assessment & Plan Note (Signed)
PDMP reviewed ?Oxycodone 10 mg, #90--refilled 08/23/21 ?Oxy ER 15 mg, # 60--refilled 08/23/21 and 08/12/21 ?Clonazepam 0.5 mg, #7--filled 08/29/21 ?

## 2021-09-19 NOTE — ED Notes (Signed)
Attempted to take patient to CT. Patient attempting to grab staffs hair, get out of the bed, and hit at staff. Per EDP unable to give additional meds at this time and will attempt CT once patient is more stable.  ?

## 2021-09-19 NOTE — ED Notes (Signed)
Patient transported to MRI 

## 2021-09-19 NOTE — ED Notes (Signed)
Patient is attempting to get out of bed, hit staff, and pull out lines. Verbal order obtained for soft wrist restraints at this time.  ?

## 2021-09-19 NOTE — Assessment & Plan Note (Addendum)
Resumed all home BP meds ?Pt is leaving against medical advice today  ?I counseled her on the risks of leaving including death and she verbalized understanding ?

## 2021-09-19 NOTE — Progress Notes (Signed)
Patient will respond to her name & when asked several times if she knew where she was she would just say yeah & turn her head & close her eyes, she has PO meds ordered but is unable to follow command to take PO meds at this time after much encouragement, MD aware  ?

## 2021-09-19 NOTE — Assessment & Plan Note (Addendum)
No chest pain presently ?Personally reviewed EKG--sinus rhythm, non specific ST-T wave change ?Continue Plavix and statin when able to take orals again ?

## 2021-09-19 NOTE — ED Notes (Signed)
Upon assessment patient soiled with urine. Peri care provided, linens changed, and new brief and purwick placed.  ?

## 2021-09-19 NOTE — ED Triage Notes (Signed)
Pt's husband woke to pt gurling and vomiting. He called 911. Pt recently diagnosed with UTI. When fire department arrived she started fighting with them.  ?

## 2021-09-19 NOTE — ED Notes (Addendum)
Patient is combative, removed IV, and attempting to grab at staff. MRI attempted to take patient but unable to due to behavior. MRI states they will try again later.  ?

## 2021-09-19 NOTE — Assessment & Plan Note (Addendum)
Will offer nicotine patch ?Pt was counseled at length about cessation.  ?

## 2021-09-19 NOTE — Assessment & Plan Note (Addendum)
-   Continue home statin °

## 2021-09-19 NOTE — Assessment & Plan Note (Signed)
Stable on room air ?As needed bronchodilators ?

## 2021-09-20 ENCOUNTER — Inpatient Hospital Stay (HOSPITAL_COMMUNITY): Payer: Medicare Other

## 2021-09-20 ENCOUNTER — Inpatient Hospital Stay (HOSPITAL_COMMUNITY)
Admit: 2021-09-20 | Discharge: 2021-09-20 | Disposition: A | Discharge status: Home / Self Care | Payer: Medicare Other | Attending: Internal Medicine | Admitting: Internal Medicine

## 2021-09-20 DIAGNOSIS — E785 Hyperlipidemia, unspecified: Secondary | ICD-10-CM

## 2021-09-20 DIAGNOSIS — F430 Acute stress reaction: Secondary | ICD-10-CM

## 2021-09-20 DIAGNOSIS — I639 Cerebral infarction, unspecified: Secondary | ICD-10-CM

## 2021-09-20 LAB — CBC
HCT: 41.5 % (ref 36.0–46.0)
Hemoglobin: 13.7 g/dL (ref 12.0–15.0)
MCH: 29 pg (ref 26.0–34.0)
MCHC: 33 g/dL (ref 30.0–36.0)
MCV: 87.7 fL (ref 80.0–100.0)
Platelets: 229 10*3/uL (ref 150–400)
RBC: 4.73 MIL/uL (ref 3.87–5.11)
RDW: 15.2 % (ref 11.5–15.5)
WBC: 13 10*3/uL — ABNORMAL HIGH (ref 4.0–10.5)
nRBC: 0 % (ref 0.0–0.2)

## 2021-09-20 LAB — COMPREHENSIVE METABOLIC PANEL
ALT: 19 U/L (ref 0–44)
AST: 28 U/L (ref 15–41)
Albumin: 3.6 g/dL (ref 3.5–5.0)
Alkaline Phosphatase: 78 U/L (ref 38–126)
Anion gap: 6 (ref 5–15)
BUN: 15 mg/dL (ref 8–23)
CO2: 22 mmol/L (ref 22–32)
Calcium: 8.6 mg/dL — ABNORMAL LOW (ref 8.9–10.3)
Chloride: 112 mmol/L — ABNORMAL HIGH (ref 98–111)
Creatinine, Ser: 0.81 mg/dL (ref 0.44–1.00)
GFR, Estimated: >60 mL/min (ref >60)
Glucose, Bld: 135 mg/dL — ABNORMAL HIGH (ref 70–99)
Potassium: 3.9 mmol/L (ref 3.5–5.1)
Sodium: 140 mmol/L (ref 135–145)
Total Bilirubin: 0.8 mg/dL (ref 0.3–1.2)
Total Protein: 6.3 g/dL — ABNORMAL LOW (ref 6.5–8.1)

## 2021-09-20 LAB — LACTIC ACID, PLASMA: Lactic Acid, Venous: 1.2 mmol/L (ref 0.5–1.9)

## 2021-09-20 LAB — MAGNESIUM: Magnesium: 3 mg/dL — ABNORMAL HIGH (ref 1.7–2.4)

## 2021-09-20 LAB — LIPID PANEL
Cholesterol: 147 mg/dL (ref 0–200)
HDL: 43 mg/dL (ref >40)
LDL Cholesterol: 79 mg/dL (ref 0–99)
Total CHOL/HDL Ratio: 3.4 RATIO
Triglycerides: 123 mg/dL (ref <150)
VLDL: 25 mg/dL (ref 0–40)

## 2021-09-20 LAB — ANA: Anti Nuclear Antibody (ANA): NEGATIVE

## 2021-09-20 IMAGING — MR MR HEAD W/O CM
11 of 12 series · 42 of 48 positions shown · non-contrast
Comparison: Brain MRI from [DATE].

CLINICAL DATA: Follow-up examination for stroke, altered mental
status.

EXAM:
MRI HEAD WITHOUT CONTRAST
MRA HEAD WITHOUT CONTRAST
TECHNIQUE: Multiplanar, multi-echo pulse sequences of the brain and surrounding
structures were acquired without intravenous contrast. Angiographic
images of the Circle of Willis were acquired using MRA technique
without intravenous contrast.

[Series 1: DWI · coronal · 4.0mm · 0.88mm/px · 5 of 32 slices shown (1 of 6)]
[im 1/32]
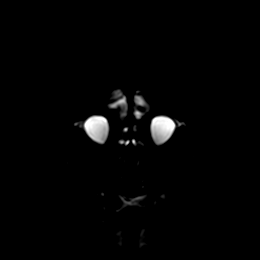
[im 8/32]
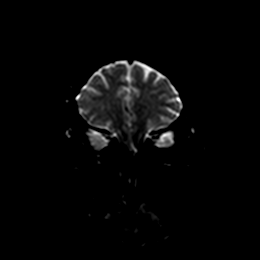
[im 16/32]
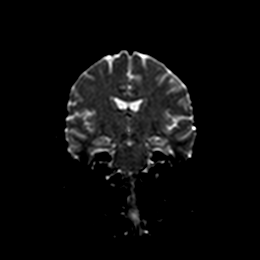
[im 24/32]
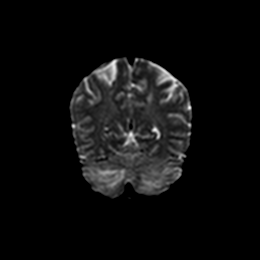
[im 32/32]
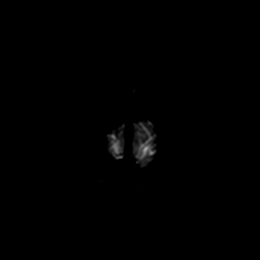

[Series 1: DWI · coronal · 4.0mm · 0.88mm/px · 4 of 32 slices shown (2 of 6)]
[im 1/32]
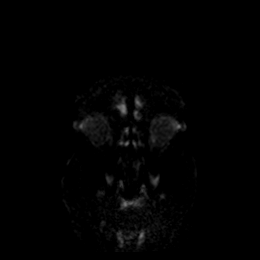
[im 11/32]
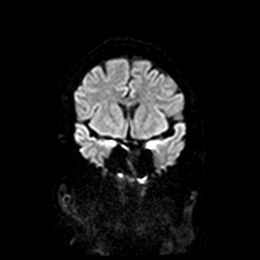
[im 21/32]
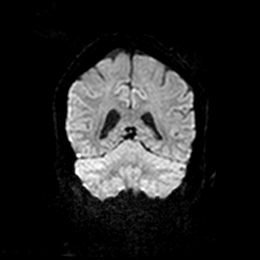
[im 32/32]
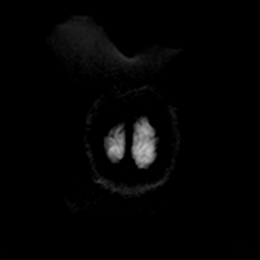

[Series 2: DWI · coronal · 4.0mm · 0.88mm/px · 4 of 32 slices shown (3 of 6)]
[im 1/32]
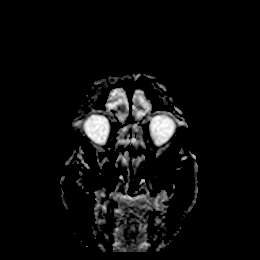
[im 11/32]
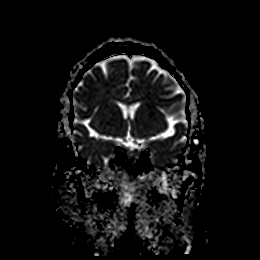
[im 21/32]
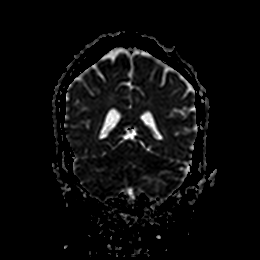
[im 32/32]
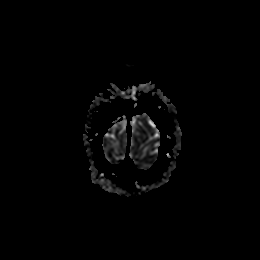

[Series 3: T1 · sagittal · 5.0mm · 0.80mm/px · 3 of 21 slices shown]
[im 1/21]
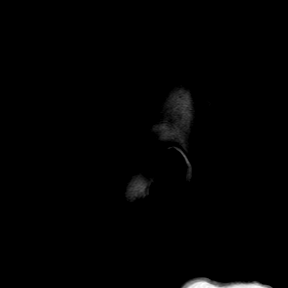
[im 11/21]
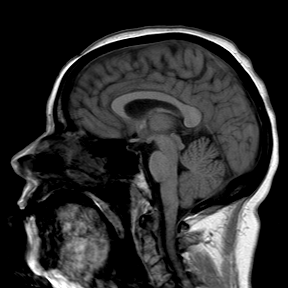
[im 21/21]
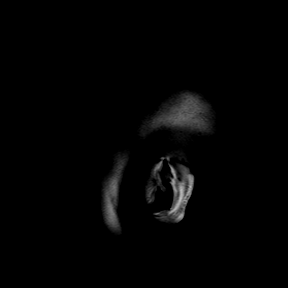

[Series 4: T2 · axial · 5.0mm · 0.72mm/px · z∈[-75,+79]mm · 3 of 23 slices shown (1 of 2)]
[im 1/23]
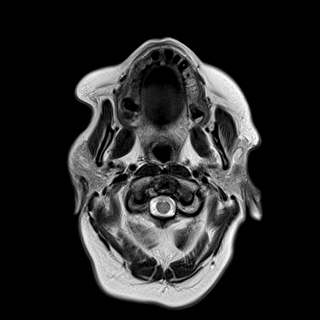
[im 12/23]
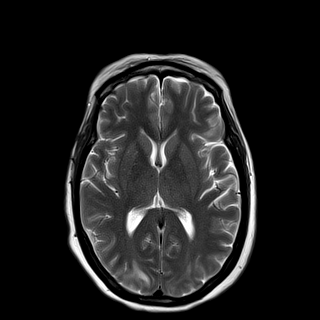
[im 23/23]
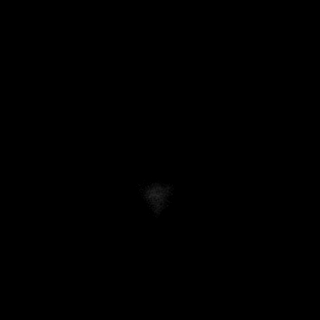

[Series 5: ax hemo · axial · 5.0mm · 0.86mm/px · z∈[-70,+74]mm · 3 of 25 slices shown]
[im 1/25]
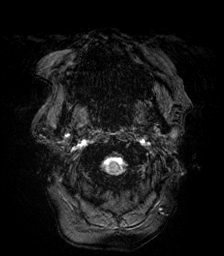
[im 13/25]
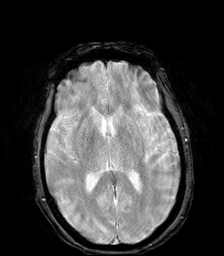
[im 25/25]
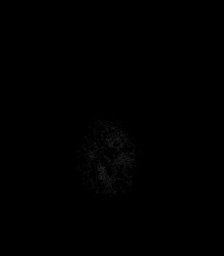

[Series 6: FLAIR · axial · 4.0mm · 0.43mm/px · z∈[-72,+76]mm · 5 of 38 slices shown]
[im 1/38]
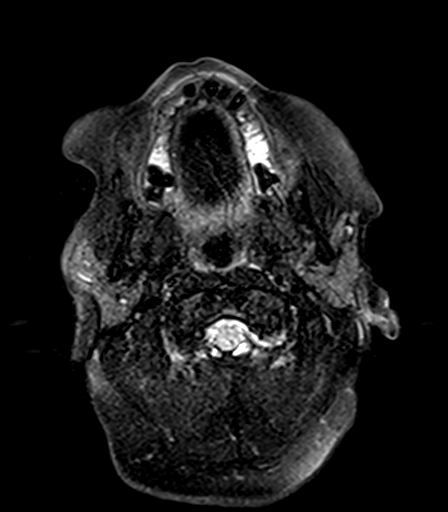
[im 10/38]
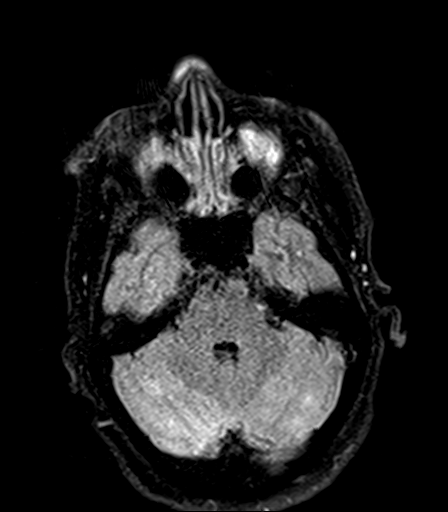
[im 19/38]
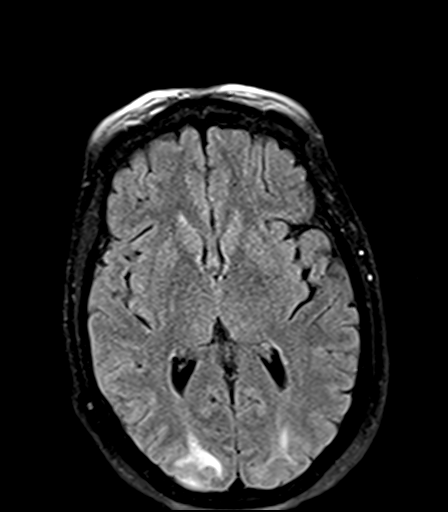
[im 28/38]
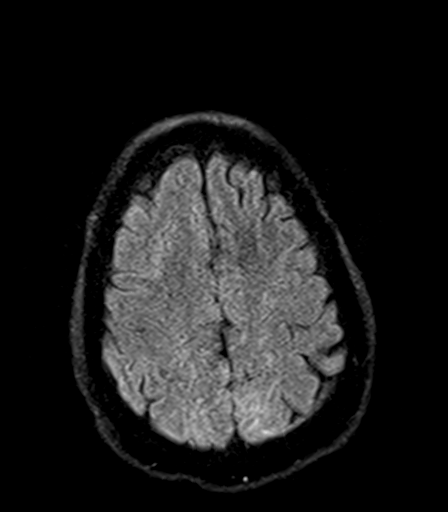
[im 38/38]
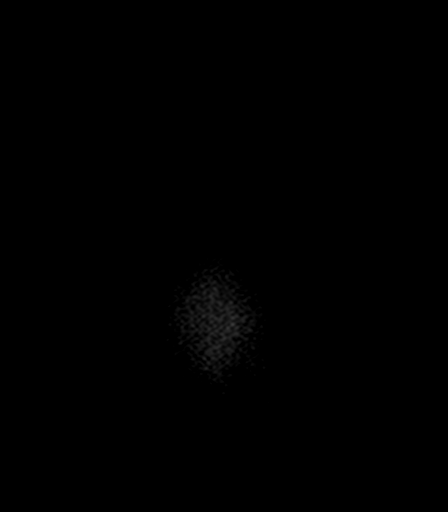

[Series 8: T2 · coronal · 5.0mm · 0.72mm/px · 3 of 28 slices shown (2 of 2)]
[im 1/28]
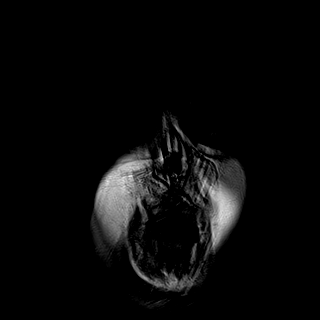
[im 14/28]
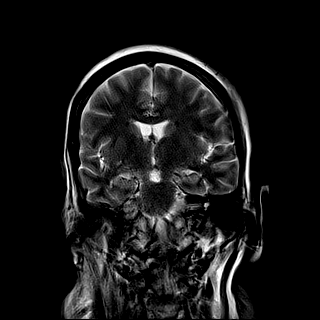
[im 28/28]
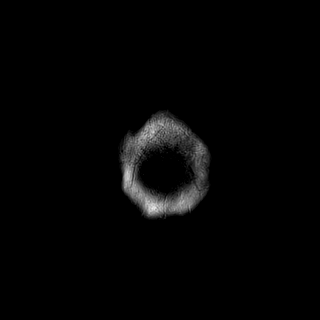

[Series 100: DWI · axial · 4.0mm · 0.88mm/px · z∈[-68,+72]mm · 4 of 36 slices shown (4 of 6)]
[im 1/36]
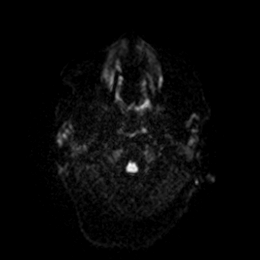
[im 12/36]
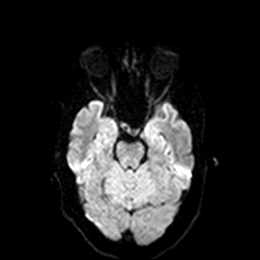
[im 24/36]
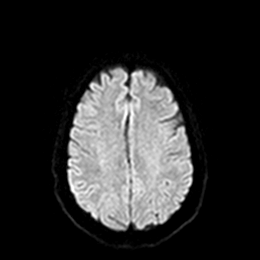
[im 36/36]
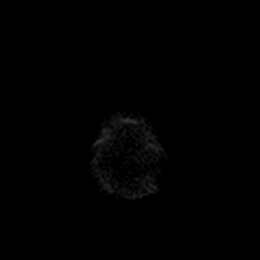

[Series 100: DWI · axial · 4.0mm · 0.88mm/px · z∈[-68,+72]mm · 4 of 36 slices shown (5 of 6)]
[im 1/36]
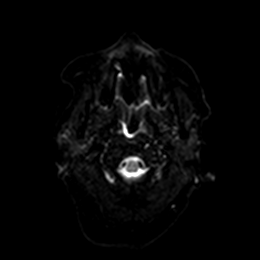
[im 12/36]
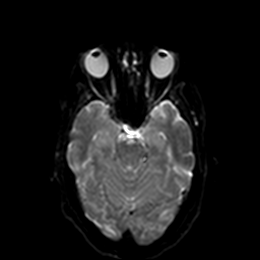
[im 24/36]
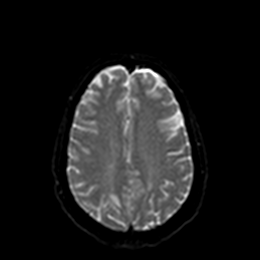
[im 36/36]
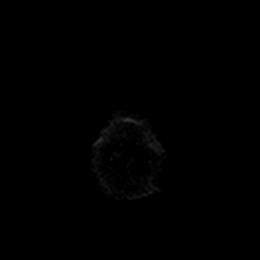

[Series 101: DWI · axial · 4.0mm · 0.88mm/px · z∈[-68,+72]mm · 4 of 36 slices shown (6 of 6)]
[im 1/36]
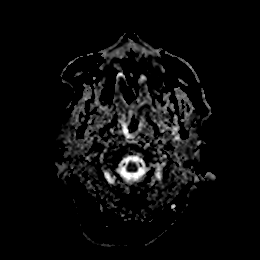
[im 12/36]
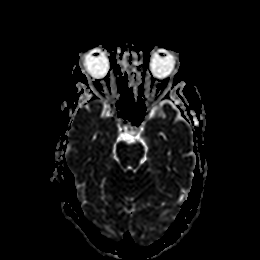
[im 24/36]
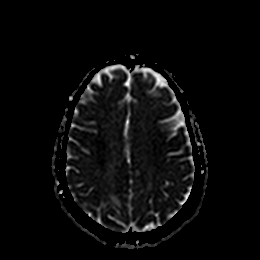
[im 36/36]
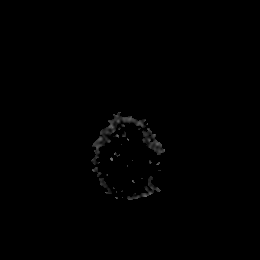

[42 of 48 positions shown; findings below may reference images not displayed]

FINDINGS: MRI HEAD FINDINGS

Brain: Examination mildly degraded by motion.

Cerebral volume within normal limits. Again seen is patchy and
confluent T2/FLAIR signal abnormality involving the cortical and
subcortical aspects of the parieto-occipital regions bilaterally as
well as the cerebellum, highly suggestive of PRES. Overall
appearance is relatively similar to previous. Few small superimposed
patchy foci of diffusion abnormality again noted at the right
cerebellum (series 100, image 8), similar to previous, and favored
to be related to PRES, although superimposed ischemic changes again
not entirely excluded. No associated blood products.

No other evidence for new or interval infarction. Gray-white matter
differentiation otherwise maintained. No mass lesion or significant
regional mass effect. No midline shift or hydrocephalus. No
extra-axial fluid collection.

Vascular: Major intracranial vascular flow voids are maintained.

Skull and upper cervical spine: Craniocervical junction within
normal limits. Bone marrow signal intensity diffusely decreased on
T1 weighted sequence, nonspecific, but most commonly related to
anemia, smoking or obesity. No focal marrow replacing lesion. No
scalp soft tissue abnormality.

Sinuses/Orbits: Globes and orbital soft tissues within normal
limits. Scattered mucosal thickening noted within the ethmoidal air
cells. No significant mastoid effusion.

Other: None.

MRA HEAD FINDINGS

Anterior circulation: Examination degraded by motion artifact.

Visualized distal cervical segments of the internal carotid arteries
are patent with antegrade flow. Both ICAs patent through the siphons
without significant stenosis or other definite abnormality. A1
segments patent. Normal anterior communicating artery complex.
Anterior cerebral arteries patent to their distal aspects without
appreciable stenosis. No M1 stenosis or occlusion. Grossly normal
MCA bifurcations. No proximal MCA branch occlusion. Distal MCA
branches perfused and symmetric.

Posterior circulation: Visualized V4 segments patent without
stenosis. Left vertebral artery dominant. Right PICA patent. Left
PICA origin not seen. Basilar patent to its distal aspect without
stenosis. Superior cerebral arteries patent bilaterally. Both PCAs
primarily supplied via the basilar and are well perfused to their
distal aspects without flow-limiting stenosis.

Anatomic variants: None significant. No visible aneurysm or other
vascular malformation.
IMPRESSION: MRI HEAD:

1. Patchy and confluent T2/FLAIR signal abnormality involving the
cortical and subcortical aspects of the parieto-occipital regions
and cerebellum, highly suggestive of PRES. Overall, appearance is
relatively similar as compared to previous.
2. Few small superimposed foci of diffusion abnormality within the
right cerebellum, similar to previous, and favored to be related to
PRES, although superimposed ischemic changes again are not entirely
excluded.
3. No other new acute intracranial abnormality.

MRA HEAD:

1. Motion degraded exam.
2. Negative intracranial MRA. No large vessel occlusion,
hemodynamically significant or correctable stenosis, or other acute
vascular abnormality.

## 2021-09-20 IMAGING — MR MR MRA HEAD W/O CM
1 series · 48 of 48 positions shown · non-contrast
Comparison: Brain MRI from [DATE].

CLINICAL DATA: Follow-up examination for stroke, altered mental
status.

EXAM:
MRI HEAD WITHOUT CONTRAST
MRA HEAD WITHOUT CONTRAST
TECHNIQUE: Multiplanar, multi-echo pulse sequences of the brain and surrounding
structures were acquired without intravenous contrast. Angiographic
images of the Circle of Willis were acquired using MRA technique
without intravenous contrast.

[Series 7: TOF · axial · 0.5mm · 0.76mm/px · z∈[-65,+28]mm · 48 of 200 slices shown]
[im 1/200]
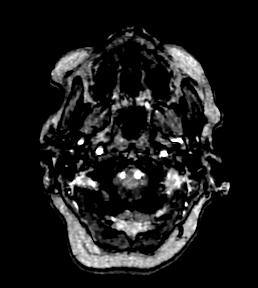
[im 5/200]
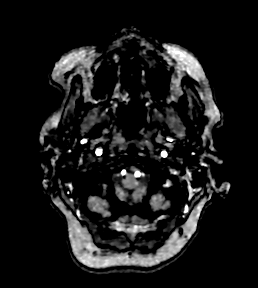
[im 9/200]
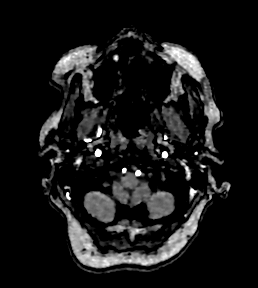
[im 13/200]
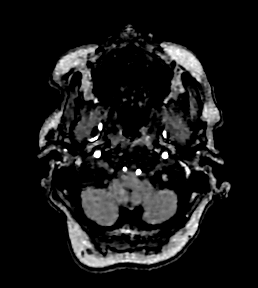
[im 17/200]
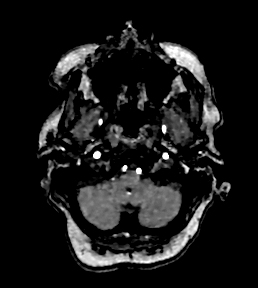
[im 22/200]
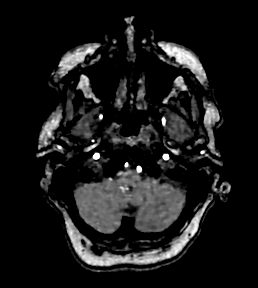
[im 26/200]
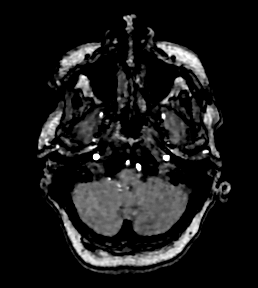
[im 30/200]
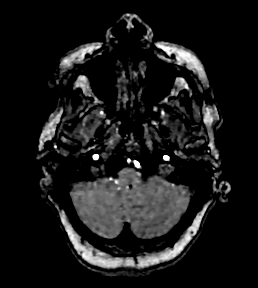
[im 34/200]
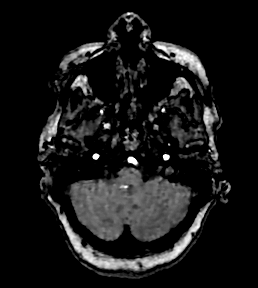
[im 39/200]
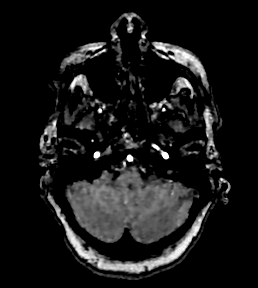
[im 43/200]
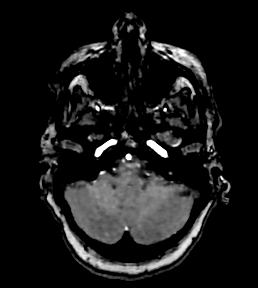
[im 47/200]
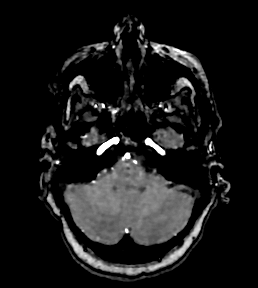
[im 51/200]
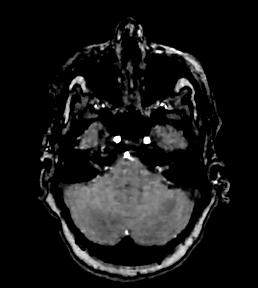
[im 56/200]
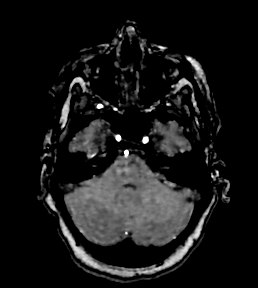
[im 60/200]
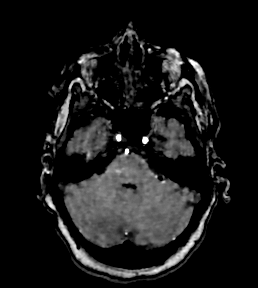
[im 64/200]
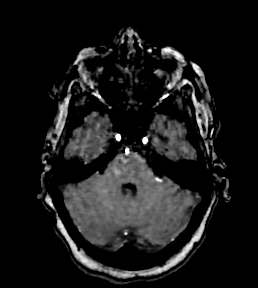
[im 68/200]
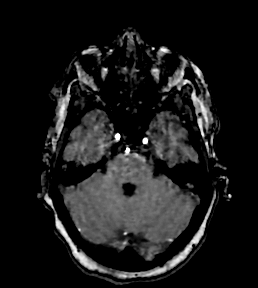
[im 72/200]
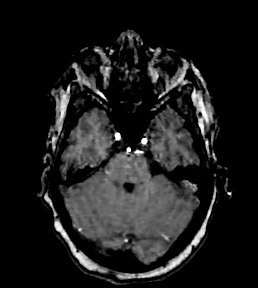
[im 77/200]
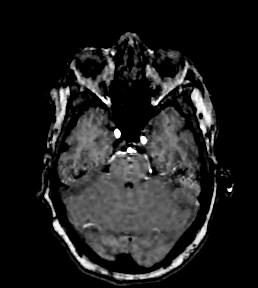
[im 81/200]
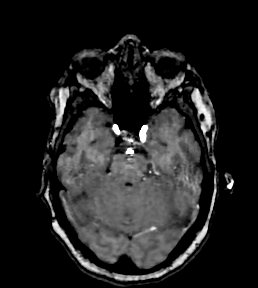
[im 85/200]
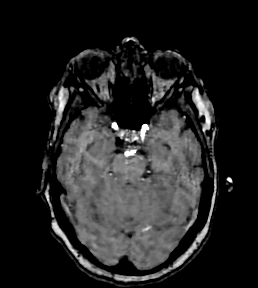
[im 89/200]
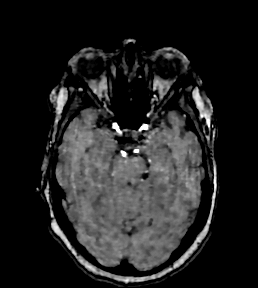
[im 94/200]
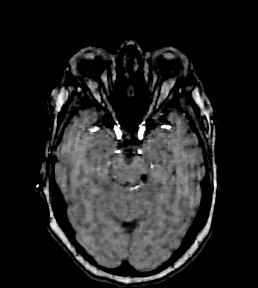
[im 98/200]
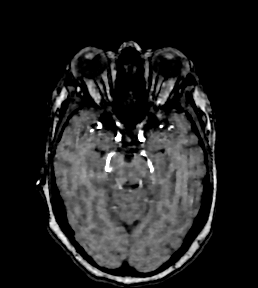
[im 102/200]
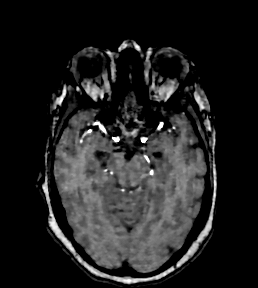
[im 106/200]
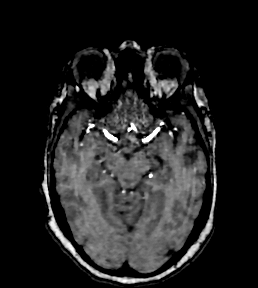
[im 111/200]
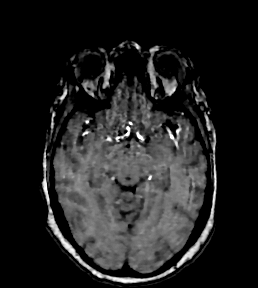
[im 115/200]
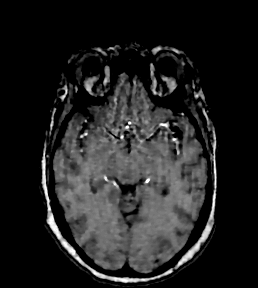
[im 119/200]
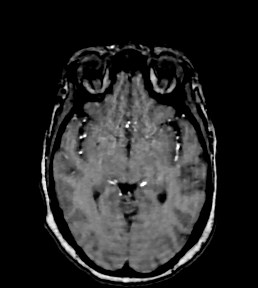
[im 123/200]
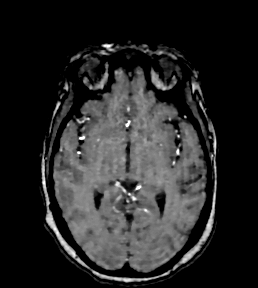
[im 128/200]
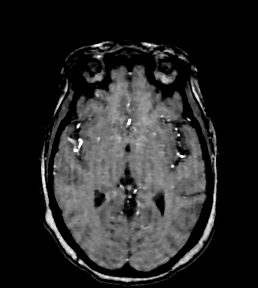
[im 132/200]
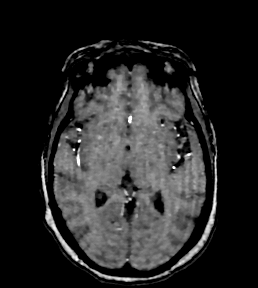
[im 136/200]
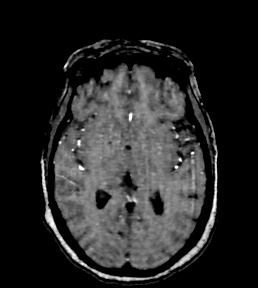
[im 140/200]
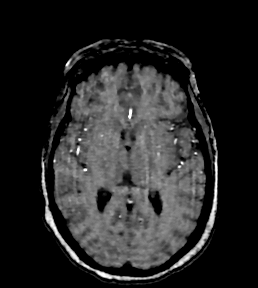
[im 144/200]
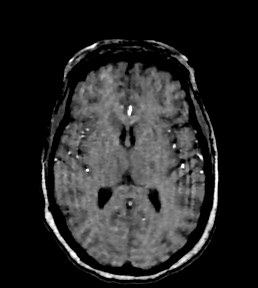
[im 149/200]
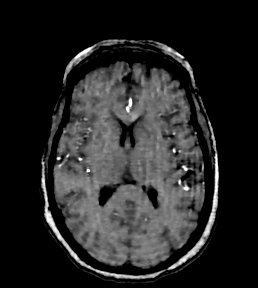
[im 153/200]
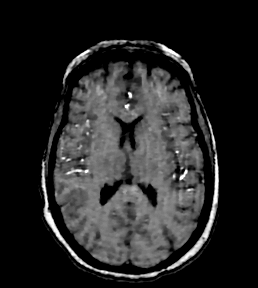
[im 157/200]
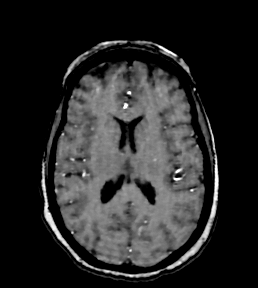
[im 161/200]
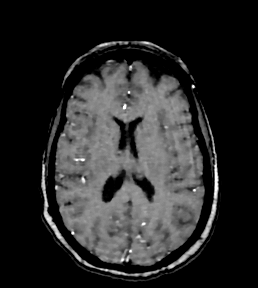
[im 166/200]
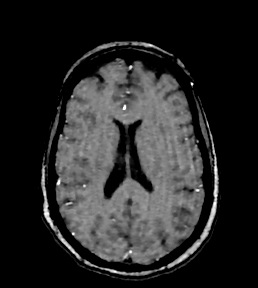
[im 170/200]
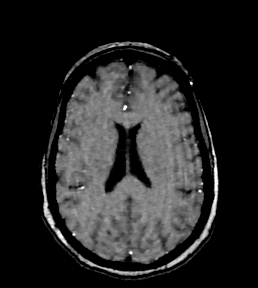
[im 174/200]
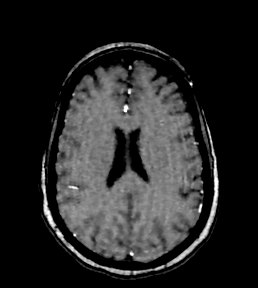
[im 178/200]
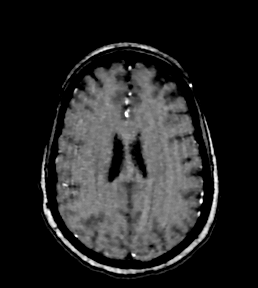
[im 183/200]
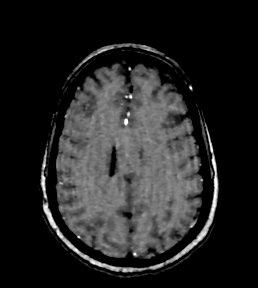
[im 187/200]
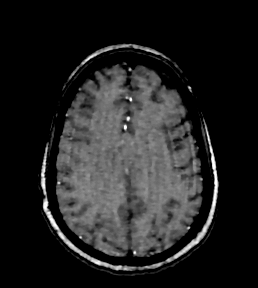
[im 191/200]
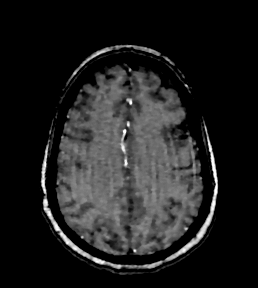
[im 195/200]
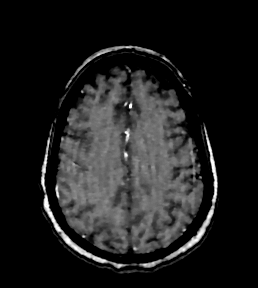
[im 200/200]
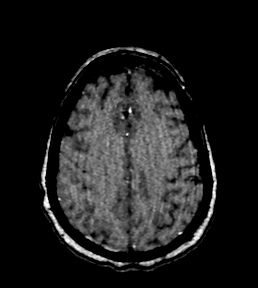

[48 of 48 positions shown; findings below may reference images not displayed]

FINDINGS: MRI HEAD FINDINGS

Brain: Examination mildly degraded by motion.

Cerebral volume within normal limits. Again seen is patchy and
confluent T2/FLAIR signal abnormality involving the cortical and
subcortical aspects of the parieto-occipital regions bilaterally as
well as the cerebellum, highly suggestive of PRES. Overall
appearance is relatively similar to previous. Few small superimposed
patchy foci of diffusion abnormality again noted at the right
cerebellum (series 100, image 8), similar to previous, and favored
to be related to PRES, although superimposed ischemic changes again
not entirely excluded. No associated blood products.

No other evidence for new or interval infarction. Gray-white matter
differentiation otherwise maintained. No mass lesion or significant
regional mass effect. No midline shift or hydrocephalus. No
extra-axial fluid collection.

Vascular: Major intracranial vascular flow voids are maintained.

Skull and upper cervical spine: Craniocervical junction within
normal limits. Bone marrow signal intensity diffusely decreased on
T1 weighted sequence, nonspecific, but most commonly related to
anemia, smoking or obesity. No focal marrow replacing lesion. No
scalp soft tissue abnormality.

Sinuses/Orbits: Globes and orbital soft tissues within normal
limits. Scattered mucosal thickening noted within the ethmoidal air
cells. No significant mastoid effusion.

Other: None.

MRA HEAD FINDINGS

Anterior circulation: Examination degraded by motion artifact.

Visualized distal cervical segments of the internal carotid arteries
are patent with antegrade flow. Both ICAs patent through the siphons
without significant stenosis or other definite abnormality. A1
segments patent. Normal anterior communicating artery complex.
Anterior cerebral arteries patent to their distal aspects without
appreciable stenosis. No M1 stenosis or occlusion. Grossly normal
MCA bifurcations. No proximal MCA branch occlusion. Distal MCA
branches perfused and symmetric.

Posterior circulation: Visualized V4 segments patent without
stenosis. Left vertebral artery dominant. Right PICA patent. Left
PICA origin not seen. Basilar patent to its distal aspect without
stenosis. Superior cerebral arteries patent bilaterally. Both PCAs
primarily supplied via the basilar and are well perfused to their
distal aspects without flow-limiting stenosis.

Anatomic variants: None significant. No visible aneurysm or other
vascular malformation.
IMPRESSION: MRI HEAD:

1. Patchy and confluent T2/FLAIR signal abnormality involving the
cortical and subcortical aspects of the parieto-occipital regions
and cerebellum, highly suggestive of PRES. Overall, appearance is
relatively similar as compared to previous.
2. Few small superimposed foci of diffusion abnormality within the
right cerebellum, similar to previous, and favored to be related to
PRES, although superimposed ischemic changes again are not entirely
excluded.
3. No other new acute intracranial abnormality.

MRA HEAD:

1. Motion degraded exam.
2. Negative intracranial MRA. No large vessel occlusion,
hemodynamically significant or correctable stenosis, or other acute
vascular abnormality.

## 2021-09-20 MED ORDER — LORAZEPAM 2 MG/ML IJ SOLN
2.0000 mg | Freq: Once | INTRAMUSCULAR | Status: AC
  Administered 2021-09-20: 2 mg via INTRAVENOUS
  Filled 2021-09-20: qty 1

## 2021-09-20 MED ORDER — OXYCODONE HCL 5 MG PO TABS
10.0000 mg | ORAL_TABLET | Freq: Four times a day (QID) | ORAL | Status: DC | PRN
  Administered 2021-09-20 – 2021-09-21 (×3): 10 mg via ORAL
  Filled 2021-09-20 (×3): qty 2

## 2021-09-20 MED ORDER — CARVEDILOL 3.125 MG PO TABS
3.1250 mg | ORAL_TABLET | Freq: Once | ORAL | Status: AC
  Administered 2021-09-20: 3.125 mg via ORAL
  Filled 2021-09-20: qty 1

## 2021-09-20 MED ORDER — LOSARTAN POTASSIUM 50 MG PO TABS
50.0000 mg | ORAL_TABLET | Freq: Once | ORAL | Status: AC
  Administered 2021-09-20: 50 mg via ORAL
  Filled 2021-09-20: qty 1

## 2021-09-20 MED ORDER — LABETALOL HCL 5 MG/ML IV SOLN: 10.0000 mg | INTRAVENOUS | Status: DC | PRN

## 2021-09-20 MED ORDER — CLOPIDOGREL BISULFATE 75 MG PO TABS
75.0000 mg | ORAL_TABLET | Freq: Once | ORAL | Status: AC
  Administered 2021-09-20: 75 mg via ORAL
  Filled 2021-09-20: qty 1

## 2021-09-20 MED ORDER — HYDRALAZINE HCL 20 MG/ML IJ SOLN: 5.0000 mg | INTRAMUSCULAR | Status: DC | PRN

## 2021-09-20 NOTE — Progress Notes (Signed)
Notified by Jessica Bell that she will be here around 4:30pm to do patient's EEG, patient made aware & explained the procedure & reported she would be able to cooperate, family at bedside & aware  ?

## 2021-09-20 NOTE — Progress Notes (Signed)
Family arrived & patient perked up more, was able to report to this RN who her sig other was & called her daughter Darrick Penna by her other daughter's name who had previously passed away, she said she was at home & the year is 39, asked about pain & using 0-10 pain scale she was able to report 10/10 chronic pain to upper/mid/lower back & asked for her pain medication, this RN asked if she would be able to swallow PO meds now & she reported that she could, patient was able to swallow PRN oxycodone w/o difficulty, set-up lunch tray for patient but she refused to eat & pushed her bedside table away from her ?

## 2021-09-20 NOTE — Progress Notes (Signed)
EEG completed, results pending. 

## 2021-09-20 NOTE — Procedures (Signed)
Patient Name: Jessica Bell  ?MRN: [id number]  ?Epilepsy Attending: Lora Havens  ?Referring Physician/Provider: Orson Eva, MD ?Date: 09/20/2021 ?Duration: 22.35 mins ? ?Patient history:  61yo F with ams. EEG to evaluate for seizure. ? ?Level of alertness: Awake, asleep ? ?AEDs during EEG study: None ? ?Technical aspects: This EEG study was done with scalp electrodes positioned according to the 10-20 International system of electrode placement. Electrical activity was acquired at a sampling rate of 500Hz  and reviewed with a high frequency filter of 70Hz  and a low frequency filter of 1Hz . EEG data were recorded continuously and digitally stored.  ? ?Description: The posterior dominant rhythm consists of 7.5 Hz activity of moderate voltage (25-35 uV) seen predominantly in posterior head regions, symmetric and reactive to eye opening and eye closing. Sleep was characterized by vertex waves maximal frontocentral region. EEG showed intermittent generalized 3 to 6 Hz theta-delta slowing. Hyperventilation and photic stimulation were not performed.    ? ?ABNORMALITY ?- Intermittent slow, generalized ? ?IMPRESSION: ?This study is suggestive of mild diffuse encephalopathy, nonspecific etiology. No seizures or epileptiform discharges were seen throughout the recording. ? ?Lora Havens  ? ?

## 2021-09-20 NOTE — Progress Notes (Signed)
Patient OTF in MRI at this time

## 2021-09-20 NOTE — Progress Notes (Signed)
Spoke with IV team who is in route now to place PICC as ordered  ?

## 2021-09-20 NOTE — Progress Notes (Signed)
RN called due to patient being agitated and pulling on restraints (two-point restraint), Haldol 2 mg x 1 was given but patient continued to be agitated with difficulty in being able to check BP.  At bedside, IM lorazepam 2 mg was given without any improvement.  Patient was tachycardic, it was difficult to check patient's BP and she was at risk of hurting herself.  So, she was placed on four-point restraints at this time, however, she continued to be agitated.  Magnesium and BMP were done to ensure there was no hypomagnesemia/hypokalemia prior to continuing with Haldol/Geodon (prolong QT).  Potassium was noted to be low at 2.9, magnesium 1.5, these were replenished.  Patient was then started on IV Precedex drip with plan to slowly discontinue bilateral ankle restraints and eventual bilateral wrist restraints when the medication (Precedex) gets to suppress patient's agitation ?Continue fall precaution and neurochecks ?Continue management as described in patient's H&P and consider need for spinal tap based on patient's continuous clinical presentation ? ?Critical time: 35 minutes ? ? Critical care personally provided  managing the patient due to high probability of clinically significant and life threatening deterioration. This critical care time included obtaining a history; examining the patient, pulse oximetry; ordering and review of studies; arranging urgent treatment with development of a management plan; evaluation of patient's response of treatment; frequent reassessment; and discussions with other providers.  This critical care time was performed to assess and manage the high probability of imminent and life threatening deterioration that could result in multi-organ failure. ? ?

## 2021-09-20 NOTE — Progress Notes (Signed)
EEG tech arrived.

## 2021-09-20 NOTE — Progress Notes (Signed)
Patient unable to safely swallow PO meds at this time, MD made aware  ?

## 2021-09-20 NOTE — Progress Notes (Signed)
Family is at the bedside at this time (Allen/sig other & Shelly/daughter), she perked up more for them, told me she wanted to try to take her PO pain med so I'm going to attempt that at this time, MD notified  ?

## 2021-09-20 NOTE — Progress Notes (Signed)
Attempted calling daughter of patient for PICC consent ,Coralee Pesa [phone] but no answer. Will try again ?

## 2021-09-20 NOTE — Progress Notes (Signed)
Spoke with patient family (Allen/sig other & Shelly/daughter) who reported that the patient at baseline would refuse a PICC line placement & they want to respect her wishes, PICC line ordered for limited venous access - patient currently has IV cath access x 2 in RIGHT forearm that are working well at this time, spoke with the IV them earlier who reported that they were in route to [facility] to place PICC for patient & this RN would get consent for them but family refused at this time, attempted to [facility] IV Team @ [phone] x 1 no answer, @ [phone] x 1 no answer, @ [phone] x 3 no answer to cancel PICC placement w/o success ?

## 2021-09-20 NOTE — Plan of Care (Signed)

## 2021-09-20 NOTE — TOC Progression Note (Signed)
Pt high risk for readmission. Unable to assess pt at this time due to disorientation. TOC will attempt assessment tomorrow if pt's condition has improved. ?

## 2021-09-20 NOTE — Progress Notes (Signed)
?PROGRESS NOTE ? ? ?Jessica Bell  [id number] DOB: 09-Oct-1960 DOA: 09/19/2021 ?PCP: Joana Reamer, DO  ? ?Chief Complaint  ?Patient presents with  ? Altered Mental Status  ? ?Level of care: ICU ? ?Brief Admission History:  ?61 year old female with a history of COPD, diastolic CHF, hypertension, opiate dependence and chronic pain syndrome, GERD, coronary artery disease, anxiety presenting with altered mental status.  The patient is unable to provide any history at this time secondary to her altered mental status.  History is obtained from speaking with the patient's fianc? at the bedside.  Apparently, the patient had been in her usual state of health until the patient was found to be minimally responsive by her fianc? around 5:30 am on 09/19/2021.  Fianc? states that the patient has been her usual self when he last saw her at 8:30 PM on 09/18/2021.  Notably, the patient was recently mated to the hospital from 08/23/2021 to 08/24/2021 with a similar presentation.  During that hospitalization, the patient had acute metabolic encephalopathy secondary to sepsis and UTI.  There is also a contribution from the patient's opioids and other hypnotic medications. Nevertheless, the patient's fianc? states that he has been administering the patient's opioids to her.  However he states that the patient administers all the other medications herself including her lorazepam and baclofen.  He states that usually when the patient takes her baclofen in addition to her other medications she becomes encephalopathic.  There have been no complaints of fevers, chills, chest pain, cough, hemoptysis, nausea, vomiting, diarrhea, abdominal pain.  The patient continues to smoke up to 1 pack/day.  Notably, spouse states patient's amlodipine was discontinued "few weeks" ago.  In the ED, the patient had a temperature of 100.6 ?F.  She was hemodynamically stable.  In fact, the patient was hypertensive up to 194/76.  Oxygen saturation was 99% on room  air.  BMP showed sodium 140, potassium 3.3, bicarbonate 22, serum creatinine 0.76.  LFTs were unremarkable.  WBC 14.9, hemoglobin 16.2, platelets 220,000.  CT of the brain shows new suspected vasogenic edema in the bilateral parietal temporal lobes and right cerebellar hemisphere.  MRI of the brain showed abnormal edema in the bilateral parieto-occipital regions with edema also in the bilateral cerebellar hemispheres.  This was suspicious for PRES. there is also a few small foci of restricted diffusion in the right cerebellum which may be related to PRES and or superimposed cerebellar infarcts.  Chest x-ray was negative for infiltrates.  EKG showed sinus rhythm and nonspecific ST-T wave changes.  UDS showed positive opiates.  Lactic acid 2.9.  The patient was given labetalol for her elevated BP.  She was also given Ativan 2 mg IM, Haldol 5 mg IV, Dilaudid 1 mg IV for her agitation. ?  ?Assessment and Plan: ?* Acute metabolic encephalopathy ?Multifactorial including PRES, opioid and hypnotic medications, and possible infectious process ?-continue antibiotics for now but rapidly de-escalate ?-UA negative for pyuria ?-Chest x-ray negative for infiltrates ?-Follow blood cultures: NGTD ?-B12 - 374  ?-TSH - 3.691 ?-neurology consultation requested  ?-Ammonia ?-VBG ?-EEG ? ?Tobacco abuse ?Will offer nicotine patch ? ?Opioid dependence (HCC) ?PDMP reviewed ?Oxycodone 10 mg, #90--refilled 08/23/21 ?Oxy ER 15 mg, # 60--refilled 08/23/21 and 08/12/21 ?Clonazepam 0.5 mg, #7--filled 08/29/21 ? ?SIRS (systemic inflammatory response syndrome) (HCC) ?Presented with tachypnea, leukocytosis, and fever 100.6 ?F ?Follow blood cultures ?Chest x-ray negative for infiltrates ?UA negative for pyuria ?Empiric vancomycin and cefepime ?Lactic acid peaked at 2.9 ?Continue IV fluids ?  DC vanc now that MRSA screen negative  ? ?PRES (posterior reversible encephalopathy syndrome) ?Consult neurology requested ?-Judicious BP lowering 10-25% ?-As needed  labetalol ?-Pt unable to take oral medications at this time per RN ?-further recs per neurology consult  ? ?Chronic diastolic CHF (congestive heart failure) (HCC) ?Clinically euvolemic ?05/11/2021 echo EF 50-55%, G1DD Distal septal HK ?Restart carvedilol ? ?COPD (chronic obstructive pulmonary disease) (HCC) ?Stable on room air ?As needed bronchodilators ? ?Essential hypertension ?Restart carvedilol when able to take p.o.  ?For now, IV labetalol ordered ? ?Dyslipidemia ?Continue statin when able to resume p.o.  ? ?CAD S/P percutaneous coronary angioplasty ?No chest pain presently ?Personally reviewed EKG--sinus rhythm, non specific ST-T wave change ?Continue Plavix and statin when able to take orals again ? ? ?DVT prophylaxis: enoxaparin  ?Code Status: full  ?Family Communication:  ?Disposition: Status is: Inpatient ?Remains inpatient appropriate because: IV treatments required ?  ?Consultants:  ?Neurology  ?Procedures:  ? ?Antimicrobials:  ?Cefepime 5/9>> ?Vancomycin 5/9-5/10   ?Subjective: ?Pt remains obtunded and remains on IV precedex infusion  ?Objective: ?Vitals:  ? 09/20/21 0635 09/20/21 0700 09/20/21 0715 09/20/21 0723  ?BP: (!) 157/51 (!) 130/52    ?Pulse: 66 (!) 45 (!) 58   ?Resp: 18 13 19    ?Temp:    97.6 ?F (36.4 ?C)  ?TempSrc:    Axillary  ?SpO2: 98% 98% 99%   ?Weight:      ?Height:      ? ? ?Intake/Output Summary (Last 24 hours) at 09/20/2021 1118 ?Last data filed at 09/20/2021 0559 ?Gross per 24 hour  ?Intake 2284.97 ml  ?Output 200 ml  ?Net 2084.97 ml  ? ?Filed Weights  ? 09/19/21 0626 09/20/21 0523  ?Weight: 74.4 kg 75 kg  ? ?Examination: ? ?General exam: Pt is sedated on precedex infusion.   ?Respiratory system: Clear to auscultation. Respiratory effort normal. ?Cardiovascular system: normal S1 & S2 heard. No JVD, murmurs, rubs, gallops or clicks. No pedal edema. ?Gastrointestinal system: Abdomen is nondistended, soft and nontender. No organomegaly or masses felt. Normal bowel sounds heard. ?Central  nervous system: chemically sedated. No gross focal neurological deficits. ?Extremities: Symmetric 5 x 5 power. ?Skin: No rashes, lesions or ulcers. ?Psychiatry: Judgement and insight appear UTD. Mood & affect UTD.  ? ?Data Reviewed: I have personally reviewed following labs and imaging studies ? ?CBC: ?Recent Labs  ?Lab 09/19/21 ?0708 09/20/21 ?0412  ?WBC 14.2* 13.0*  ?NEUTROABS 12.1*  --   ?HGB 16.2* 13.7  ?HCT 47.7* 41.5  ?MCV 86.1 87.7  ?PLT 320 229  ? ? ?Basic Metabolic Panel: ?Recent Labs  ?Lab 09/19/21 ?0708 09/19/21 ?1652 09/19/21 ?2127 09/20/21 ?[id number]  ?NA 140  --  142 140  ?K 3.3*  --  2.9* 3.9  ?CL 104  --  107 112*  ?CO2 22  --  21* 22  ?GLUCOSE 138*  --  126* 135*  ?BUN 9  --  13 15  ?CREATININE 0.76  --  0.84 0.81  ?CALCIUM 9.4  --  9.2 8.6*  ?MG  --  1.6* 1.5* 3.0*  ? ? ?CBG: ?Recent Labs  ?Lab 09/19/21 ?0706  ?GLUCAP 149*  ? ? ?Recent Results (from the past 240 hour(s))  ?Culture, blood (routine x 2)     Status: None (Preliminary result)  ? Collection Time: 09/19/21  7:08 AM  ? Specimen: Left Antecubital; Blood  ?Result Value Ref Range Status  ? Specimen Description LEFT ANTECUBITAL  Final  ? Special Requests   Final  ?  BOTTLES DRAWN AEROBIC AND ANAEROBIC Blood Culture adequate volume  ? Culture   Final  ?  NO GROWTH < 24 HOURS ?Performed at [facility], [address], [place], [place] [address] ?  ? Report Status PENDING  Incomplete  ?Culture, blood (routine x 2)     Status: None (Preliminary result)  ? Collection Time: 09/19/21  7:08 AM  ? Specimen: BLOOD RIGHT HAND  ?Result Value Ref Range Status  ? Specimen Description BLOOD RIGHT HAND  Final  ? Special Requests   Final  ?  BOTTLES DRAWN AEROBIC ONLY Blood Culture results may not be optimal due to an inadequate volume of blood received in culture bottles  ? Culture   Final  ?  NO GROWTH < 24 HOURS ?Performed at [facility], [address] [place], [place] [address] ?  ? Report Status PENDING  Incomplete  ?MRSA Next Gen by PCR, Nasal     Status:  None  ? Collection Time: 09/19/21  3:24 PM  ? Specimen: Nasal Mucosa; Nasal Swab  ?Result Value Ref Range Status  ? MRSA by PCR Next Gen NOT DETECTED NOT DETECTED Final  ?  Comment: (NOTE) ?[employer]
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
## 2021-09-20 NOTE — Progress Notes (Signed)
Called [facility] IV team again @ [phone] & spoke with Jessica Bell regarding d/c PICC line order so that the IV team in route to [facility] could be made aware ?

## 2021-09-21 ENCOUNTER — Inpatient Hospital Stay (HOSPITAL_COMMUNITY): Payer: Medicare Other

## 2021-09-21 DIAGNOSIS — J449 Chronic obstructive pulmonary disease, unspecified: Secondary | ICD-10-CM

## 2021-09-21 LAB — BASIC METABOLIC PANEL
Anion gap: 4 — ABNORMAL LOW (ref 5–15)
BUN: 12 mg/dL (ref 8–23)
CO2: 18 mmol/L — ABNORMAL LOW (ref 22–32)
Calcium: 8.1 mg/dL — ABNORMAL LOW (ref 8.9–10.3)
Chloride: 116 mmol/L — ABNORMAL HIGH (ref 98–111)
Creatinine, Ser: 0.69 mg/dL (ref 0.44–1.00)
GFR, Estimated: >60 mL/min (ref >60)
Glucose, Bld: 91 mg/dL (ref 70–99)
Potassium: 3.5 mmol/L (ref 3.5–5.1)
Sodium: 138 mmol/L (ref 135–145)

## 2021-09-21 MED ORDER — AMLODIPINE BESYLATE 5 MG PO TABS
5.0000 mg | ORAL_TABLET | Freq: Every day | ORAL | Status: DC
Start: 2021-09-21 — End: 2021-09-21
  Administered 2021-09-21: 5 mg via ORAL
  Filled 2021-09-21: qty 1

## 2021-09-21 MED ORDER — OXYCODONE HCL ER 15 MG PO T12A
15.0000 mg | EXTENDED_RELEASE_TABLET | Freq: Two times a day (BID) | ORAL | Status: DC
  Administered 2021-09-21: 15 mg via ORAL
  Filled 2021-09-21: qty 1

## 2021-09-21 NOTE — Progress Notes (Signed)
Having echo at this time ?

## 2021-09-21 NOTE — Progress Notes (Signed)
During US carotid patient requested the tech to stop & that she refused the US carotid & echo exams to be performed, this RN explained she will have to leave AMA per MD if she continues to refuse care & without completion of recommended care the MD would not be able to discharge her & she stated "I'm fine with that.", notified patient's sig other Freida Busman) @ [phone] multiple times & it goes straight to VM each time, no other contacts listed on patient's record & she reported that she doesn't have her phone nor doesn't remember any phone numbers by memory, patient removed all of her ICU monitoring equipment & is refusing any further care at this time, patient report she's leaving AMA, obtained signed AMA paper from patient & placed in patient's chart, MD aware  ?

## 2021-09-21 NOTE — Discharge Summary (Signed)
Physician Discharge Summary  ?Eli HoseLaura S Massey ZOX:[id number] DOB: 31-Jan-1961 DOA: 09/19/2021 ? ?PCP: Joana ReamerMullis, Kiersten P, DO ? ?Admit date: 09/19/2021 ?Discharge date: 09/21/2021 ? ?Disposition:  HOME  ? ?PATIENT IS DISCHARGING AGAINST MEDICAL ADVICE.  PT HIGH RISK FOR READMISSION AND ADVERSE OUTCOME.  ? ?Recommendations for Outpatient Follow-up:  ?Follow up with PCP in 3-5 days to recheck BP and complete stroke workup outpatient  ?Please follow up with neurologist in 3-4 weeks  ? ?Discharge Condition: Guarded   ?CODE STATUS: Full  ?DIET: heart healthy recommended   ? ?Brief Hospitalization Summary: ?Please see all hospital notes, images, labs for full details of the hospitalization. ?61 year old female with a history of COPD, diastolic CHF, hypertension, opiate dependence and chronic pain syndrome, GERD, coronary artery disease, anxiety presenting with altered mental status.  The patient is unable to provide any history at this time secondary to her altered mental status.  History is obtained from speaking with the patient's fianc? at the bedside.  Apparently, the patient had been in her usual state of health until the patient was found to be minimally responsive by her fianc? around 5:30 am on 09/19/2021.  Fianc? states that the patient has been her usual self when he last saw her at 8:30 PM on 09/18/2021.  Notably, the patient was recently mated to the hospital from 08/23/2021 to 08/24/2021 with a similar presentation.  During that hospitalization, the patient had acute metabolic encephalopathy secondary to sepsis and UTI.  There is also a contribution from the patient's opioids and other hypnotic medications. Nevertheless, the patient's fianc? states that he has been administering the patient's opioids to her.  However he states that the patient administers all the other medications herself including her lorazepam and baclofen.  He states that usually when the patient takes her baclofen in addition to her other medications she  becomes encephalopathic.  There have been no complaints of fevers, chills, chest pain, cough, hemoptysis, nausea, vomiting, diarrhea, abdominal pain.  The patient continues to smoke up to 1 pack/day.  Notably, spouse states patient's amlodipine was discontinued "few weeks" ago.  In the ED, the patient had a temperature of 100.6 ?F.  She was hemodynamically stable.  In fact, the patient was hypertensive up to 194/76.  Oxygen saturation was 99% on room air.  BMP showed sodium 140, potassium 3.3, bicarbonate 22, serum creatinine 0.76.  LFTs were unremarkable.  WBC 14.9, hemoglobin 16.2, platelets 220,000.  CT of the brain shows new suspected vasogenic edema in the bilateral parietal temporal lobes and right cerebellar hemisphere.  MRI of the brain showed abnormal edema in the bilateral parieto-occipital regions with edema also in the bilateral cerebellar hemispheres.  This was suspicious for PRES. there is also a few small foci of restricted diffusion in the right cerebellum which may be related to PRES and or superimposed cerebellar infarcts.  Chest x-ray was negative for infiltrates.  EKG showed sinus rhythm and nonspecific ST-T wave changes.  UDS showed positive opiates.  Lactic acid 2.9.  The patient was given labetalol for her elevated BP.  She was also given Ativan 2 mg IM, Haldol 5 mg IV, Dilaudid 1 mg IV for her agitation. ? ?09/21/2021:  Pt says she is going home, she refused to complete stroke work up. She refused to complete the doppler US studies ordered. She is leaving against medical advice. I told her I would discharge her later after I had results of the stroke work up completed but she refused to allow them to  do the [place] studies and wants to leave now and willing to sign out AMA.  She has decisional capacity and she was counseled and says her daughter is visiting her from [place] and she wants to go be with her now and not waiting any longer for any tests. She verbalized understanding of the risks  of leaving AMA and she says she has no problem doing this.    ? ?HOSPITAL COURSE BY PROBLEM LIST  ? ?Assessment and Plan: ?* Acute metabolic encephalopathy ?RESOLVED: patient back to her baseline mental status ?Pt discharging against medical advice.  ?Multifactorial including PRES, opioid and hypnotic medications ?-infection ruled out ?-UA negative for pyuria ?-Chest x-ray negative for infiltrates ?-Follow blood cultures: NGTD ?-B12 - 374  ?-TSH - 3.691 ?-neurology consultation appreciated ?-EEG: encephalopathy ? ?Tobacco abuse ?Will offer nicotine patch ?Pt was counseled at length about cessation.  ? ?Opioid dependence (HCC) ?PDMP reviewed ?Oxycodone 10 mg, #90--refilled 08/23/21 ?Oxy ER 15 mg, # 60--refilled 08/23/21 and 08/12/21 ?Clonazepam 0.5 mg, #7--filled 08/29/21 ? ?SIRS (systemic inflammatory response syndrome) (HCC) ?Presented with tachypnea, leukocytosis, and fever 100.6 ?F ?Follow blood cultures:  No growth to date ?Chest x-ray negative for infiltrates ?UA negative for pyuria ?Empiric vancomycin and cefepime ?Lactic acid peaked at 2.9 ?Continue IV fluids ?DC vanc now that MRSA screen negative ?DC abx with reassuring procalcitonin  ? ?PRES (posterior reversible encephalopathy syndrome) ?Consult neurology appreciated ?- goals BP<140/90  ?- Pt refuses to complete stroke work up and leaving against medical advice  ?-Pt admitted to me that she was not taking her BP meds.  ?-Pt was strongly advised to take her BP meds ? ?Chronic diastolic CHF (congestive heart failure) (HCC) ?Clinically euvolemic ?05/11/2021 echo EF 50-55%, G1DD Distal septal HK ?Restarted carvedilol ? ?COPD (chronic obstructive pulmonary disease) (HCC) ?Stable on room air ?As needed bronchodilators ? ?Essential hypertension ?Resumed all home BP meds ?Pt is leaving against medical advice today  ?I counseled her on the risks of leaving including death and she verbalized understanding ? ?Dyslipidemia ?Continue home statin  ? ?CAD S/P percutaneous  coronary angioplasty ?No chest pain presently ?Personally reviewed EKG--sinus rhythm, non specific ST-T wave change ?Continue Plavix and statin when able to take orals again ? ?Discharge Diagnoses:  ?Principal Problem: ?  Acute metabolic encephalopathy ?Active Problems: ?  CAD S/P percutaneous coronary angioplasty ?  Dyslipidemia ?  Essential hypertension ?  COPD (chronic obstructive pulmonary disease) (HCC) ?  Chronic diastolic CHF (congestive heart failure) (HCC) ?  PRES (posterior reversible encephalopathy syndrome) ?  SIRS (systemic inflammatory response syndrome) (HCC) ?  Opioid dependence (HCC) ?  Tobacco abuse ? ? ?Discharge Instructions: ? ?Allergies as of 09/21/2021   ? ?   Reactions  ? Amoxicillin-pot Clavulanate Anaphylaxis, Other (See Comments)  ? N/V/D  ? Seasonal Ic [cholestatin]   ? Per pt not allergic to medication-has seasonal allergies  ? ?  ? ?  ?Medication List  ?  ? ?STOP taking these medications   ? ?baclofen 10 MG tablet ?Commonly known as: LIORESAL ?  ? ?  ? ?TAKE these medications   ? ?albuterol 108 (90 Base) MCG/ACT inhaler ?Commonly known as: VENTOLIN HFA ?Inhale into the lungs. ?  ?atorvastatin 80 MG tablet ?Commonly known as: LIPITOR ?Take 80 mg by mouth daily. ?  ?budesonide-formoterol 160-4.5 MCG/ACT inhaler ?Commonly known as: Symbicort ?Inhale 1 puff into the lungs in the morning and at bedtime. ?  ?carvedilol 12.5 MG tablet ?Commonly known as: COREG ?Take 0.5 tablets (  6.25 mg total) by mouth 2 (two) times daily. ?  ?clonazePAM 0.5 MG tablet ?Commonly known as: KLONOPIN ?Take 0.5 mg by mouth daily as needed for anxiety. ?  ?clopidogrel 75 MG tablet ?Commonly known as: PLAVIX ?Take 1 tablet (75 mg total) by mouth daily. ?  ?dicyclomine 10 MG capsule ?Commonly known as: BENTYL ?Take 10 mg by mouth 3 (three) times daily as needed for spasms. ?  ?ferrous gluconate 324 MG tablet ?Commonly known as: FERGON ?Take 1 tablet by mouth daily. ?  ?fluticasone 50 MCG/ACT nasal spray ?Commonly known  as: FLONASE ?Place 1 spray into both nostrils daily. ?  ?furosemide 20 MG tablet ?Commonly known as: LASIX ?Take 20 mg by mouth daily as needed. ?  ?gabapentin 300 MG capsule ?Commonly known as: Musician

## 2021-09-21 NOTE — Progress Notes (Signed)
US carotid in progress now ?

## 2021-09-21 NOTE — Plan of Care (Signed)
Patient left AMA.

## 2021-09-21 NOTE — Progress Notes (Signed)
Patient left AMA with her sig other & daughter ?

## 2021-09-21 NOTE — Progress Notes (Signed)
Patient continues to ask for more pain medication, she is getting her PRN Q6H pain medication as ordered & received last dose @ 6:02AM this AM, she turns & repositions herself, sat on the side of the bed with this RN, eager to d/c home, known to abuse Rx opiates at home, MD aware & notified  ?

## 2021-09-24 LAB — CULTURE, BLOOD (ROUTINE X 2)
Culture: NO GROWTH
Culture: NO GROWTH
Special Requests: ADEQUATE

## 2021-09-28 ENCOUNTER — Ambulatory Visit (INDEPENDENT_AMBULATORY_CARE_PROVIDER_SITE_OTHER): Payer: Medicare Other | Admitting: Family Medicine

## 2021-09-28 ENCOUNTER — Encounter: Payer: Self-pay | Admitting: Family Medicine

## 2021-09-28 VITALS — BP 147/87 | HR 71 | Temp 98.2°F

## 2021-09-28 DIAGNOSIS — G894 Chronic pain syndrome: Secondary | ICD-10-CM | POA: Diagnosis not present

## 2021-09-28 DIAGNOSIS — E039 Hypothyroidism, unspecified: Secondary | ICD-10-CM | POA: Insufficient documentation

## 2021-09-28 DIAGNOSIS — R6520 Severe sepsis without septic shock: Secondary | ICD-10-CM | POA: Diagnosis not present

## 2021-09-28 DIAGNOSIS — N179 Acute kidney failure, unspecified: Secondary | ICD-10-CM | POA: Diagnosis not present

## 2021-09-28 DIAGNOSIS — A419 Sepsis, unspecified organism: Secondary | ICD-10-CM | POA: Diagnosis not present

## 2021-09-28 DIAGNOSIS — F411 Generalized anxiety disorder: Secondary | ICD-10-CM

## 2021-09-28 LAB — POCT URINALYSIS DIPSTICK
Protein, UA: POSITIVE — AB
Spec Grav, UA: 1.02 (ref 1.010–1.025)
pH, UA: 6.5 (ref 5.0–8.0)

## 2021-09-28 MED ORDER — CLONAZEPAM 0.5 MG PO TABS
0.5000 mg | ORAL_TABLET | Freq: Every day | ORAL | 0 refills | Status: DC | PRN
Start: 2021-09-28 — End: 2023-06-09

## 2021-09-28 MED ORDER — OXYCODONE HCL 10 MG PO TABS
5.0000 mg | ORAL_TABLET | Freq: Three times a day (TID) | ORAL | 0 refills | Status: DC | PRN
Start: 2021-09-28 — End: 2023-06-16

## 2021-09-28 MED ORDER — OXYCONTIN 15 MG PO T12A: 15.0000 mg | EXTENDED_RELEASE_TABLET | Freq: Two times a day (BID) | ORAL | 0 refills | Status: AC

## 2021-09-28 NOTE — Progress Notes (Addendum)
Subjective:  Patient ID: Jessica Bell, female    DOB: 1960-08-03  Age: 61 y.o. MRN: [id number]  CC: Chief Complaint  Patient presents with   Establish Care    Pt establishing care.     HPI:  61 year old female with an extensive past medical history presents to establish care.  Patient has chronic pain/chronic pain syndrome.  Has been managed by her primary care physician most recently.  She is on OxyContin 15 mg twice daily and oxycodone 10 mg 3 times daily as needed for breakthrough pain.  Patient was recently admitted for encephalopathy.  Left against medical vice before complete work-up could be obtained.  MRI findings were concerning for press.  I believe that opiate and sedative medication (Klonopin) played a role.  Patient was admitted previousl in April for sepsis secondary to UTI.  Patient is concerned and would like her urine checked today.  She denies any current urinary symptoms.  However given her recent admissions she wants to make sure that her urine is clear.  Patient is currently most concerned about getting her medications refilled in particular her Klonopin as well as her pain medication.  I have advised her that she will need to see pain management.  I will not prescribe chronic pain medication long-term.  Patient states that she has an appointment with pain management in 3 weeks.  Patient inquired about referral to see if this could be done in a quicker fashion.  Patient has a cardiac history and states that she is followed by a [facility] physician at her former primary care's office.  No reports of chest pain at this time.  Patient continues to smoke and has underlying COPD.  Patient has underlying hypertension.  BP mildly elevated here today.  She is on Lasix, and losartan.  Lipids have been fairly well controlled.  Most recent LDL was 79.  She is on atorvastatin 80.  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) 03/20/2017   Antiplatelet or antithrombotic long-term use 03/20/2017   Anxiety, generalized 01/05/2016   CAD S/P percutaneous coronary angioplasty 01/05/2016   Dyslipidemia 01/05/2016   PTSD (post-traumatic stress disorder) 01/05/2016   Chronic pain syndrome 11/09/2015   Essential hypertension 11/09/2015    Social Hx   Social History   Socioeconomic History   Marital status: Single    Spouse name: Not on file   Number of children: 3   Years of education: Not on file   Highest education level: Not on file  Occupational History   Occupation: disabled  Tobacco Use   Smoking status: Some Days    Packs/day: 0.50    Years: 30.00    Pack years: 15.00    Types: Cigarettes   Smokeless tobacco: Never  Vaping Use   Vaping Use: Never used  Substance and Sexual Activity   Alcohol use: Yes    Comment: rare   Drug use: Not Currently   Sexual activity: Not on file  Other Topics Concern   Not  on file  Social History Narrative   Not on file   Social Determinants of Health   Financial Resource Strain: Not on file  Food Insecurity: Not on file  Transportation Needs: Not on file  Physical Activity: Not on file  Stress: Not on file  Social Connections: Not on file    Review of Systems Per HPI  Objective:  BP (!) 147/87   Pulse 71   Temp 98.2 F (36.8 C)   SpO2 96%      09/28/2021   11:34 AM 09/21/2021    6:00 AM 09/21/2021    1:15 AM  BP/Weight  Systolic BP 147 167 168  Diastolic BP 87 70 45     Physical Exam Constitutional:      General: She is not in acute distress.    Appearance: Normal appearance.  HENT:     Head: Normocephalic and atraumatic.  Eyes:     General:        Right eye: No discharge.        Left eye: No discharge.     Conjunctiva/sclera: Conjunctivae normal.  Cardiovascular:     Rate and Rhythm: Normal rate and regular rhythm.  Pulmonary:     Effort: Pulmonary effort is normal.     Breath sounds: Normal breath sounds. No wheezing or rales.  Abdominal:     General: There is no distension.     Palpations: Abdomen is soft.     Tenderness: There is no abdominal tenderness.  Neurological:     Mental Status: She is alert.  Psychiatric:     Comments: Rapid speech.   Appears anxious.  Psychomotor agitation noted.    Lab Results  Component Value Date   WBC 13.0 (H) 09/20/2021   HGB 13.7 09/20/2021   HCT 41.5 09/20/2021   PLT 229 09/20/2021   GLUCOSE 91 09/21/2021   CHOL 147 09/20/2021   TRIG 123 09/20/2021   HDL 43 09/20/2021   LDLCALC 79 09/20/2021   ALT 19 09/20/2021   AST 28 09/20/2021   NA 138 09/21/2021   K 3.5 09/21/2021   CL 116 (H) 09/21/2021   CREATININE 0.69 09/21/2021   BUN 12 09/21/2021   CO2 18 (L) 09/21/2021   TSH 3.691 09/19/2021     Assessment & Plan:   Problem List Items Addressed This Visit       Genitourinary   Sepsis with acute renal failure without septic shock (HCC)    Recent sepsis.  Urinalysis performed today.  No evidence of UTI.       Relevant Orders   POCT Urinalysis Dipstick (Completed)     Other   Anxiety, generalized    I have refilled her Klonopin.  Recommended intermittent and cautious use especially in the setting of chronic opioids.       Chronic pain syndrome - Primary    [place] controlled substance database was reviewed.  Patient has had recent refills of her medication.  She is currently out of her medication.  I am concerned about these meds especially in the setting of recent admission for  encephalopathy.  I have given her a short supply.  I have informed her that I will not fill these medications long-term.  She has to see pain management.  Patient acknowledges and is in agreement.       Relevant Medications   Oxycodone HCl 10 MG TABS   OXYCONTIN 15 MG 12 hr tablet   clonazePAM (KLONOPIN) 0.5 MG tablet   Other  Relevant Orders   Ambulatory referral to Pain Clinic    Meds ordered this encounter  Medications   Oxycodone HCl 10 MG TABS    Sig: Take 0.5-1 tablets (5-10 mg total) by mouth 3 (three) times daily as needed (breakthrough pain).    Dispense:  30 tablet    Refill:  0   OXYCONTIN 15 MG 12 hr tablet    Sig: Take 1 tablet (15 mg total) by mouth 2 (two) times daily.    Dispense:  60 tablet    Refill:  0   clonazePAM (KLONOPIN) 0.5 MG tablet    Sig: Take 1 tablet (0.5 mg total) by mouth daily as needed for anxiety.    Dispense:  20 tablet    Refill:  0    Follow-up:  Return in about 3 months (around 12/29/2021).  Everlene OtherJayce Milus Fritze DO [facility]

## 2021-09-28 NOTE — Assessment & Plan Note (Signed)
Recent sepsis.  Urinalysis performed today.  No evidence of UTI.

## 2021-09-28 NOTE — Assessment & Plan Note (Signed)
I have refilled her Klonopin.  Recommended intermittent and cautious use especially in the setting of chronic opioids.

## 2021-09-28 NOTE — Assessment & Plan Note (Addendum)
[place] controlled substance database was reviewed.  Patient has had recent refills of her medication.  She is currently out of her medication.  I am concerned about these meds especially in the setting of recent admission for encephalopathy.  I have given her a short supply.  I have informed her that I will not fill these medications long-term.  She has to see pain management.  Patient acknowledges and is in agreement.

## 2021-09-28 NOTE — Patient Instructions (Signed)
No evidence of UTI today.  I have refilled your medications. I have placed a referral to pain management. I will not provide additional refills for your pain medication. This has to come from pain management.  Follow up in 3 months  Take care  Dr. Adriana Simas

## 2021-09-29 ENCOUNTER — Telehealth: Payer: Self-pay | Admitting: Family Medicine

## 2021-09-29 ENCOUNTER — Telehealth: Payer: Self-pay | Admitting: *Deleted

## 2021-09-29 NOTE — Telephone Encounter (Signed)
Pt left voicemail on nurse line earlier this morning in regards to Oxy 10 mg. Pt states that PCP sent in #30 when it needed to be #90. Per Dr.Cook; Oxy 10 mg is for break through pain ONLY. Additional pain meds will need to come from pain management (referral placed yesterday and is pending med review). Contacted patient and informed her that Oxy 10 is for breakthrough pain ONLY. Pt verbalized understanding.

## 2021-10-02 ENCOUNTER — Emergency Department (HOSPITAL_COMMUNITY)
Admission: EM | Admit: 2021-10-02 | Discharge: 2021-10-03 | Disposition: A | Discharge status: Home / Self Care | Payer: Medicare Other | Attending: Emergency Medicine | Admitting: Emergency Medicine

## 2021-10-02 ENCOUNTER — Telehealth: Payer: Self-pay | Admitting: Family Medicine

## 2021-10-02 ENCOUNTER — Telehealth: Payer: Self-pay | Admitting: *Deleted

## 2021-10-02 ENCOUNTER — Encounter (HOSPITAL_COMMUNITY): Payer: Self-pay | Admitting: Emergency Medicine

## 2021-10-02 DIAGNOSIS — R600 Localized edema: Secondary | ICD-10-CM | POA: Diagnosis not present

## 2021-10-02 DIAGNOSIS — M7989 Other specified soft tissue disorders: Secondary | ICD-10-CM | POA: Diagnosis present

## 2021-10-02 DIAGNOSIS — Z7901 Long term (current) use of anticoagulants: Secondary | ICD-10-CM | POA: Insufficient documentation

## 2021-10-02 LAB — BRAIN NATRIURETIC PEPTIDE: B Natriuretic Peptide: 71 pg/mL (ref 0.0–100.0)

## 2021-10-02 LAB — BASIC METABOLIC PANEL
Anion gap: 8 (ref 5–15)
BUN: 14 mg/dL (ref 8–23)
CO2: 32 mmol/L (ref 22–32)
Calcium: 8.8 mg/dL — ABNORMAL LOW (ref 8.9–10.3)
Chloride: 99 mmol/L (ref 98–111)
Creatinine, Ser: 1.08 mg/dL — ABNORMAL HIGH (ref 0.44–1.00)
GFR, Estimated: 58 mL/min — ABNORMAL LOW (ref >60)
Glucose, Bld: 115 mg/dL — ABNORMAL HIGH (ref 70–99)
Potassium: 3.9 mmol/L (ref 3.5–5.1)
Sodium: 139 mmol/L (ref 135–145)

## 2021-10-02 LAB — CBC
HCT: 35.3 % — ABNORMAL LOW (ref 36.0–46.0)
Hemoglobin: 11.7 g/dL — ABNORMAL LOW (ref 12.0–15.0)
MCH: 29.7 pg (ref 26.0–34.0)
MCHC: 33.1 g/dL (ref 30.0–36.0)
MCV: 89.6 fL (ref 80.0–100.0)
Platelets: 266 10*3/uL (ref 150–400)
RBC: 3.94 MIL/uL (ref 3.87–5.11)
RDW: 14.9 % (ref 11.5–15.5)
WBC: 9.3 10*3/uL (ref 4.0–10.5)
nRBC: 0 % (ref 0.0–0.2)

## 2021-10-02 NOTE — ED Triage Notes (Signed)
Pt arrives POV from home c/o bilateral leg swelling and pain. Pt states she take OxyContin 15mg  twice a day and 10mg  Oxycodone for break though pain. Last took pain medication about 6hr PTA. Pt states she does have CHF and has been taking her lasix as ordered.

## 2021-10-02 NOTE — Telephone Encounter (Signed)
Pt calling in to see if PCP will send in 30 or 40 more oxycodone 10 mg. Pt states she had to take one whole Oxy 10 due to Oxycontin not coming in until late Saturday. Pt is also requesting refill on Gabapentin 300 mg; takes 2 in the morning and 2 in the afternoon. Please advise. Thank you  Pain management referral states PLEASE ALLOW 2-4 Gregory REVIEW 09/29/21

## 2021-10-02 NOTE — Telephone Encounter (Signed)
Patient called and stated that her legs, ankles, and feet were so swollen and very painful. Advised pt that I spoke with Dr.Cook and he says for her to go to the emergency room. Pt verbalized understanding.

## 2021-10-03 ENCOUNTER — Telehealth: Payer: Self-pay | Admitting: *Deleted

## 2021-10-03 DIAGNOSIS — R600 Localized edema: Secondary | ICD-10-CM | POA: Diagnosis not present

## 2021-10-03 MED ORDER — FUROSEMIDE 40 MG PO TABS
40.0000 mg | ORAL_TABLET | Freq: Once | ORAL | Status: AC
  Administered 2021-10-03: 40 mg via ORAL
  Filled 2021-10-03: qty 1

## 2021-10-03 MED ORDER — FUROSEMIDE 20 MG PO TABS: 20.0000 mg | ORAL_TABLET | Freq: Every day | ORAL | 0 refills | Status: DC | PRN

## 2021-10-03 NOTE — Telephone Encounter (Signed)
LMTRC

## 2021-10-03 NOTE — Telephone Encounter (Signed)
Patient notified and verbalized understanding. 

## 2021-10-03 NOTE — ED Provider Notes (Signed)
[facility] EMERGENCY DEPARTMENT Provider Note   CSN: [id number] Arrival date & time: 10/02/21  2150     History  Chief Complaint  Patient presents with   Leg Swelling    Jessica Bell is a 61 y.o. female.  61 year old female multiple narcotics at home for chronic pain on a pain contract presents the ER today with worsening lower extremity swelling and associated pain.  Denies any use of Lasix even though it is in her chart.  No chest pain or shortness of breath.  No injuries.  No rashes.  No other associated symptoms. Called her PCP who sent her to the ED for evaluation.        Home Medications Prior to Admission medications   Medication Sig Start Date End Date Taking? Authorizing Provider  furosemide (LASIX) 20 MG tablet Take 1 tablet (20 mg total) by mouth daily as needed for edema. 10/03/21  Yes Stefanie Hodgens, Barbara Cower, MD  albuterol (PROVENTIL HFA;VENTOLIN HFA) 108 (90 Base) MCG/ACT inhaler Inhale into the lungs.    [provider]  atorvastatin (LIPITOR) 80 MG tablet Take 80 mg by mouth daily. 02/07/21   [provider]  budesonide-formoterol (SYMBICORT) 160-4.5 MCG/ACT inhaler Inhale 1 puff into the lungs in the morning and at bedtime. 05/23/20   Robinson, [place] N, PA-C  carvedilol (COREG) 12.5 MG tablet Take 0.5 tablets (6.25 mg total) by mouth 2 (two) times daily. 08/25/21   Johnson, Clanford L, MD  clonazePAM (KLONOPIN) 0.5 MG tablet Take 1 tablet (0.5 mg total) by mouth daily as needed for anxiety. 09/28/21   Tommie Sams, DO  clopidogrel (PLAVIX) 75 MG tablet Take 1 tablet (75 mg total) by mouth daily. 03/10/21   Erick Blinks, MD  dicyclomine (BENTYL) 10 MG capsule Take 10 mg by mouth 3 (three) times daily as needed for spasms.    [provider]  ferrous gluconate (FERGON) 324 MG tablet Take 1 tablet by mouth daily. 07/03/21   [provider]  fluticasone (FLONASE) 50 MCG/ACT nasal spray Place 1 spray into both nostrils daily. 12/25/15    [provider]  gabapentin (NEURONTIN) 300 MG capsule Take 2 capsules by mouth 2 (two) times daily. 04/17/21   [provider]  hydrOXYzine (ATARAX) 50 MG tablet Take 50-100 mg by mouth 3 (three) times daily as needed. 08/23/21   [provider]  levothyroxine (SYNTHROID, LEVOTHROID) 75 MCG tablet Take 100 mcg by mouth daily. 02/14/17   [provider]  losartan (COZAAR) 100 MG tablet Take 0.5 tablets (50 mg total) by mouth daily. 08/26/21   Johnson, Clanford L, MD  nitroGLYCERIN (NITROSTAT) 0.4 MG SL tablet Place 0.4 mg under the tongue every 5 (five) minutes as needed for chest pain.    [provider]  omeprazole (PRILOSEC) 20 MG capsule Take 1 capsule by mouth 2 (two) times daily.    [provider]  Oxycodone HCl 10 MG TABS Take 0.5-1 tablets (5-10 mg total) by mouth 3 (three) times daily as needed (breakthrough pain). 09/28/21   Cook, Jayce G, DO  OXYCONTIN 15 MG 12 hr tablet Take 1 tablet (15 mg total) by mouth 2 (two) times daily. 09/28/21 10/28/21  Tommie Sams, DO      Allergies    Amoxicillin-pot clavulanate and Seasonal ic [cholestatin]    Review of Systems   Review of Systems  Physical Exam Updated Vital Signs BP 128/68   Pulse 78   Temp 97.8 F (36.6 C) (Oral)   Resp  18   Ht 5\' 3"  (1.6 m)   Wt 75 kg   SpO2 95%   BMI 29.29 kg/m  Physical Exam Vitals and nursing note reviewed.  Constitutional:      Appearance: She is well-developed.  HENT:     Head: Normocephalic and atraumatic.  Eyes:     Pupils: Pupils are equal, round, and reactive to light.  Cardiovascular:     Rate and Rhythm: Normal rate and regular rhythm.  Pulmonary:     Effort: No respiratory distress.     Breath sounds: No stridor.  Abdominal:     General: Abdomen is flat. There is no distension.  Musculoskeletal:     Cervical back: Normal range of motion.  Skin:    General: Skin is warm and dry.  Neurological:     General: No focal deficit  present.     Mental Status: She is alert.    ED Results / Procedures / Treatments   Labs (all labs ordered are listed, but only abnormal results are displayed) Labs Reviewed  CBC - Abnormal; Notable for the following components:      Result Value   Hemoglobin 11.7 (*)    HCT 35.3 (*)    All other components within normal limits  BASIC METABOLIC PANEL - Abnormal; Notable for the following components:   Glucose, Bld 115 (*)    Creatinine, Ser 1.08 (*)    Calcium 8.8 (*)    GFR, Estimated 58 (*)    All other components within normal limits  BRAIN NATRIURETIC PEPTIDE    EKG None  Radiology No results found.  Procedures Procedures    Medications Ordered in ED Medications  furosemide (LASIX) tablet 40 mg (40 mg Oral Given 10/03/21 0023)    ED Course/ Medical Decision Making/ A&P                           Medical Decision Making Amount and/or Complexity of Data Reviewed Labs: ordered.  Risk Prescription drug management.   Is not in florid heart failure or renal failure. No indication for imaging or hospitalization as the Lasix is working. Will give another Rx for same. Stable for dc w/ PCP follow up.    Final Clinical Impression(s) / ED Diagnoses Final diagnoses:  Leg edema    Rx / DC Orders ED Discharge Orders          Ordered    furosemide (LASIX) 20 MG tablet  Daily PRN        10/03/21 0238              Ashlei Chinchilla, 10/05/21, MD 10/03/21 [phone]

## 2021-10-03 NOTE — Telephone Encounter (Signed)
Patient states the lasix the ER gave her is the exact same lasix she had at home so she didn't know if she needed to increase or try something else

## 2021-10-03 NOTE — Telephone Encounter (Signed)
Patient says she went to the ER last night for the swelling in feet and ankles. She was told it was her CHF and she has an appointment with cardiology on 10/19/21. She says they told her that they were going to send in some extra strength Lasix to help get the fluid off then she wouldn't be in so much pain. Patient wants to know if you would want to send in a stronger lasix.  Please advise. Thank you.

## 2021-10-03 NOTE — Telephone Encounter (Signed)
Cook, Jayce G, DO    Increase to 40 mg daily. Also send in 20 mEq of Kdur to be taken when she takes the lasix.

## 2021-10-04 ENCOUNTER — Telehealth: Payer: Self-pay | Admitting: Family Medicine

## 2021-10-04 ENCOUNTER — Other Ambulatory Visit: Payer: Self-pay

## 2021-10-04 MED ORDER — GABAPENTIN 300 MG PO CAPS: 600.0000 mg | ORAL_CAPSULE | Freq: Two times a day (BID) | ORAL | 0 refills | Status: DC

## 2021-10-04 NOTE — Telephone Encounter (Signed)
Refills sent to pharmacy. 

## 2021-10-04 NOTE — Telephone Encounter (Signed)
[facility] [address] [place] requesting refill on Gabapentin for patient. Please advise. Thank you

## 2021-10-13 ENCOUNTER — Other Ambulatory Visit: Payer: Self-pay

## 2021-10-13 ENCOUNTER — Emergency Department (HOSPITAL_COMMUNITY): Payer: Medicare Other

## 2021-10-13 ENCOUNTER — Inpatient Hospital Stay (HOSPITAL_COMMUNITY)
Admission: EM | Admit: 2021-10-13 | Discharge: 2021-10-16 | DRG: 070 | Disposition: A | Discharge status: Home Health | Payer: Medicare Other | Attending: Family Medicine | Admitting: Family Medicine

## 2021-10-13 ENCOUNTER — Encounter (HOSPITAL_COMMUNITY): Payer: Self-pay | Admitting: *Deleted

## 2021-10-13 DIAGNOSIS — G894 Chronic pain syndrome: Secondary | ICD-10-CM | POA: Diagnosis present

## 2021-10-13 DIAGNOSIS — E785 Hyperlipidemia, unspecified: Secondary | ICD-10-CM | POA: Diagnosis present

## 2021-10-13 DIAGNOSIS — I5032 Chronic diastolic (congestive) heart failure: Secondary | ICD-10-CM | POA: Diagnosis present

## 2021-10-13 DIAGNOSIS — Z7951 Long term (current) use of inhaled steroids: Secondary | ICD-10-CM | POA: Diagnosis not present

## 2021-10-13 DIAGNOSIS — Z79890 Hormone replacement therapy (postmenopausal): Secondary | ICD-10-CM | POA: Diagnosis not present

## 2021-10-13 DIAGNOSIS — Z7902 Long term (current) use of antithrombotics/antiplatelets: Secondary | ICD-10-CM | POA: Diagnosis not present

## 2021-10-13 DIAGNOSIS — F1120 Opioid dependence, uncomplicated: Secondary | ICD-10-CM | POA: Diagnosis present

## 2021-10-13 DIAGNOSIS — F419 Anxiety disorder, unspecified: Secondary | ICD-10-CM | POA: Diagnosis present

## 2021-10-13 DIAGNOSIS — I6783 Posterior reversible encephalopathy syndrome: Secondary | ICD-10-CM | POA: Diagnosis present

## 2021-10-13 DIAGNOSIS — Z96643 Presence of artificial hip joint, bilateral: Secondary | ICD-10-CM | POA: Diagnosis present

## 2021-10-13 DIAGNOSIS — G8929 Other chronic pain: Secondary | ICD-10-CM | POA: Diagnosis present

## 2021-10-13 DIAGNOSIS — I2510 Atherosclerotic heart disease of native coronary artery without angina pectoris: Secondary | ICD-10-CM | POA: Diagnosis present

## 2021-10-13 DIAGNOSIS — J449 Chronic obstructive pulmonary disease, unspecified: Secondary | ICD-10-CM | POA: Diagnosis present

## 2021-10-13 DIAGNOSIS — Z781 Physical restraint status: Secondary | ICD-10-CM

## 2021-10-13 DIAGNOSIS — G936 Cerebral edema: Secondary | ICD-10-CM | POA: Diagnosis present

## 2021-10-13 DIAGNOSIS — J45909 Unspecified asthma, uncomplicated: Secondary | ICD-10-CM | POA: Diagnosis present

## 2021-10-13 DIAGNOSIS — M1990 Unspecified osteoarthritis, unspecified site: Secondary | ICD-10-CM | POA: Diagnosis present

## 2021-10-13 DIAGNOSIS — F17210 Nicotine dependence, cigarettes, uncomplicated: Secondary | ICD-10-CM | POA: Diagnosis present

## 2021-10-13 DIAGNOSIS — I252 Old myocardial infarction: Secondary | ICD-10-CM

## 2021-10-13 DIAGNOSIS — E039 Hypothyroidism, unspecified: Secondary | ICD-10-CM | POA: Diagnosis present

## 2021-10-13 DIAGNOSIS — Z800 Family history of malignant neoplasm of digestive organs: Secondary | ICD-10-CM

## 2021-10-13 DIAGNOSIS — I110 Hypertensive heart disease with heart failure: Secondary | ICD-10-CM | POA: Diagnosis present

## 2021-10-13 DIAGNOSIS — Z886 Allergy status to analgesic agent status: Secondary | ICD-10-CM | POA: Diagnosis not present

## 2021-10-13 DIAGNOSIS — Z91199 Patient's noncompliance with other medical treatment and regimen due to unspecified reason: Secondary | ICD-10-CM

## 2021-10-13 DIAGNOSIS — Z880 Allergy status to penicillin: Secondary | ICD-10-CM | POA: Diagnosis not present

## 2021-10-13 DIAGNOSIS — G9341 Metabolic encephalopathy: Secondary | ICD-10-CM | POA: Diagnosis present

## 2021-10-13 DIAGNOSIS — Z8249 Family history of ischemic heart disease and other diseases of the circulatory system: Secondary | ICD-10-CM

## 2021-10-13 DIAGNOSIS — Z20822 Contact with and (suspected) exposure to covid-19: Secondary | ICD-10-CM | POA: Diagnosis present

## 2021-10-13 DIAGNOSIS — G9340 Encephalopathy, unspecified: Secondary | ICD-10-CM | POA: Diagnosis not present

## 2021-10-13 DIAGNOSIS — R4182 Altered mental status, unspecified: Secondary | ICD-10-CM | POA: Diagnosis present

## 2021-10-13 DIAGNOSIS — Z79899 Other long term (current) drug therapy: Secondary | ICD-10-CM

## 2021-10-13 DIAGNOSIS — I10 Essential (primary) hypertension: Secondary | ICD-10-CM | POA: Diagnosis present

## 2021-10-13 DIAGNOSIS — Z9861 Coronary angioplasty status: Secondary | ICD-10-CM

## 2021-10-13 LAB — RAPID URINE DRUG SCREEN, HOSP PERFORMED
Amphetamines: NOT DETECTED
Barbiturates: NOT DETECTED
Benzodiazepines: NOT DETECTED
Cocaine: NOT DETECTED
Opiates: POSITIVE — AB
Tetrahydrocannabinol: NOT DETECTED

## 2021-10-13 LAB — URINALYSIS, ROUTINE W REFLEX MICROSCOPIC
Bacteria, UA: NONE SEEN
Bilirubin Urine: NEGATIVE
Glucose, UA: NEGATIVE mg/dL
Ketones, ur: 5 mg/dL — AB
Leukocytes,Ua: NEGATIVE
Nitrite: NEGATIVE
Protein, ur: >=300 mg/dL — AB
RBC / HPF: >50 RBC/hpf — ABNORMAL HIGH (ref 0–5)
Specific Gravity, Urine: 1.023 (ref 1.005–1.030)
pH: 7 (ref 5.0–8.0)

## 2021-10-13 LAB — BLOOD GAS, ARTERIAL
Acid-Base Excess: 0.3 mmol/L (ref 0.0–2.0)
Bicarbonate: 23.5 mmol/L (ref 20.0–28.0)
Drawn by: 22179
FIO2: 21 %
O2 Saturation: 97 %
Patient temperature: 36.8
pCO2 arterial: 33 mmHg (ref 32–48)
pH, Arterial: 7.46 — ABNORMAL HIGH (ref 7.35–7.45)
pO2, Arterial: 78 mmHg — ABNORMAL LOW (ref 83–108)

## 2021-10-13 LAB — COMPREHENSIVE METABOLIC PANEL
ALT: 18 U/L (ref 0–44)
AST: 21 U/L (ref 15–41)
Albumin: 4.3 g/dL (ref 3.5–5.0)
Alkaline Phosphatase: 101 U/L (ref 38–126)
Anion gap: 13 (ref 5–15)
BUN: 9 mg/dL (ref 8–23)
CO2: 24 mmol/L (ref 22–32)
Calcium: 9.6 mg/dL (ref 8.9–10.3)
Chloride: 102 mmol/L (ref 98–111)
Creatinine, Ser: 0.7 mg/dL (ref 0.44–1.00)
GFR, Estimated: >60 mL/min (ref >60)
Glucose, Bld: 108 mg/dL — ABNORMAL HIGH (ref 70–99)
Potassium: 3.7 mmol/L (ref 3.5–5.1)
Sodium: 139 mmol/L (ref 135–145)
Total Bilirubin: 0.9 mg/dL (ref 0.3–1.2)
Total Protein: 7.7 g/dL (ref 6.5–8.1)

## 2021-10-13 LAB — CBC WITH DIFFERENTIAL/PLATELET
Abs Immature Granulocytes: 0.03 10*3/uL (ref 0.00–0.07)
Basophils Absolute: 0 10*3/uL (ref 0.0–0.1)
Basophils Relative: 0 %
Eosinophils Absolute: 0.1 10*3/uL (ref 0.0–0.5)
Eosinophils Relative: 1 %
HCT: 46 % (ref 36.0–46.0)
Hemoglobin: 15.7 g/dL — ABNORMAL HIGH (ref 12.0–15.0)
Immature Granulocytes: 0 %
Lymphocytes Relative: 12 %
Lymphs Abs: 1.3 10*3/uL (ref 0.7–4.0)
MCH: 29.6 pg (ref 26.0–34.0)
MCHC: 34.1 g/dL (ref 30.0–36.0)
MCV: 86.6 fL (ref 80.0–100.0)
Monocytes Absolute: 0.6 10*3/uL (ref 0.1–1.0)
Monocytes Relative: 5 %
Neutro Abs: 9.1 10*3/uL — ABNORMAL HIGH (ref 1.7–7.7)
Neutrophils Relative %: 82 %
Platelets: 356 10*3/uL (ref 150–400)
RBC: 5.31 MIL/uL — ABNORMAL HIGH (ref 3.87–5.11)
RDW: 14 % (ref 11.5–15.5)
WBC: 11.1 10*3/uL — ABNORMAL HIGH (ref 4.0–10.5)
nRBC: 0 % (ref 0.0–0.2)

## 2021-10-13 LAB — I-STAT CHEM 8, ED
BUN: 8 mg/dL (ref 8–23)
Calcium, Ion: 1.14 mmol/L — ABNORMAL LOW (ref 1.15–1.40)
Chloride: 103 mmol/L (ref 98–111)
Creatinine, Ser: 0.7 mg/dL (ref 0.44–1.00)
Glucose, Bld: 107 mg/dL — ABNORMAL HIGH (ref 70–99)
HCT: 47 % — ABNORMAL HIGH (ref 36.0–46.0)
Hemoglobin: 16 g/dL — ABNORMAL HIGH (ref 12.0–15.0)
Potassium: 3.7 mmol/L (ref 3.5–5.1)
Sodium: 138 mmol/L (ref 135–145)
TCO2: 26 mmol/L (ref 22–32)

## 2021-10-13 LAB — PROTIME-INR
INR: 0.9 (ref 0.8–1.2)
Prothrombin Time: 12.1 seconds (ref 11.4–15.2)

## 2021-10-13 LAB — RESP PANEL BY RT-PCR (FLU A&B, COVID) ARPGX2
Influenza A by PCR: NEGATIVE
Influenza B by PCR: NEGATIVE
SARS Coronavirus 2 by RT PCR: NEGATIVE

## 2021-10-13 LAB — LACTIC ACID, PLASMA
Lactic Acid, Venous: 1.9 mmol/L (ref 0.5–1.9)
Lactic Acid, Venous: 1.8 mmol/L (ref 0.5–1.9)

## 2021-10-13 LAB — ETHANOL: Alcohol, Ethyl (B): <10 mg/dL (ref <10)

## 2021-10-13 IMAGING — CT CT ABD-PELV W/ CM
2 of 5 series · 16 of 46 positions shown, 18 images · IV contrast (Omnipaque or Isovue)
Comparison: None Available.

CLINICAL DATA: Abdominal pain, acute, nonlocalized

EXAM:
CT ABDOMEN AND PELVIS WITH CONTRAST
TECHNIQUE: Multidetector CT imaging of the abdomen and pelvis was performed
using the standard protocol following bolus administration of
intravenous contrast.

[Series 3: axial (person_name) · axial · 0.88mm/px · z∈[+822,+1222]mm · 13 of 92 slices shown, 15 images]
[im 6/92  soft-tissue]
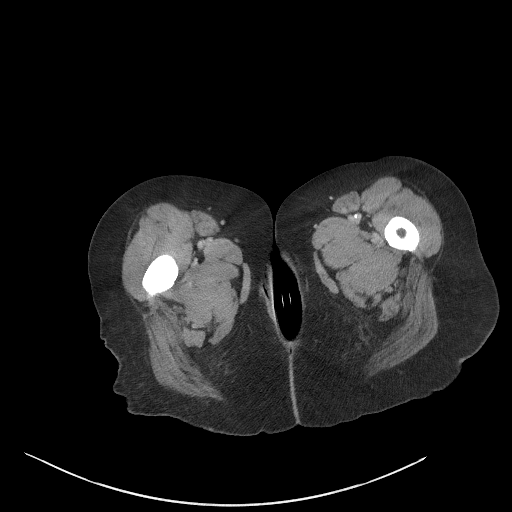
[im 6/92  bone]
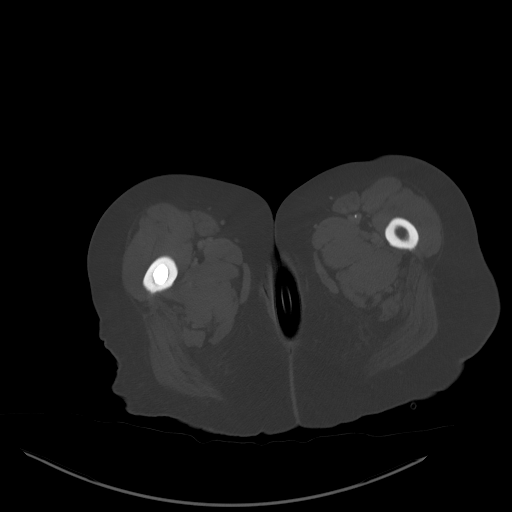
[im 11/92  soft-tissue]
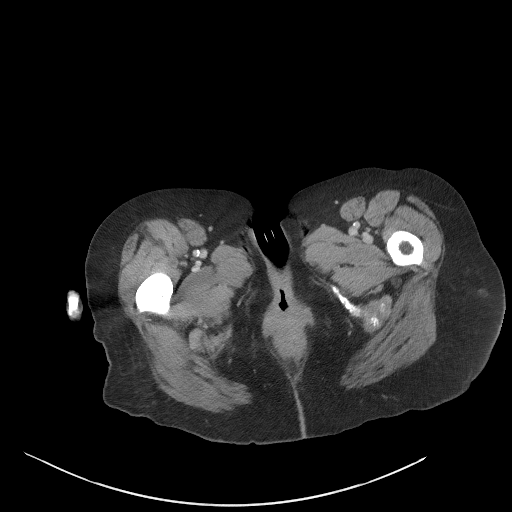
[im 21/92  soft-tissue]
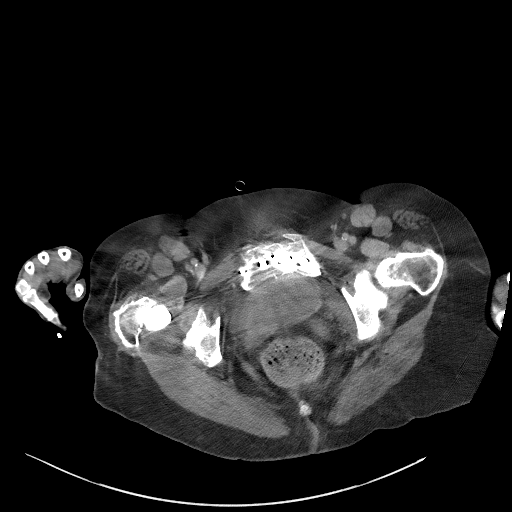
[im 26/92  soft-tissue]
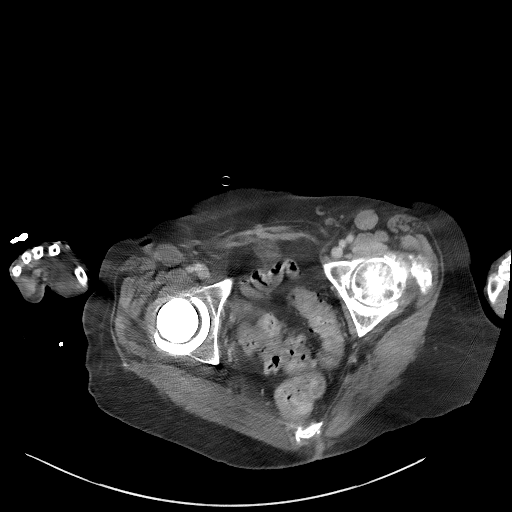
[im 31/92  soft-tissue]
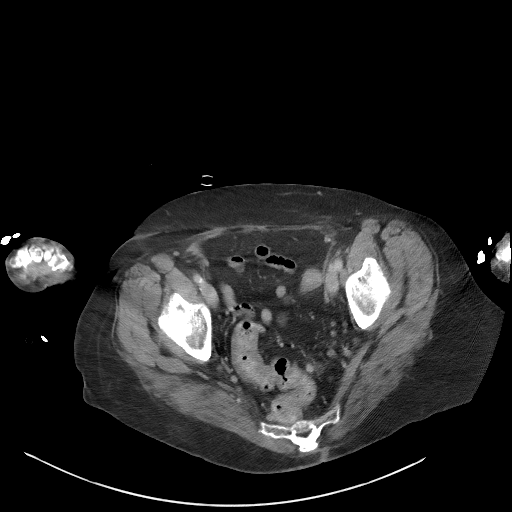
[im 41/92  soft-tissue]
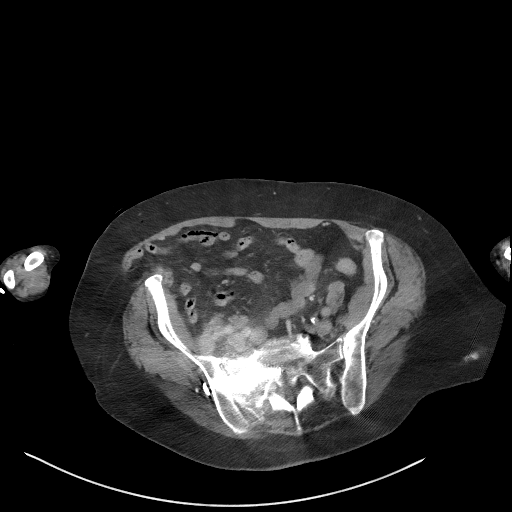
[im 46/92  soft-tissue]
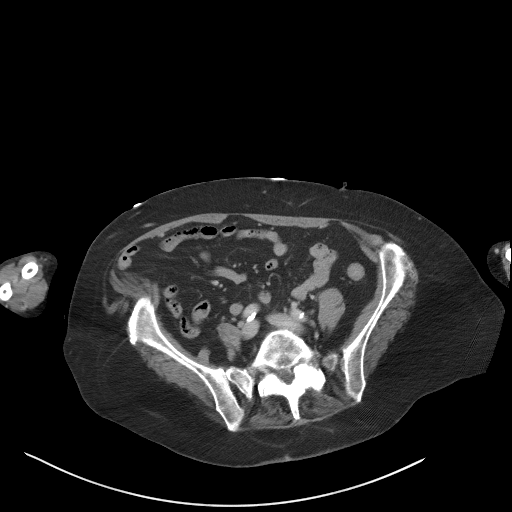
[im 51/92  soft-tissue]
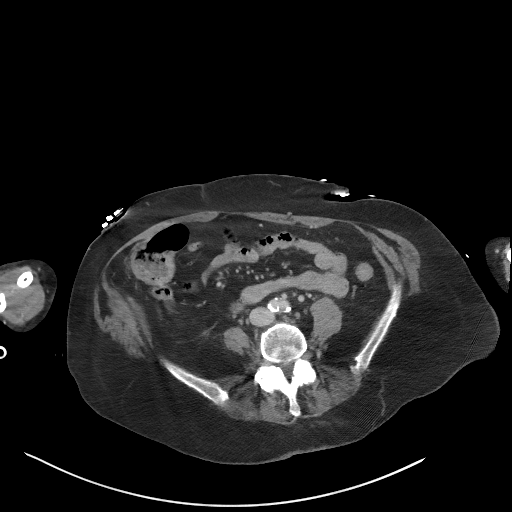
[im 61/92  soft-tissue]
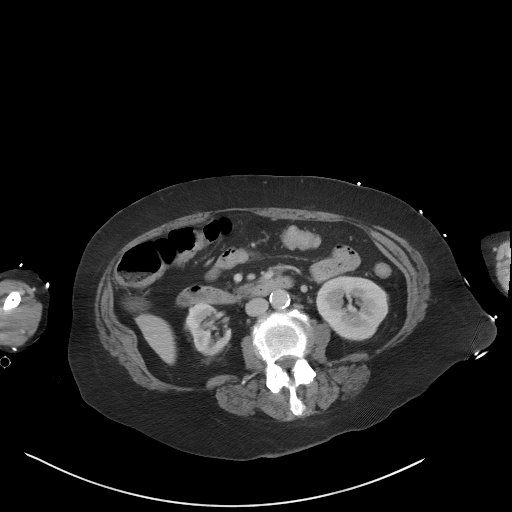
[im 61/92  bone]
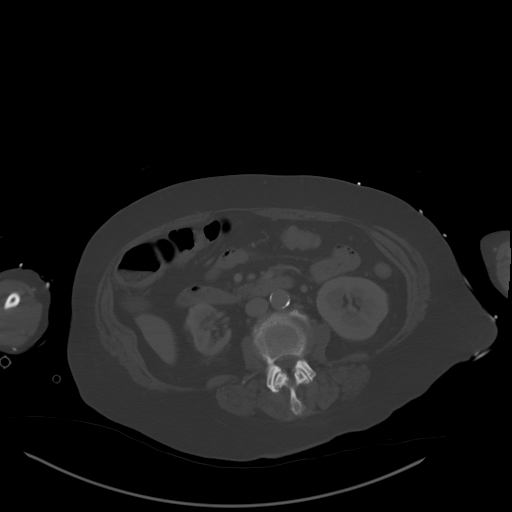
[im 66/92  soft-tissue]
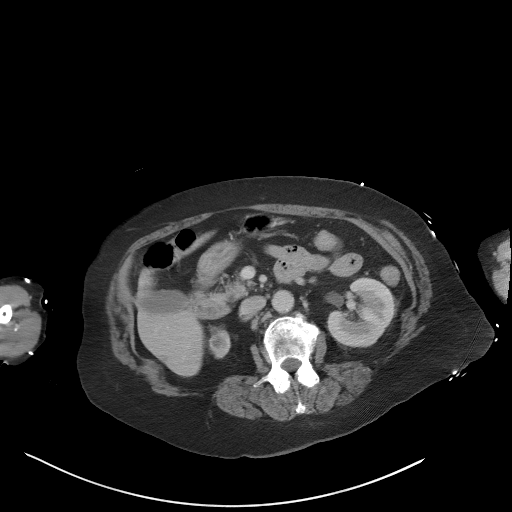
[im 71/92  soft-tissue]
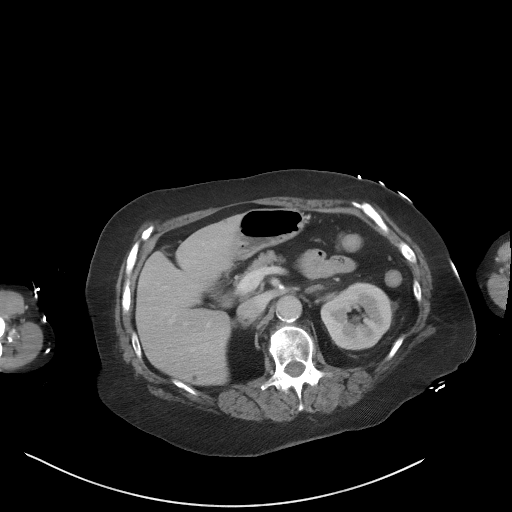
[im 81/92  soft-tissue]
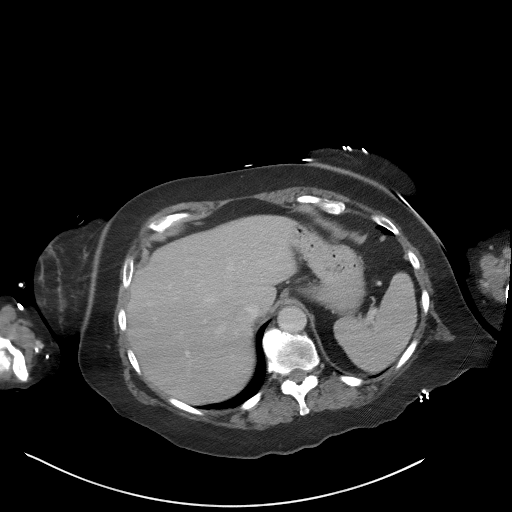
[im 86/92  soft-tissue]
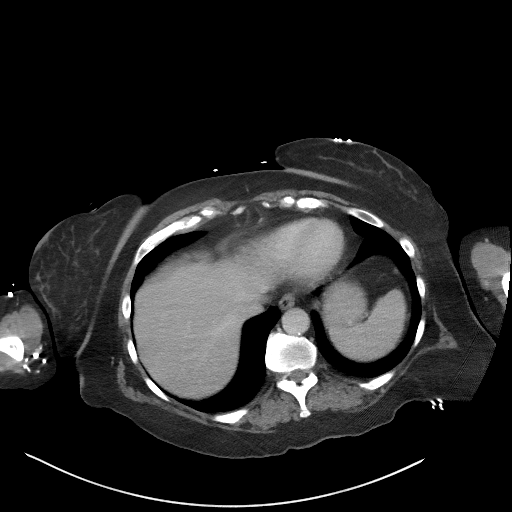

[Series 6: coronal (person_name) · coronal · 0.84mm/px · 3 of 98 slices shown]
[im 33/98  soft-tissue]
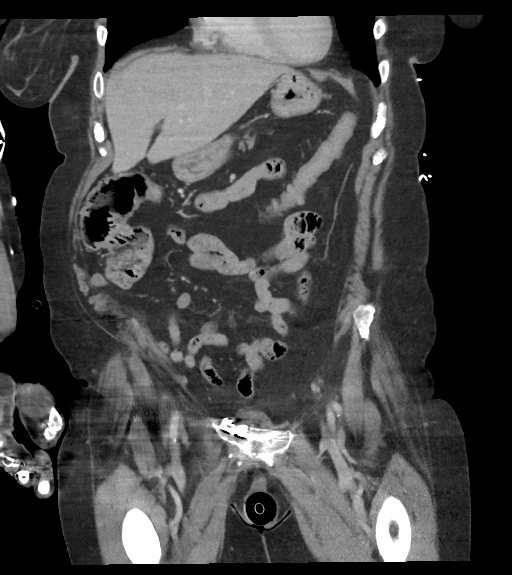
[im 44/98  soft-tissue]
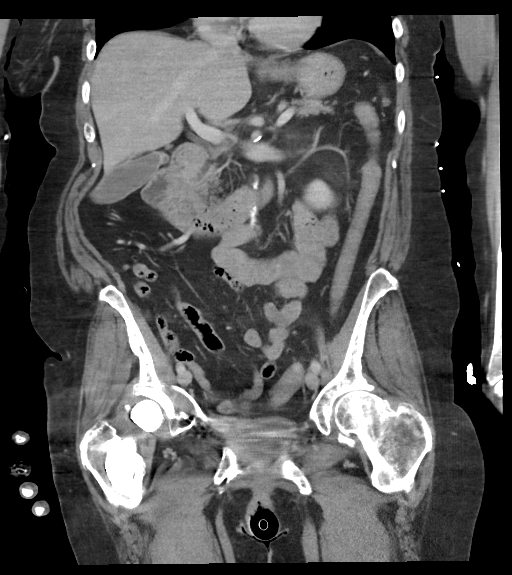
[im 54/98  soft-tissue]
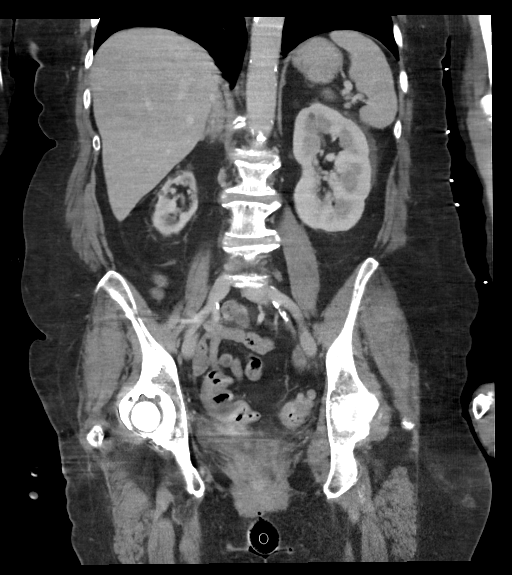

[16 of 46 positions shown; findings below may reference images not displayed]

RADIATION DOSE REDUCTION: This exam was performed according to the
departmental dose-optimization program which includes automated
exposure control, adjustment of the mA and/or kV according to
patient size and/or use of iterative reconstruction technique.

CONTRAST:  100mL OMNIPAQUE IOHEXOL 300 MG/ML SOLN, 100mL OMNIPAQUE
IOHEXOL 300 MG/ML SOLN
FINDINGS: Lower chest: No acute abnormality.

Hepatobiliary: 1.2 cm simple cyst within the inferior right hepatic
lobe. There are a few additional scattered subcentimeter low-density
lesions within the liver, too small to characterize. Unremarkable
gallbladder. No hyperdense gallstone. No biliary dilatation.

Pancreas: Unremarkable. No pancreatic ductal dilatation or
surrounding inflammatory changes.

Spleen: Normal in size without focal abnormality.

Adrenals/Urinary Tract: Unremarkable adrenal glands. Atrophic right
kidney containing multiple small cysts, no follow-up required.
Compensatory hypertrophy of the left kidney. No renal stone or
hydronephrosis. Urinary bladder is largely obscured by metallic
hardware in the pelvis.

Stomach/Bowel: Stomach is within normal limits. Appendix appears
normal. Extensive sigmoid diverticulosis. Long segment mild wall
thickening of the sigmoid colon. No pericolonic fat stranding or
fluid. No abnormally dilated loops of bowel.

Vascular/Lymphatic: Aortic atherosclerosis. No enlarged abdominal or
pelvic lymph nodes.

Reproductive: Status post hysterectomy. No adnexal masses.

Other: No free fluid. No abdominopelvic fluid collection. No
pneumoperitoneum. Tiny fat containing umbilical hernia.

Musculoskeletal: Prior ORIF of the pubic symphysis, prior right SI
joint fusion, and right total hip arthroplasty. Severe
osteoarthritis of the left hip. No acute osseous findings. Facet
predominant lumbar spondylosis.
IMPRESSION: 1. Extensive sigmoid diverticulosis with a long segment mild wall
thickening of the sigmoid colon without associated pericolonic fat
stranding or fluid. Findings may represent sequela of chronic
diverticulitis. A superimposed mild infectious or inflammatory
colitis is also a consideration
2. Atrophic right kidney with compensatory hypertrophy of the left
kidney. No evidence of obstructive uropathy.
3. Aortic atherosclerosis ([JN]-[JN]).

## 2021-10-13 IMAGING — DX DG CHEST 1V PORT
1 series · 1 of 1 positions shown · non-contrast
Comparison: AP chest [DATE]; CT chest [DATE]

CLINICAL DATA: Altered mental status.

EXAM:
PORTABLE CHEST 1 VIEW

[chest ap]
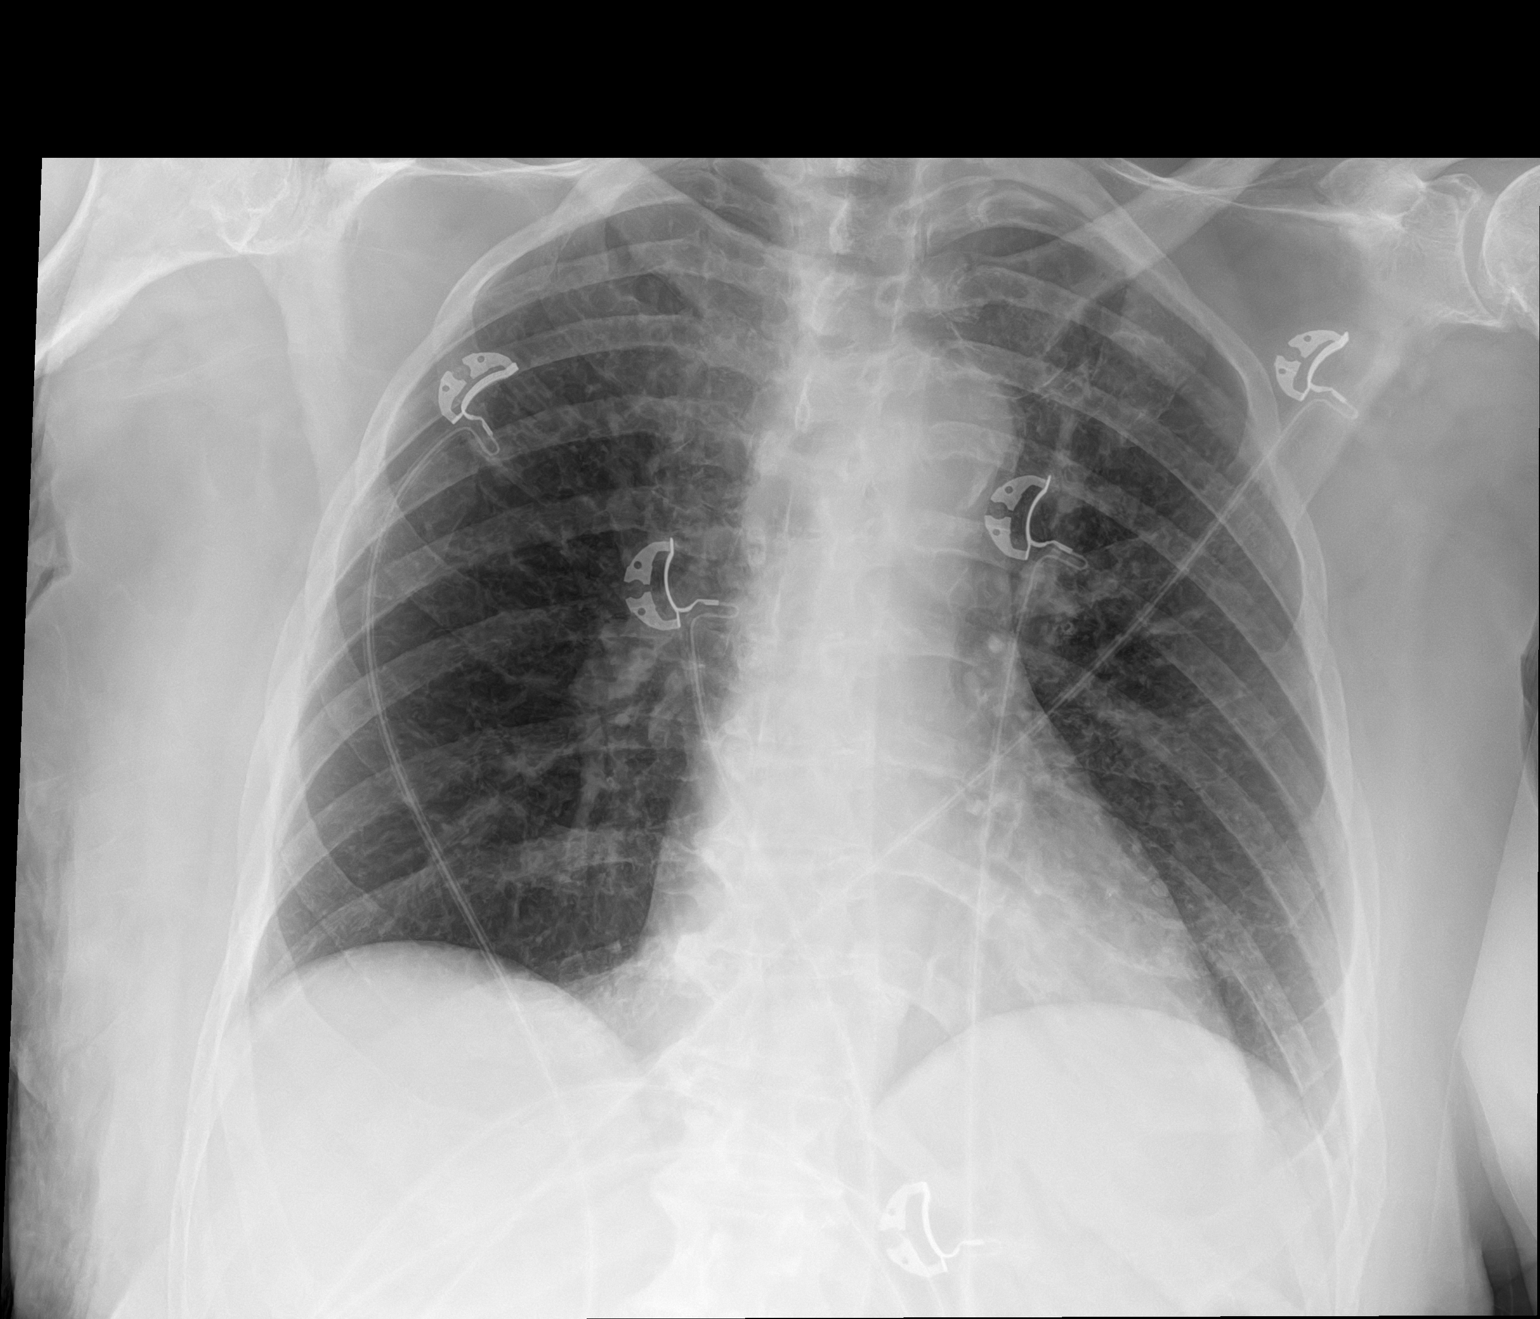

[1 of 1 positions shown; findings below may reference images not displayed]

FINDINGS: Cardiac silhouette and mediastinal contours are within normal
limits. Mild calcification within the aortic arch. Mild cystic
emphysematous changes greatest within the upper lungs. No focal
airspace opacity to indicate pneumonia. No pleural effusion
pneumothorax. Moderate multilevel degenerative disc changes of the
thoracic spine.
IMPRESSION: Chronic cystic emphysematous changes.  No acute lung process.

## 2021-10-13 IMAGING — CT CT HEAD W/O CM
4 series · 16 of 47 positions shown, 18 images · non-contrast
Comparison: MRI [DATE] and CT [DATE]

CLINICAL DATA: Altered mental status.



[Series 3: head w o · axial · 0.46mm/px · z∈[+57,+177]mm · 7 of 33 slices shown, 9 images]
[im 5/33  brain]
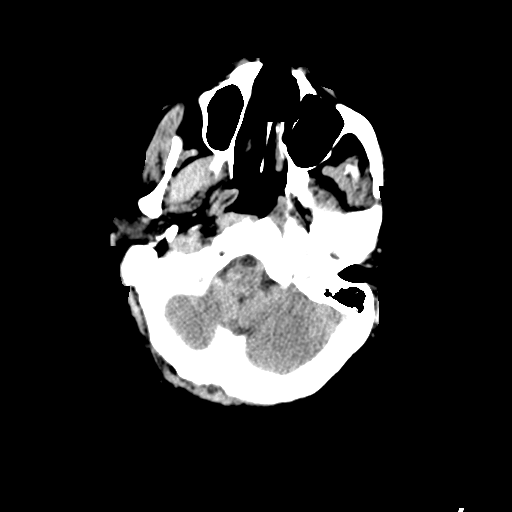
[im 5/33  bone]
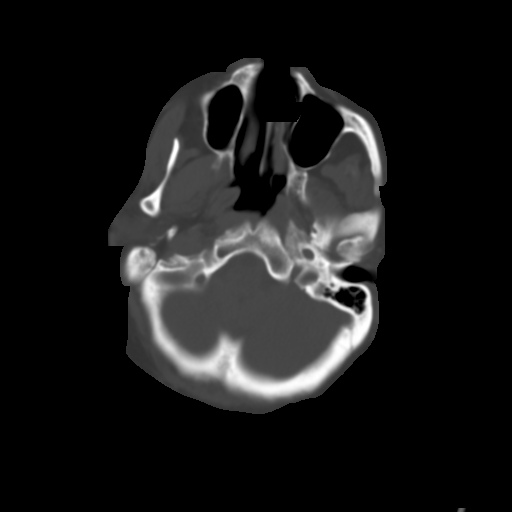
[im 9/33  brain]
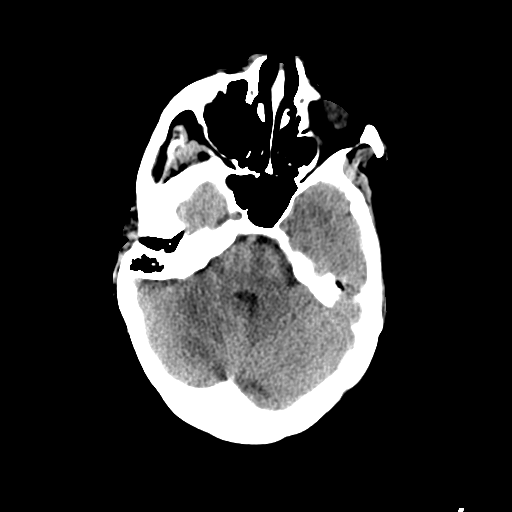
[im 13/33  brain]
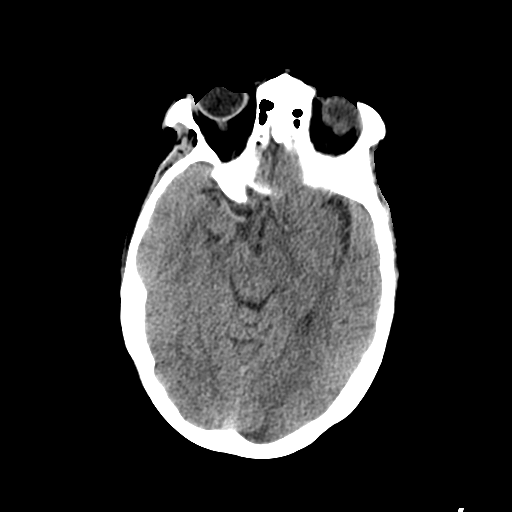
[im 17/33  brain]
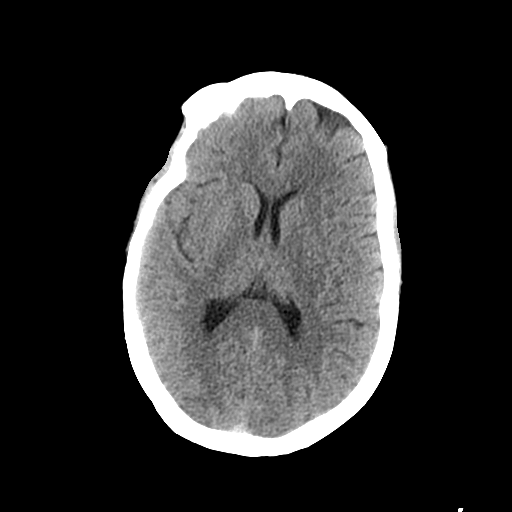
[im 21/33  brain]
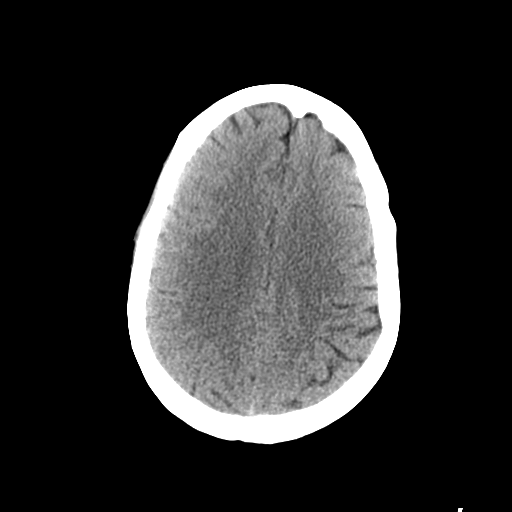
[im 21/33  bone]
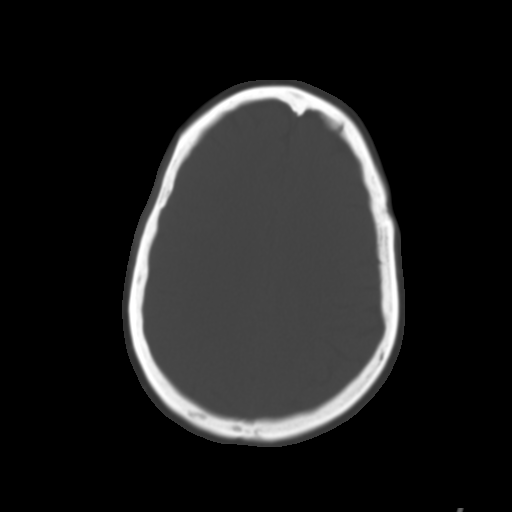
[im 25/33  brain]
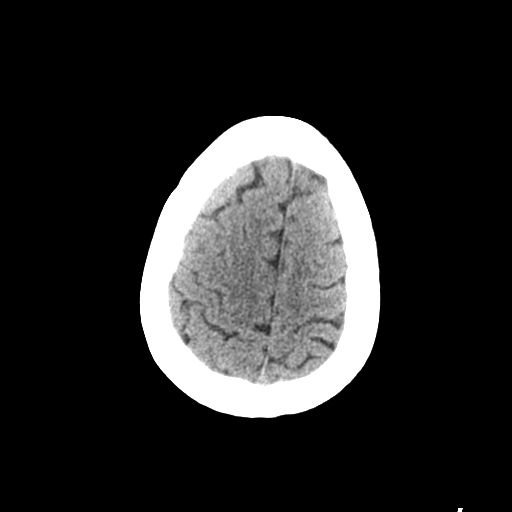
[im 29/33  brain]
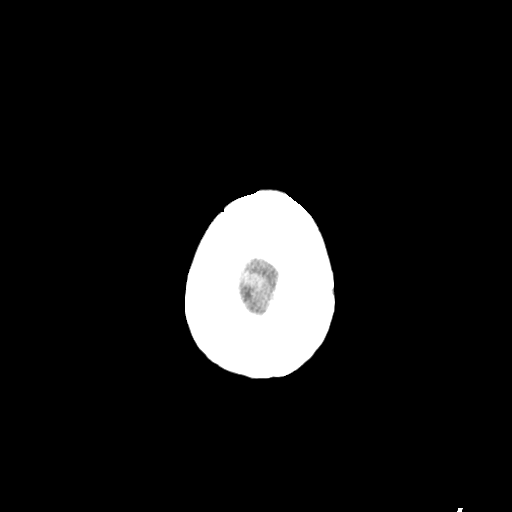

[Series 4: head bone · axial · 0.46mm/px · z∈[+53,+85]mm · 3 of 82 slices shown]
[im 9/82  bone]
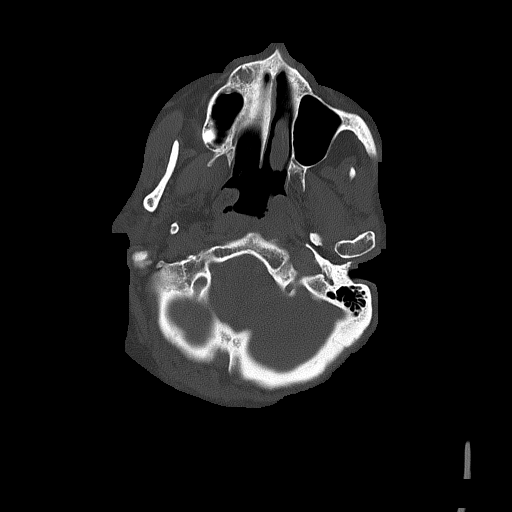
[im 17/82  bone]
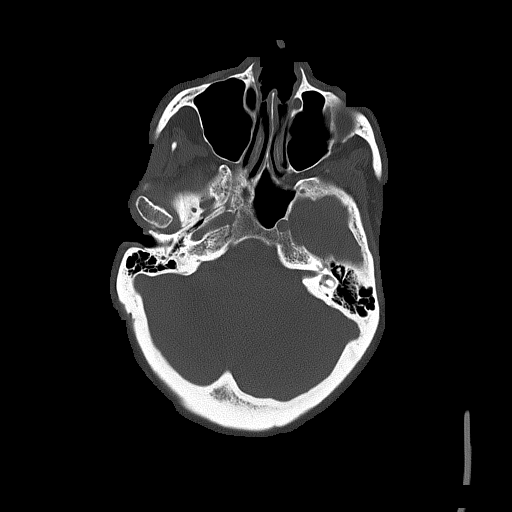
[im 25/82  bone]
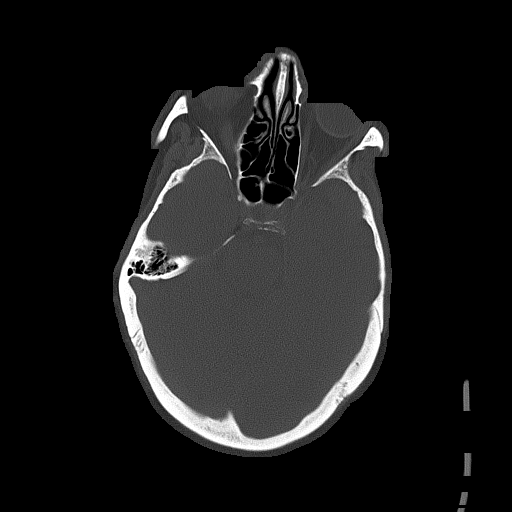

[Series 5: coronal soft · coronal · 0.33mm/px · 3 of 68 slices shown]
[im 23/68  brain]
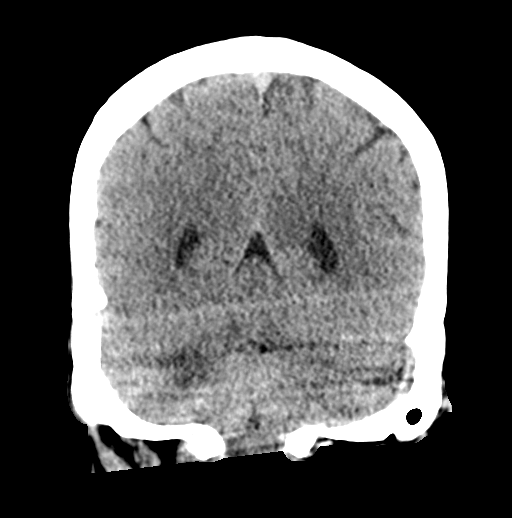
[im 30/68  brain]
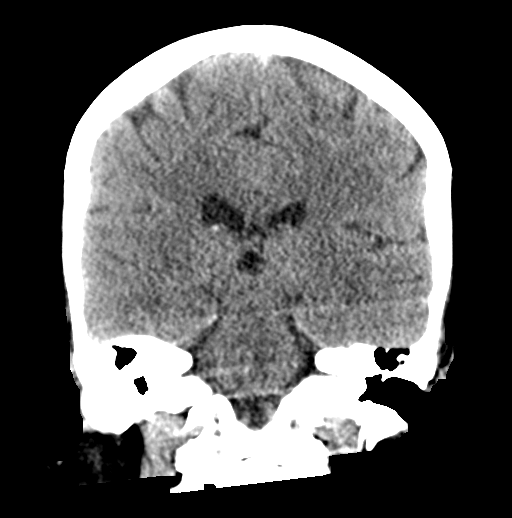
[im 38/68  brain]
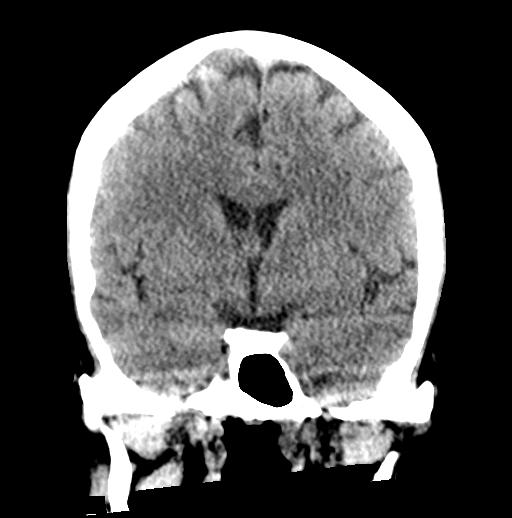

[Series 6: sagittal soft · sagittal · 0.33mm/px · 3 of 56 slices shown]
[im 20/56  brain]
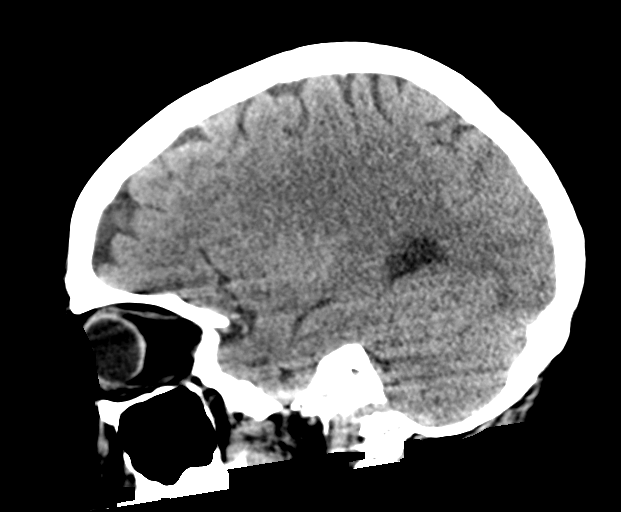
[im 28/56  brain]
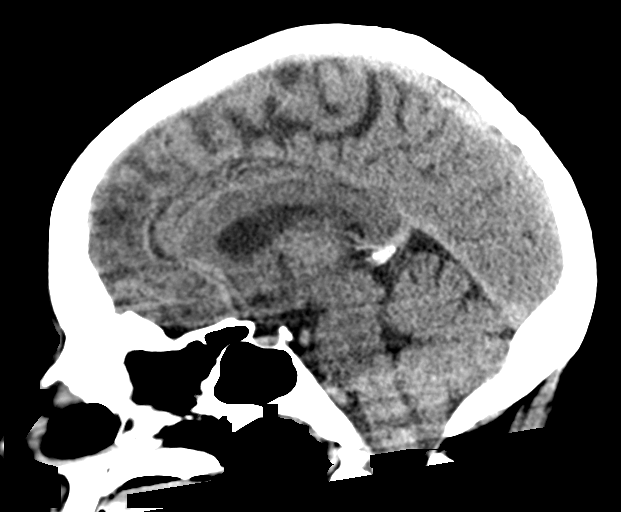
[im 37/56  brain]
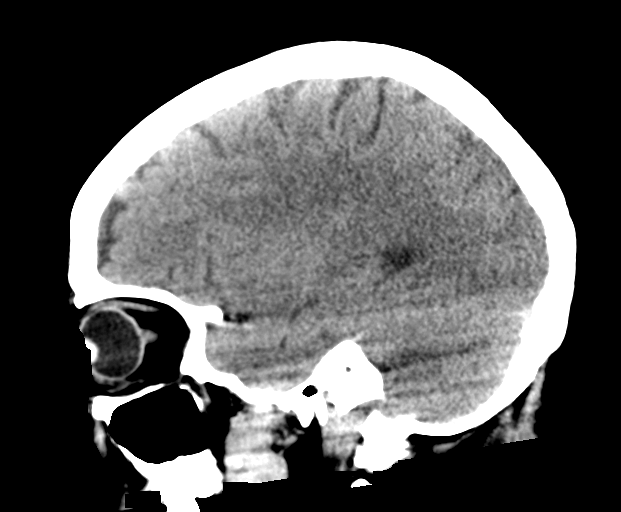

[16 of 47 positions shown; findings below may reference images not displayed]

FINDINGS: Brain: Similar appearance of the hypodensity in the bilateral
parieto-occipital regions and cerebellar hemisphere, possibly
reflecting vasogenic edema as seen in the setting of PRES. No
evidence of intracranial hemorrhage. No hydrocephalus.

Vascular: No hyperdense vessel or unexpected calcification.

Skull: Normal. Negative for fracture or focal lesion.

Sinuses/Orbits: Visualized portions of the paranasal sinuses are
predominantly clear. Orbits are unremarkable.

Other: Mastoid air cells are predominantly clear.
IMPRESSION: Similar appearance of the hypodensity in the bilateral
parieto-occipital regions and cerebellar hemispheres, possibly
reflecting vasogenic edema as seen in the setting of PRES.

## 2021-10-13 MED ORDER — HALOPERIDOL LACTATE 5 MG/ML IJ SOLN
5.0000 mg | Freq: Four times a day (QID) | INTRAMUSCULAR | Status: DC | PRN
Start: 2021-10-13 — End: 2021-10-16
  Administered 2021-10-13 – 2021-10-14 (×3): 5 mg via INTRAVENOUS
  Filled 2021-10-13 (×4): qty 1

## 2021-10-13 MED ORDER — OXYCODONE HCL ER 15 MG PO T12A: 15.0000 mg | EXTENDED_RELEASE_TABLET | Freq: Two times a day (BID) | ORAL | Status: DC

## 2021-10-13 MED ORDER — HALOPERIDOL LACTATE 5 MG/ML IJ SOLN
5.0000 mg | Freq: Once | INTRAMUSCULAR | Status: AC
  Administered 2021-10-13: 5 mg via INTRAMUSCULAR
  Filled 2021-10-13: qty 1

## 2021-10-13 MED ORDER — MORPHINE SULFATE (PF) 2 MG/ML IV SOLN
2.0000 mg | INTRAVENOUS | Status: DC | PRN
  Administered 2021-10-14 – 2021-10-16 (×6): 2 mg via INTRAVENOUS
  Filled 2021-10-13 (×7): qty 1

## 2021-10-13 MED ORDER — LABETALOL HCL 5 MG/ML IV SOLN: 10.0000 mg | Freq: Once | INTRAVENOUS | Status: DC

## 2021-10-13 MED ORDER — IOHEXOL 300 MG/ML  SOLN
100.0000 mL | Freq: Once | INTRAMUSCULAR | Status: AC | PRN
  Administered 2021-10-13: 100 mL via INTRAVENOUS

## 2021-10-13 MED ORDER — PANTOPRAZOLE SODIUM 40 MG PO TBEC: 40.0000 mg | DELAYED_RELEASE_TABLET | Freq: Every day | ORAL | Status: DC

## 2021-10-13 MED ORDER — FOLIC ACID 5 MG/ML IJ SOLN
1.0000 mg | Freq: Every day | INTRAMUSCULAR | Status: DC
  Administered 2021-10-13 – 2021-10-15 (×3): 1 mg via INTRAVENOUS
  Filled 2021-10-13 (×4): qty 0.2

## 2021-10-13 MED ORDER — ATORVASTATIN CALCIUM 40 MG PO TABS: 80.0000 mg | ORAL_TABLET | Freq: Every day | ORAL | Status: DC

## 2021-10-13 MED ORDER — CLOPIDOGREL BISULFATE 75 MG PO TABS: 75.0000 mg | ORAL_TABLET | Freq: Every day | ORAL | Status: DC

## 2021-10-13 MED ORDER — CLONAZEPAM 0.25 MG PO TBDP: 0.2500 mg | ORAL_TABLET | Freq: Two times a day (BID) | ORAL | Status: DC | PRN

## 2021-10-13 MED ORDER — ONDANSETRON HCL 4 MG/2ML IJ SOLN: 4.0000 mg | Freq: Four times a day (QID) | INTRAMUSCULAR | Status: DC | PRN

## 2021-10-13 MED ORDER — ONDANSETRON HCL 4 MG PO TABS: 4.0000 mg | ORAL_TABLET | Freq: Four times a day (QID) | ORAL | Status: DC | PRN

## 2021-10-13 MED ORDER — FLUTICASONE FUROATE-VILANTEROL 200-25 MCG/ACT IN AEPB
1.0000 | INHALATION_SPRAY | Freq: Every day | RESPIRATORY_TRACT | Status: DC
  Administered 2021-10-15 – 2021-10-16 (×2): 1 via RESPIRATORY_TRACT
  Filled 2021-10-13: qty 28

## 2021-10-13 MED ORDER — GABAPENTIN 300 MG PO CAPS: 600.0000 mg | ORAL_CAPSULE | Freq: Two times a day (BID) | ORAL | Status: DC

## 2021-10-13 MED ORDER — LORAZEPAM 2 MG/ML IJ SOLN
1.0000 mg | Freq: Once | INTRAMUSCULAR | Status: AC
  Administered 2021-10-13: 1 mg via INTRAVENOUS
  Filled 2021-10-13: qty 1

## 2021-10-13 MED ORDER — LACTATED RINGERS IV BOLUS
1000.0000 mL | Freq: Once | INTRAVENOUS | Status: AC
  Administered 2021-10-13: 1000 mL via INTRAVENOUS

## 2021-10-13 MED ORDER — HALOPERIDOL LACTATE 5 MG/ML IJ SOLN
5.0000 mg | Freq: Once | INTRAMUSCULAR | Status: AC
  Administered 2021-10-13: 5 mg via INTRAVENOUS
  Filled 2021-10-13: qty 1

## 2021-10-13 MED ORDER — HYDRALAZINE HCL 20 MG/ML IJ SOLN
10.0000 mg | Freq: Three times a day (TID) | INTRAMUSCULAR | Status: DC | PRN
Start: 2021-10-13 — End: 2021-10-13

## 2021-10-13 MED ORDER — HYDROXYZINE HCL 25 MG PO TABS: 50.0000 mg | ORAL_TABLET | Freq: Three times a day (TID) | ORAL | Status: DC | PRN

## 2021-10-13 MED ORDER — CARVEDILOL 3.125 MG PO TABS: 6.2500 mg | ORAL_TABLET | Freq: Two times a day (BID) | ORAL | Status: DC

## 2021-10-13 MED ORDER — DICYCLOMINE HCL 10 MG PO CAPS
10.0000 mg | ORAL_CAPSULE | Freq: Three times a day (TID) | ORAL | Status: DC | PRN
Start: 2021-10-13 — End: 2021-10-13

## 2021-10-13 MED ORDER — LABETALOL HCL 5 MG/ML IV SOLN: 5.0000 mg | INTRAVENOUS | Status: DC | PRN

## 2021-10-13 MED ORDER — NICARDIPINE HCL IN NACL 20-0.86 MG/200ML-% IV SOLN: 3.0000 mg/h | INTRAVENOUS | Status: DC

## 2021-10-13 MED ORDER — ALBUTEROL SULFATE (2.5 MG/3ML) 0.083% IN NEBU
2.5000 mg | INHALATION_SOLUTION | RESPIRATORY_TRACT | Status: DC | PRN
Start: 2021-10-13 — End: 2021-10-16

## 2021-10-13 MED ORDER — LOSARTAN POTASSIUM 50 MG PO TABS
100.0000 mg | ORAL_TABLET | Freq: Every day | ORAL | Status: DC
Start: 2021-10-13 — End: 2021-10-13

## 2021-10-13 MED ORDER — THIAMINE HCL 100 MG/ML IJ SOLN
100.0000 mg | Freq: Every day | INTRAMUSCULAR | Status: DC
  Administered 2021-10-13 – 2021-10-15 (×3): 100 mg via INTRAVENOUS
  Filled 2021-10-13 (×4): qty 2

## 2021-10-13 MED ORDER — CHLORHEXIDINE GLUCONATE CLOTH 2 % EX PADS
6.0000 | MEDICATED_PAD | Freq: Every day | CUTANEOUS | Status: DC
Start: 2021-10-14 — End: 2021-10-16
  Administered 2021-10-14 – 2021-10-15 (×3): 6 via TOPICAL

## 2021-10-13 MED ORDER — FLUTICASONE PROPIONATE 50 MCG/ACT NA SUSP
1.0000 | Freq: Every day | NASAL | Status: DC
  Administered 2021-10-15: 1 via NASAL
  Filled 2021-10-13: qty 16

## 2021-10-13 MED ORDER — ENOXAPARIN SODIUM 40 MG/0.4ML IJ SOSY
40.0000 mg | PREFILLED_SYRINGE | INTRAMUSCULAR | Status: DC
  Administered 2021-10-13 – 2021-10-15 (×3): 40 mg via SUBCUTANEOUS
  Filled 2021-10-13 (×3): qty 0.4

## 2021-10-13 MED ORDER — LEVOTHYROXINE SODIUM 100 MCG PO TABS: 100.0000 ug | ORAL_TABLET | Freq: Every day | ORAL | Status: DC

## 2021-10-13 MED ORDER — HYDRALAZINE HCL 20 MG/ML IJ SOLN
10.0000 mg | INTRAMUSCULAR | Status: DC | PRN
  Administered 2021-10-13 – 2021-10-15 (×5): 10 mg via INTRAVENOUS
  Filled 2021-10-13 (×5): qty 1

## 2021-10-13 NOTE — Plan of Care (Signed)
Called by Dr. Adrian Blackwater.  Patient was evaluated examined and seen by Dr. Melynda Ripple via telemedicine.  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  EEG was recommended.  Technologist attempted but patient extremely combative and EEG incomplete.  Dr. Adrian Blackwater wanted to know how important an urgent the EEG is.  I recommended blood pressure management and if she continues to be encephalopathic tomorrow, transfer over to [facility] and please let neurology know to see her.  She might need EEG-both spot and LTM EEG at that time depending on what her exam looks like. Dr Adrian Blackwater agreed. Please feel free to reach out to the on-call neurologist overnight-listed in AMION  -- Milon Dikes, MD Neurologist [facility] Pager: [phone]

## 2021-10-13 NOTE — ED Triage Notes (Signed)
Pt in from home via [facility], per report the pts boyfriend said he woke up and she was unresponsive, upon arrival of EMS the pt was responsive to voice, pt stood to get on the stretcher, pt alert to self and situation, pt has strong urine smell upon arrival, pt hypotensive, A&O x4
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
## 2021-10-13 NOTE — Progress Notes (Signed)
EEG not Complete. Attempted the EEG with help with 3 nursing staff and family member. Pt was  way too combative making difficult for hookup.
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
## 2021-10-13 NOTE — ED Provider Notes (Signed)
  Physical Exam  BP (!) 187/58   Pulse 60   Temp 98.2 F (36.8 C) (Rectal)   Resp 16   Ht 5\' 3"  (1.6 m)   Wt 75 kg   SpO2 96%   BMI 29.29 kg/m   Physical Exam  Procedures  Procedures  ED Course / MDM    Medical Decision Making Amount and/or Complexity of Data Reviewed Labs: ordered. Radiology: ordered.  Risk Prescription drug management.   Received in signout.  Mental status change.  Agitation.  Initially hypertensive.  Recent admission for same but had UTI.  Also had diagnosis of pres at that time.  Lab work reassuring without UTI, however CT scan does show likely recurrent or continuous press.  Blood pressures improved now to 160/80.  Has required sedatives however.  Now sleeping comfortably.  Will require admission to the hospital for further treatment.  Was agitated unable to get abdomen pelvis CT scan.  Blood pressure is improving.  Most recently was 165/95.  Will give a dose of labetalol now.  Goal blood pressure more 130-150/80-100.        , MD 10/13/21 [phone]

## 2021-10-14 ENCOUNTER — Inpatient Hospital Stay (HOSPITAL_COMMUNITY): Payer: Medicare Other

## 2021-10-14 DIAGNOSIS — G9340 Encephalopathy, unspecified: Secondary | ICD-10-CM

## 2021-10-14 LAB — BASIC METABOLIC PANEL
Anion gap: 11 (ref 5–15)
BUN: 13 mg/dL (ref 8–23)
CO2: 21 mmol/L — ABNORMAL LOW (ref 22–32)
Calcium: 9.2 mg/dL (ref 8.9–10.3)
Chloride: 107 mmol/L (ref 98–111)
Creatinine, Ser: 0.81 mg/dL (ref 0.44–1.00)
GFR, Estimated: >60 mL/min (ref >60)
Glucose, Bld: 102 mg/dL — ABNORMAL HIGH (ref 70–99)
Potassium: 3.3 mmol/L — ABNORMAL LOW (ref 3.5–5.1)
Sodium: 139 mmol/L (ref 135–145)

## 2021-10-14 LAB — HIV ANTIBODY (ROUTINE TESTING W REFLEX): HIV Screen 4th Generation wRfx: NONREACTIVE

## 2021-10-14 LAB — MRSA NEXT GEN BY PCR, NASAL: MRSA by PCR Next Gen: NOT DETECTED

## 2021-10-14 LAB — URINE CULTURE: Culture: NO GROWTH

## 2021-10-14 LAB — CBC
HCT: 41.5 % (ref 36.0–46.0)
Hemoglobin: 14.5 g/dL (ref 12.0–15.0)
MCH: 29.9 pg (ref 26.0–34.0)
MCHC: 34.9 g/dL (ref 30.0–36.0)
MCV: 85.6 fL (ref 80.0–100.0)
Platelets: 330 10*3/uL (ref 150–400)
RBC: 4.85 MIL/uL (ref 3.87–5.11)
RDW: 14 % (ref 11.5–15.5)
WBC: 16.1 10*3/uL — ABNORMAL HIGH (ref 4.0–10.5)
nRBC: 0 % (ref 0.0–0.2)

## 2021-10-14 MED ORDER — CLONIDINE HCL 0.2 MG/24HR TD PTWK
0.2000 mg | MEDICATED_PATCH | TRANSDERMAL | Status: DC
Start: 2021-10-14 — End: 2021-10-16
  Administered 2021-10-14: 0.2 mg via TRANSDERMAL
  Filled 2021-10-14: qty 1

## 2021-10-14 MED ORDER — DEXMEDETOMIDINE HCL IN NACL 400 MCG/100ML IV SOLN
0.4000 ug/kg/h | INTRAVENOUS | Status: DC
  Administered 2021-10-14: 0.4 ug/kg/h via INTRAVENOUS
  Administered 2021-10-14: 0.6 ug/kg/h via INTRAVENOUS
  Administered 2021-10-15: 0.7 ug/kg/h via INTRAVENOUS
  Administered 2021-10-15 – 2021-10-16 (×2): 0.4 ug/kg/h via INTRAVENOUS
  Filled 2021-10-14 (×6): qty 100

## 2021-10-14 MED ORDER — CARVEDILOL 12.5 MG PO TABS
12.5000 mg | ORAL_TABLET | Freq: Two times a day (BID) | ORAL | Status: DC
  Administered 2021-10-15 – 2021-10-16 (×3): 12.5 mg via ORAL
  Filled 2021-10-14 (×3): qty 1

## 2021-10-14 MED ORDER — NICOTINE 21 MG/24HR TD PT24
21.0000 mg | MEDICATED_PATCH | Freq: Every day | TRANSDERMAL | Status: DC
  Administered 2021-10-14 – 2021-10-16 (×3): 21 mg via TRANSDERMAL
  Filled 2021-10-14 (×3): qty 1

## 2021-10-14 MED ORDER — LORAZEPAM 2 MG/ML IJ SOLN
0.5000 mg | Freq: Once | INTRAMUSCULAR | Status: AC
  Administered 2021-10-14: 0.5 mg via INTRAVENOUS
  Filled 2021-10-14: qty 1

## 2021-10-14 MED ORDER — LEVOTHYROXINE SODIUM 100 MCG PO TABS
100.0000 ug | ORAL_TABLET | Freq: Every day | ORAL | Status: DC
  Administered 2021-10-15 – 2021-10-16 (×2): 100 ug via ORAL
  Filled 2021-10-14 (×3): qty 1

## 2021-10-14 MED ORDER — CLOPIDOGREL BISULFATE 75 MG PO TABS
75.0000 mg | ORAL_TABLET | Freq: Every day | ORAL | Status: DC
  Administered 2021-10-15 – 2021-10-16 (×2): 75 mg via ORAL
  Filled 2021-10-14 (×2): qty 1

## 2021-10-14 MED ORDER — POTASSIUM CHLORIDE 10 MEQ/100ML IV SOLN
10.0000 meq | INTRAVENOUS | Status: AC
  Administered 2021-10-14 (×4): 10 meq via INTRAVENOUS
  Filled 2021-10-14 (×4): qty 100

## 2021-10-14 MED ORDER — IOHEXOL 300 MG/ML  SOLN
100.0000 mL | Freq: Once | INTRAMUSCULAR | Status: AC | PRN
  Administered 2021-10-14: 100 mL via INTRAVENOUS

## 2021-10-14 MED ORDER — ATORVASTATIN CALCIUM 40 MG PO TABS
80.0000 mg | ORAL_TABLET | Freq: Every evening | ORAL | Status: DC
  Administered 2021-10-15: 80 mg via ORAL
  Filled 2021-10-14: qty 2

## 2021-10-14 NOTE — Progress Notes (Signed)
RN called due to patient being agitated and consistently Moving hands and legs with difficulty in being able to monitor patient's BP as recommended by neurologist.  Patient was already on two-point wrist.  She was unresponsive to scheduled other, Ativan 0.5 mg was also given without any improvement in patient's agitated movements.  Precedex was ordered.  Continue to monitor patient for improvement.

## 2021-10-14 NOTE — Progress Notes (Signed)
06/02 2000-Unable to get a BP reading due to pt constantly moving arms and legs. MD Nehemiah Settle made aware. Haldol ordered.  06/03 0030- Again unable to consistently get pt's BP due to pt constantly moving arms and legs. MD Josephine Cables made aware.  0230- Unable to get a reading on pt's BP. Haldol given again.  [facility] came to assess pt. Ativan given.  0430- Pt still constantly moving arms and legs. Unable to get BP reading. [place] made aware. Precedex ordered.

## 2021-10-14 NOTE — Progress Notes (Signed)
Consulted MD recommended to continue restraints but OK to discontinue precedex.

## 2021-10-14 NOTE — Progress Notes (Signed)
Restraints removed for CT. And precedex stopped. Patient remained calm during and after I decided to stop both and see how she will do. As of this note patient is asleep and lying on her side without incident or restraints and sedation.

## 2021-10-14 NOTE — Progress Notes (Signed)
PROGRESS NOTE     Jessica Bell, is a 61 y.o. female, DOB - 02-24-1961, ZOX:[id number]  Admit date - 10/13/2021   Admitting Physician Jessica HeritageJacob J Stinson, Bell  Outpatient Primary MD for the patient is Jessica Bell, Jessica Bell  LOS - 1  Chief Complaint  Patient presents with   Altered Mental Status        Brief Narrative:  61 y.o. female with medical history significant of hypertension, heart failure with preserved EF, hyperlipidemia, COPD, coronary artery disease with history of MI, hypothyroidism, chronic pain on chronic opiate therapy admitted on 10/13/21 with acute metabolic encephalopathy and PRES with severely elevated blood pressure, similar to patient presentation on 09/19/2021    -Assessment and Plan: 1)PRES---(posterior reversible encephalopathy syndrome)-neurology input appreciated -similar to patient presentation on 09/19/2021 -Recent EEG from 09/20/2021 was without seizures or epileptiform findings  -Okay to wean off Cardene drip, -Goal is to keep BP 8 less than 140/90 -When able to take oral medications may Restart Coreg, -Give clonidine patch   2)COPD/Tobacco Abuse-- -give nicotine patch -Bronchodilators as ordered  3)HFpEF--history of chronic diastolic dysfunction CHF -05/11/2021 echo EF 50-55%, G1DD Distal septal HK -Clinically appears euvolemic/compensated -Restart Coreg when taking oral intake  4)CAD S/P percutaneous coronary angioplasty -Chest pain-free, no ACS type findings  continue Plavix and Lipitor  5) chronic pain syndrome/chronic opiate dependence--continue opiates  6)HTN--doubt compliance with BP meds, consider clonidine patch which may be easier to comply with given that this is only once a week  7) anxiety disorder--benzos as needed  8) acute metabolic encephalopathy--due to #1 above, failed as needed Haldol and lorazepam, failed wrist restraints -Okay to continue Precedex for agitation and restlessness  9)Social/ethics--- patient with history of  noncompliance, patient with history of signing out AMA, patient with history of chronic opiate and benzo use, -Attempted to reach patient's significant other Mr. Jessica Bell--- left voicemail at [phone]  CRITICAL CARE Performed by: Jessica Bell   Total critical care time: 46 minutes  Critical care time was exclusive of separately billable procedures and treating other patients.  Critical care was necessary to treat or prevent imminent or life-threatening deterioration. -Severely elevated blood pressure with PRES and acute metabolic encephalopathy requiring IV Cardene drip and IV Precedex drip  Critical care was time spent personally by me on the following activities: development of treatment plan with patient and/or surrogate as well as nursing, discussions with consultants, evaluation of patient's response to treatment, examination of patient, obtaining history from patient or surrogate, ordering and performing treatments and interventions, ordering and review of laboratory studies, ordering and review of radiographic studies, pulse oximetry and re-evaluation of patient's condition.  Disposition/Need for in-Hospital Stay- patient unable to be discharged at this time due to PRES--requiring IV Cardene drip and IV  Status is: Inpatient   Disposition: The patient is from: Home              Anticipated d/c is to: Home              Anticipated d/c date is: 2 days              Patient currently is not medically stable to d/c. Barriers: Not Clinically Stable-   Code Status :  -  Code Status: Full Code   Family Communication:  -Attempted to reach patient's significant other Mr. Jessica Bell--- left voicemail at [phone]  DVT Prophylaxis  :   - SCDs   enoxaparin (LOVENOX) injection 40 mg Start: 10/13/21 2000   Lab  Results  Component Value Date   PLT 330 10/14/2021    Inpatient Medications  Scheduled Meds:  Chlorhexidine Gluconate Cloth  6 each Topical Q0600   enoxaparin  (LOVENOX) injection  40 mg Subcutaneous Q24H   fluticasone  1 spray Each Nare Daily   fluticasone furoate-vilanterol  1 puff Inhalation Daily   folic acid  1 mg Intravenous Daily   levothyroxine  100 mcg Oral Q0600   thiamine injection  100 mg Intravenous Daily   Continuous Infusions:  dexmedetomidine (PRECEDEX) IV infusion Stopped (10/14/21 0945)   niCARDipine     PRN Meds:.albuterol, haloperidol lactate, hydrALAZINE, morphine injection, [DISCONTINUED] ondansetron **OR** ondansetron (ZOFRAN) IV   Anti-infectives (From admission, onward)    None         Subjective: Jessica Bell today has no fevers, no emesis,  No chest pain,   - Remains confused, initially came off Precedex drip became more agitated and restless and uncooperative -Attempted to reach patient's significant other Mr. Jessica Amel--- left voicemail at [phone] -  Objective: Vitals:   10/14/21 1100 10/14/21 1126 10/14/21 1200 10/14/21 1300  BP: (!) 146/65  (!) 140/125 (!) 129/103  Pulse: 62     Resp: 16 (!) 26 18 (!) 27  Temp:  99.3 F (37.4 C)    TempSrc:  Axillary    SpO2: 96%     Weight:      Height:        Intake/Output Summary (Last 24 hours) at 10/14/2021 1420 Last data filed at 10/14/2021 1300 Gross per 24 hour  Intake 60.9 ml  Output 25 ml  Net 35.9 ml   Filed Weights   10/13/21 1103 10/13/21 1937 10/14/21 0600  Weight: 75 kg 73.2 kg 74.1 kg    Physical Exam  Gen:- Awake , confused and disoriented HEENT:- [place].AT, No sclera icterus Neck-Supple Neck,No JVD,.  Lungs-  CTAB , fair symmetrical air movement CV- S1, S2 normal, regular  Abd-  +ve B.Sounds, Abd Soft, No tenderness,    Extremity/Skin:- No  edema, pedal pulses present  NeuroPsych-she was calm on Precedex drip, became more confused and agitated when she came off Precedex drip, moving all extremities spontaneously... No obvious focal deficits at this time  Data Reviewed: I have personally reviewed following labs and imaging  studies  CBC: Recent Labs  Lab 10/13/21 1024 10/13/21 1053 10/14/21 0428  WBC 11.1*  --  16.1*  NEUTROABS 9.1*  --   --   HGB 15.7* 16.0* 14.5  HCT 46.0 47.0* 41.5  MCV 86.6  --  85.6  PLT 356  --  330   Basic Metabolic Panel: Recent Labs  Lab 10/13/21 1024 10/13/21 1053 10/14/21 0428  NA 139 138 139  K 3.7 3.7 3.3*  CL 102 103 107  CO2 24  --  21*  GLUCOSE 108* 107* 102*  BUN CREATININE 0.70 0.70 0.81  CALCIUM 9.6  --  9.2   GFR: Estimated Creatinine Clearance: 72 mL/min (by C-G formula based on SCr of 0.81 mg/dL). Liver Function Tests: Recent Labs  Lab 10/13/21 1024  AST 21  ALT 18  ALKPHOS 101  BILITOT 0.9  PROT 7.7  ALBUMIN 4.3   Cardiac Enzymes: No results for input(s): CKTOTAL, CKMB, CKMBINDEX, TROPONINI in the last 168 hours. BNP (last 3 results) No results for input(s): PROBNP in the last 8760 hours. HbA1C: No results for input(s): HGBA1C in the last 72 hours. Sepsis Labs: (procalcitonin:4,lacticidven:4) ) Recent Results (from the past 240  hour(s))  Culture, blood (Routine x 2)     Status: None (Preliminary result)   Collection Time: 10/13/21 10:51 AM   Specimen: Left Antecubital; Blood  Result Value Ref Range Status   Specimen Description LEFT ANTECUBITAL  Final   Special Requests   Final    BOTTLES DRAWN AEROBIC AND ANAEROBIC Blood Culture results may not be optimal due to an inadequate volume of blood received in culture bottles   Culture   Final    NO GROWTH < 24 HOURS Performed at [facility], [address], [place], [place] [address]    Report Status PENDING  Incomplete  Culture, blood (Routine x 2)     Status: None (Preliminary result)   Collection Time: 10/13/21 10:51 AM   Specimen: Right Antecubital; Blood  Result Value Ref Range Status   Specimen Description RIGHT ANTECUBITAL  Final   Special Requests   Final    BOTTLES DRAWN AEROBIC AND ANAEROBIC Blood Culture results may not be optimal due to an inadequate  volume of blood received in culture bottles   Culture   Final    NO GROWTH < 24 HOURS Performed at [facility], [address], [place], [place] [address]    Report Status PENDING  Incomplete  Resp Panel by RT-PCR (Flu A&B, Covid) Anterior Nasal Swab     Status: None   Collection Time: 10/13/21 10:51 AM   Specimen: Anterior Nasal Swab  Result Value Ref Range Status   SARS Coronavirus 2 by RT PCR NEGATIVE NEGATIVE Final    Comment: (NOTE) SARS-CoV-2 target nucleic acids are NOT DETECTED.  The SARS-CoV-2 RNA is generally detectable in upper respiratory specimens during the acute phase of infection. The lowest concentration of SARS-CoV-2 viral copies this assay can detect is 138 copies/mL. A negative result does not preclude SARS-Cov-2 infection and should not be used as the sole basis for treatment or other patient management decisions. A negative result may occur with  improper specimen collection/handling, submission of specimen other than nasopharyngeal swab, presence of viral mutation(s) within the areas targeted by this assay, and inadequate number of viral copies(<138 copies/mL). A negative result must be combined with clinical observations, patient history, and epidemiological information. The expected result is Negative.  Fact Sheet for Patients:  BloggerCourse.com  Fact Sheet for Healthcare Providers:  SeriousBroker.it  This test is no t yet approved or cleared by the [place] FDA and  has been authorized for detection and/or diagnosis of SARS-CoV-2 by FDA under an Emergency Use Authorization (EUA). This EUA will remain  in effect (meaning this test can be used) for the duration of the COVID-19 declaration under Section 564(b)(1) of the Act, 21 U.S.C.section 360bbb-3(b)(1), unless the authorization is terminated  or revoked sooner.       Influenza A by PCR NEGATIVE NEGATIVE Final   Influenza B by PCR NEGATIVE  NEGATIVE Final    Comment: (NOTE) The Xpert Xpress SARS-CoV-2/FLU/RSV plus assay is intended as an aid in the diagnosis of influenza from Nasopharyngeal swab specimens and should not be used as a sole basis for treatment. Nasal washings and aspirates are unacceptable for Xpert Xpress SARS-CoV-2/FLU/RSV testing.  Fact Sheet for Patients: BloggerCourse.com  Fact Sheet for Healthcare Providers: SeriousBroker.it  This test is not yet approved or cleared by the [place] FDA and has been authorized for detection and/or diagnosis of SARS-CoV-2 by FDA under an Emergency Use Authorization (EUA). This EUA will remain in effect (meaning this test can be used) for the  duration of the COVID-19 declaration under Section 564(b)(1) of the Act, 21 U.S.C. section 360bbb-3(b)(1), unless the authorization is terminated or revoked.  Performed at [facility], [address] [place], [place] [address]   Urine Culture     Status: None   Collection Time: 10/13/21 10:51 AM   Specimen: Urine, Clean Catch  Result Value Ref Range Status   Specimen Description   Final    URINE, CLEAN CATCH Performed at [facility], [address], [place], [place] [address]    Special Requests   Final    NONE Performed at [facility], [address], [place], [place] [address]    Culture   Final    NO GROWTH Performed at [facility], [address], [place], [place] [address]    Report Status 10/14/2021 FINAL  Final  MRSA Next Gen by PCR, Nasal     Status: None   Collection Time: 10/13/21  7:01 PM   Specimen: Nasal Mucosa; Nasal Swab  Result Value Ref Range Status   MRSA by PCR Next Gen NOT DETECTED NOT DETECTED Final    Comment: (NOTE) The GeneXpert MRSA Assay (FDA approved for NASAL specimens only), is one component of a comprehensive MRSA colonization surveillance program. It is not intended to diagnose MRSA infection nor to guide or monitor  treatment for MRSA infections. Test performance is not FDA approved in patients less than 43 years old. Performed at [facility], [address], [place], [place] [address]       Radiology Studies: CT Head Wo Contrast  Result Date: 10/13/2021 CLINICAL DATA:  Altered mental status. EXAM: CT HEAD WITHOUT CONTRAST TECHNIQUE: Contiguous axial images were obtained from the base of the skull through the vertex without intravenous contrast. RADIATION DOSE REDUCTION: This exam was performed according to the departmental dose-optimization program which includes automated exposure control, adjustment of the mA and/or kV according to patient size and/or use of iterative reconstruction technique. COMPARISON:  MRI Sep 20, 2021 and CT Sep 19, 2021 FINDINGS: Brain: Similar appearance of the hypodensity in the bilateral parieto-occipital regions and cerebellar hemisphere, possibly reflecting vasogenic edema as seen in the setting of PRES. No evidence of intracranial hemorrhage. No hydrocephalus. Vascular: No hyperdense vessel or unexpected calcification. Skull: Normal. Negative for fracture or focal lesion. Sinuses/Orbits: Visualized portions of the paranasal sinuses are predominantly clear. Orbits are unremarkable. Other: Mastoid air cells are predominantly clear. IMPRESSION: Similar appearance of the hypodensity in the bilateral parieto-occipital regions and cerebellar hemispheres, possibly reflecting vasogenic edema as seen in the setting of PRES. Electronically Signed   By: Maudry Mayhew M.D.   On: 10/13/2021 15:10   CT ABDOMEN PELVIS W CONTRAST  Result Date: 10/14/2021 CLINICAL DATA:  Abdominal pain, acute, nonlocalized EXAM: CT ABDOMEN AND PELVIS WITH CONTRAST TECHNIQUE: Multidetector CT imaging of the abdomen and pelvis was performed using the standard protocol following bolus administration of intravenous contrast. RADIATION DOSE REDUCTION: This exam was performed according to the departmental dose-optimization  program which includes automated exposure control, adjustment of the mA and/or kV according to patient size and/or use of iterative reconstruction technique. CONTRAST:  OMNIPAQUE IOHEXOL 300 MG/ML SOLN, OMNIPAQUE IOHEXOL 300 MG/ML SOLN COMPARISON:  None Available. FINDINGS: Lower chest: No acute abnormality. Hepatobiliary: 1.2 cm simple cyst within the inferior right hepatic lobe. There are a few additional scattered subcentimeter low-density lesions within the liver, too small to characterize. Unremarkable gallbladder. No hyperdense gallstone. No biliary dilatation. Pancreas: Unremarkable. No pancreatic ductal dilatation  or surrounding inflammatory changes. Spleen: Normal in size without focal abnormality. Adrenals/Urinary Tract: Unremarkable adrenal glands. Atrophic right kidney containing multiple small cysts, no follow-up required. Compensatory hypertrophy of the left kidney. No renal stone or hydronephrosis. Urinary bladder is largely obscured by metallic hardware in the pelvis. Stomach/Bowel: Stomach is within normal limits. Appendix appears normal. Extensive sigmoid diverticulosis. Long segment mild wall thickening of the sigmoid colon. No pericolonic fat stranding or fluid. No abnormally dilated loops of bowel. Vascular/Lymphatic: Aortic atherosclerosis. No enlarged abdominal or pelvic lymph nodes. Reproductive: Status post hysterectomy. No adnexal masses. Other: No free fluid. No abdominopelvic fluid collection. No pneumoperitoneum. Tiny fat containing umbilical hernia. Musculoskeletal: Prior ORIF of the pubic symphysis, prior right SI joint fusion, and right total hip arthroplasty. Severe osteoarthritis of the left hip. No acute osseous findings. Facet predominant lumbar spondylosis. IMPRESSION: 1. Extensive sigmoid diverticulosis with a long segment mild wall thickening of the sigmoid colon without associated pericolonic fat stranding or fluid. Findings may represent sequela of chronic  diverticulitis. A superimposed mild infectious or inflammatory colitis is also a consideration 2. Atrophic right kidney with compensatory hypertrophy of the left kidney. No evidence of obstructive uropathy. 3. Aortic atherosclerosis (ICD10-I70.0). Electronically Signed   By: Duanne Guess D.O.   On: 10/14/2021 10:27   DG Chest Portable 1 View  Result Date: 10/13/2021 CLINICAL DATA:  Altered mental status. EXAM: PORTABLE CHEST 1 VIEW COMPARISON:  AP chest 09/19/2021; CT chest 05/23/2020 FINDINGS: Cardiac silhouette and mediastinal contours are within normal limits. Mild calcification within the aortic arch. Mild cystic emphysematous changes greatest within the upper lungs. No focal airspace opacity to indicate pneumonia. No pleural effusion pneumothorax. Moderate multilevel degenerative disc changes of the thoracic spine. IMPRESSION: Chronic cystic emphysematous changes.  No acute lung process. Electronically Signed   By: Neita Garnet M.D.   On: 10/13/2021 10:26     Scheduled Meds:  Chlorhexidine Gluconate Cloth  6 each Topical Q0600   enoxaparin (LOVENOX) injection  40 mg Subcutaneous Q24H   fluticasone  1 spray Each Nare Daily   fluticasone furoate-vilanterol  1 puff Inhalation Daily   folic acid  1 mg Intravenous Daily   levothyroxine  100 mcg Oral Q0600   thiamine injection  100 mg Intravenous Daily   Continuous Infusions:  dexmedetomidine (PRECEDEX) IV infusion Stopped (10/14/21 0945)   niCARDipine       LOS: 1 day    Jessica Hale M.D on 10/14/2021 at 2:20 PM  Go to [email or web address] - for contact info  [facility] - Office  [phone]  If 7PM-7AM, please contact night-coverage [email or web address] Password [email or web address] 10/14/2021, 2:20 PM

## 2021-10-15 ENCOUNTER — Encounter (HOSPITAL_COMMUNITY): Payer: Self-pay | Admitting: Family Medicine

## 2021-10-15 ENCOUNTER — Other Ambulatory Visit: Payer: Self-pay

## 2021-10-15 LAB — CBC
HCT: 39.8 % (ref 36.0–46.0)
Hemoglobin: 13.5 g/dL (ref 12.0–15.0)
MCH: 29.4 pg (ref 26.0–34.0)
MCHC: 33.9 g/dL (ref 30.0–36.0)
MCV: 86.7 fL (ref 80.0–100.0)
Platelets: 277 10*3/uL (ref 150–400)
RBC: 4.59 MIL/uL (ref 3.87–5.11)
RDW: 14.4 % (ref 11.5–15.5)
WBC: 11.6 10*3/uL — ABNORMAL HIGH (ref 4.0–10.5)
nRBC: 0 % (ref 0.0–0.2)

## 2021-10-15 LAB — RENAL FUNCTION PANEL
Albumin: 3.9 g/dL (ref 3.5–5.0)
Anion gap: 9 (ref 5–15)
BUN: 19 mg/dL (ref 8–23)
CO2: 20 mmol/L — ABNORMAL LOW (ref 22–32)
Calcium: 9 mg/dL (ref 8.9–10.3)
Chloride: 113 mmol/L — ABNORMAL HIGH (ref 98–111)
Creatinine, Ser: 0.67 mg/dL (ref 0.44–1.00)
GFR, Estimated: >60 mL/min (ref >60)
Glucose, Bld: 114 mg/dL — ABNORMAL HIGH (ref 70–99)
Phosphorus: 4.3 mg/dL (ref 2.5–4.6)
Potassium: 3.5 mmol/L (ref 3.5–5.1)
Sodium: 142 mmol/L (ref 135–145)

## 2021-10-15 LAB — GLUCOSE, CAPILLARY: Glucose-Capillary: 106 mg/dL — ABNORMAL HIGH (ref 70–99)

## 2021-10-15 MED ORDER — LABETALOL HCL 5 MG/ML IV SOLN
10.0000 mg | INTRAVENOUS | Status: DC | PRN
  Administered 2021-10-15 – 2021-10-16 (×3): 10 mg via INTRAVENOUS
  Filled 2021-10-15 (×3): qty 4

## 2021-10-15 MED ORDER — ENSURE ENLIVE PO LIQD: 237.0000 mL | Freq: Two times a day (BID) | ORAL | Status: DC

## 2021-10-15 MED ORDER — OXYCODONE HCL 5 MG PO TABS
5.0000 mg | ORAL_TABLET | Freq: Three times a day (TID) | ORAL | Status: DC | PRN
  Administered 2021-10-15 – 2021-10-16 (×3): 10 mg via ORAL
  Filled 2021-10-15 (×3): qty 2

## 2021-10-15 MED ORDER — PNEUMOCOCCAL 20-VAL CONJ VACC 0.5 ML IM SUSY
0.5000 mL | PREFILLED_SYRINGE | INTRAMUSCULAR | Status: DC
  Filled 2021-10-15: qty 0.5

## 2021-10-15 MED ORDER — POTASSIUM CHLORIDE 10 MEQ/100ML IV SOLN
10.0000 meq | INTRAVENOUS | Status: AC
  Administered 2021-10-15 (×4): 10 meq via INTRAVENOUS
  Filled 2021-10-15 (×4): qty 100

## 2021-10-15 NOTE — Progress Notes (Signed)
PROGRESS NOTE     Jessica Bell, is a 61 y.o. female, DOB - June 19, 1960, XBJ:[id number]  Admit date - 10/13/2021   Admitting Physician Jessica Heritage, DO  Outpatient Primary MD for the patient is Jessica Sams, DO  LOS - 2  Chief Complaint  Patient presents with   Altered Mental Status        Brief Narrative:  61 y.o. female with medical history significant of hypertension, heart failure with preserved EF, hyperlipidemia, COPD, coronary artery disease with history of MI, hypothyroidism, chronic pain on chronic opiate therapy admitted on 10/13/21 with acute metabolic encephalopathy and PRES with severely elevated blood pressure, similar to patient presentation on 09/19/2021    -Assessment and Plan: 1)PRES---(posterior reversible encephalopathy syndrome)-neurology input appreciated -similar to patient presentation on 09/19/2021 -Recent EEG from 09/20/2021 was without seizures or epileptiform findings  -Cardene drip has been weaned off, -Goal is to keep SBP at less than 160 mmhg -Continue topical clonidine patch and oral Coreg -IV labetalol as needed elevated BP  2)COPD/Tobacco Abuse-- -give nicotine patch -Bronchodilators as ordered  3)HFpEF--history of chronic diastolic dysfunction CHF -05/11/2021 echo EF 50-55%, G1DD Distal septal HK -Clinically appears euvolemic/compensated -Restart Coreg when taking oral intake  4)CAD S/P percutaneous coronary angioplasty -Chest pain-free, no ACS type findings  continue Plavix and Lipitor  5) chronic pain syndrome/chronic opiate dependence--continue opiates  6)HTN--doubt compliance with BP meds, we will use clonidine patch which may be easier to comply with given that this is only once a week -BP adjust meds as above #1  7) anxiety disorder--benzos as needed  8) acute metabolic encephalopathy--due to #1 above, failed as needed Haldol and lorazepam, failed wrist restraints -Okay to continue Precedex for agitation and  restlessness  9)Social/ethics--- patient with history of noncompliance, patient with history of signing out AMA, patient with history of chronic opiate and benzo use, --Discussed with patient's significant other who lives with patient Mr. Jessica Bell--- at bedside  CRITICAL CARE Performed by: Jessica Bell   Total critical care time: 41 minutes  Critical care time was exclusive of separately billable procedures and treating other patients.  Critical care was necessary to treat or prevent imminent or life-threatening deterioration. -Severely elevated blood pressure with PRES and acute metabolic encephalopathy requiring IV Precedex drip  Critical care was time spent personally by me on the following activities: development of treatment plan with patient and/or surrogate as well as nursing, discussions with consultants, evaluation of patient's response to treatment, examination of patient, obtaining history from patient or surrogate, ordering and performing treatments and interventions, ordering and review of laboratory studies, ordering and review of radiographic studies, pulse oximetry and re-evaluation of patient's condition.  Disposition/Need for in-Hospital Stay- patient unable to be discharged at this time due to PRES--requiring IV Cardene drip and IV  Status is: Inpatient   Disposition: The patient is from: Home              Anticipated d/c is to: Home              Anticipated d/c date is: 1 day              Patient currently is not medically stable to d/c. Barriers: Not Clinically Stable-   Code Status :  -  Code Status: Full Code   Family Communication:  --Discussed with patient's significant other who lives with patient Mr. Jessica Bell--- at bedside  DVT Prophylaxis  :   - SCDs   enoxaparin (LOVENOX) injection 40  mg Start: 10/13/21 2000   Lab Results  Component Value Date   PLT 277 10/15/2021   Inpatient Medications  Scheduled Meds:  atorvastatin  80 mg Oral QPM    carvedilol  12.5 mg Oral BID WC   Chlorhexidine Gluconate Cloth  6 each Topical Q0600   cloNIDine  0.2 mg Transdermal Weekly   clopidogrel  75 mg Oral Daily   enoxaparin (LOVENOX) injection  40 mg Subcutaneous Q24H   fluticasone  1 spray Each Nare Daily   fluticasone furoate-vilanterol  1 puff Inhalation Daily   folic acid  1 mg Intravenous Daily   levothyroxine  100 mcg Oral Q0600   nicotine  21 mg Transdermal Daily   thiamine injection  100 mg Intravenous Daily   Continuous Infusions:  dexmedetomidine (PRECEDEX) IV infusion 0.4 mcg/kg/hr (10/15/21 0737)   niCARDipine     potassium chloride 10 mEq (10/15/21 1142)   PRN Meds:.albuterol, haloperidol lactate, hydrALAZINE, morphine injection, [DISCONTINUED] ondansetron **OR** ondansetron (ZOFRAN) IV   Anti-infectives (From admission, onward)    None       Subjective: Jessica ItoLaura Bell today has no fevers, no emesis,  No chest pain,   - more cooperative, much less confused , still on Precedex drip at lower dose, no obvious focal deficits at this time -Discussed with patient's significant other who lives with patient Mr. Jessica AmelAllen Bell--- at bedside -  Objective: Vitals:   10/15/21 0900 10/15/21 1000 10/15/21 1100 10/15/21 1200  BP: (!) 111/50 (!) 143/61 (!) 131/34   Pulse: 73 65 73 75  Resp: 17 20 15  (!) 21  Temp:    98.8 F (37.1 C)  TempSrc:    Axillary  SpO2: 94% 95% 92% 95%  Weight:      Height:        Intake/Output Summary (Last 24 hours) at 10/15/2021 1209 Last data filed at 10/15/2021 0813 Gross per 24 hour  Intake 758.78 ml  Output 300 ml  Net 458.78 ml   Filed Weights   10/13/21 1103 10/13/21 1937 10/14/21 0600  Weight: 75 kg 73.2 kg 74.1 kg    Physical Exam  Gen:- Awake , alert, much less confused, cooperative  HEENT:- [place].AT, No sclera icterus Neck-Supple Neck,No JVD,.  Lungs-  CTAB , fair symmetrical air movement CV- S1, S2 normal, regular  Abd-  +ve B.Sounds, Abd Soft, No tenderness,    Extremity/Skin:- No   edema, pedal pulses present  NeuroPsych-more cooperative, much less confused , still on Precedex drip at lower dose, no obvious focal deficits at this time  Data Reviewed: I have personally reviewed following labs and imaging studies  CBC: Recent Labs  Lab 10/13/21 1024 10/13/21 1053 10/14/21 0428 10/15/21 0425  WBC 11.1*  --  16.1* 11.6*  NEUTROABS 9.1*  --   --   --   HGB 15.7* 16.0* 14.5 13.5  HCT 46.0 47.0* 41.5 39.8  MCV 86.6  --  85.6 86.7  PLT 356  --  330 277   Basic Metabolic Panel: Recent Labs  Lab 10/13/21 1024 10/13/21 1053 10/14/21 0428 10/15/21 0424  NA 139 138 139 142  K 3.7 3.7 3.3* 3.5  CL 102 103 107 113*  CO2 24  --  21* 20*  GLUCOSE 108* 107* 102* 114*  BUN 9 8 13 19   CREATININE 0.70 0.70 0.81 0.67  CALCIUM 9.6  --  9.2 9.0  PHOS  --   --   --  4.3   GFR: Estimated Creatinine Clearance: 72.9 mL/min (by  C-G formula based on SCr of 0.67 mg/dL). Liver Function Tests: Recent Labs  Lab 10/13/21 1024 10/15/21 0424  AST 21  --   ALT 18  --   ALKPHOS 101  --   BILITOT 0.9  --   PROT 7.7  --   ALBUMIN 4.3 3.9   Cardiac Enzymes: No results for input(s): CKTOTAL, CKMB, CKMBINDEX, TROPONINI in the last 168 hours. BNP (last 3 results) No results for input(s): PROBNP in the last 8760 hours. HbA1C: No results for input(s): HGBA1C in the last 72 hours. Sepsis Labs: (procalcitonin:4,lacticidven:4) ) Recent Results (from the past 240 hour(s))  Culture, blood (Routine x 2)     Status: None (Preliminary result)   Collection Time: 10/13/21 10:51 AM   Specimen: Left Antecubital; Blood  Result Value Ref Range Status   Specimen Description LEFT ANTECUBITAL  Final   Special Requests   Final    BOTTLES DRAWN AEROBIC AND ANAEROBIC Blood Culture results may not be optimal due to an inadequate volume of blood received in culture bottles   Culture   Final    NO GROWTH 2 DAYS Performed at [facility], [address] [place], [place] [address]     Report Status PENDING  Incomplete  Culture, blood (Routine x 2)     Status: None (Preliminary result)   Collection Time: 10/13/21 10:51 AM   Specimen: Right Antecubital; Blood  Result Value Ref Range Status   Specimen Description RIGHT ANTECUBITAL  Final   Special Requests   Final    BOTTLES DRAWN AEROBIC AND ANAEROBIC Blood Culture results may not be optimal due to an inadequate volume of blood received in culture bottles   Culture   Final    NO GROWTH 2 DAYS Performed at [facility], [address] [place], [place] [address]    Report Status PENDING  Incomplete  Resp Panel by RT-PCR (Flu A&B, Covid) Anterior Nasal Swab     Status: None   Collection Time: 10/13/21 10:51 AM   Specimen: Anterior Nasal Swab  Result Value Ref Range Status   SARS Coronavirus 2 by RT PCR NEGATIVE NEGATIVE Final    Comment: (NOTE) SARS-CoV-2 target nucleic acids are NOT DETECTED.  The SARS-CoV-2 RNA is generally detectable in upper respiratory specimens during the acute phase of infection. The lowest concentration of SARS-CoV-2 viral copies this assay can detect is 138 copies/mL. A negative result does not preclude SARS-Cov-2 infection and should not be used as the sole basis for treatment or other patient management decisions. A negative result may occur with  improper specimen collection/handling, submission of specimen other than nasopharyngeal swab, presence of viral mutation(s) within the areas targeted by this assay, and inadequate number of viral copies(<138 copies/mL). A negative result must be combined with clinical observations, patient history, and epidemiological information. The expected result is Negative.  Fact Sheet for Patients:  BloggerCourse.com  Fact Sheet for Healthcare Providers:  SeriousBroker.it  This test is no t yet approved or cleared by the [place] FDA and  has been authorized for detection and/or diagnosis of  SARS-CoV-2 by FDA under an Emergency Use Authorization (EUA). This EUA will remain  in effect (meaning this test can be used) for the duration of the COVID-19 declaration under Section 564(b)(1) of the Act, 21 U.S.C.section 360bbb-3(b)(1), unless the authorization is terminated  or revoked sooner.       Influenza A by PCR NEGATIVE NEGATIVE Final   Influenza B by PCR NEGATIVE NEGATIVE Final  Comment: (NOTE) The Xpert Xpress SARS-CoV-2/FLU/RSV plus assay is intended as an aid in the diagnosis of influenza from Nasopharyngeal swab specimens and should not be used as a sole basis for treatment. Nasal washings and aspirates are unacceptable for Xpert Xpress SARS-CoV-2/FLU/RSV testing.  Fact Sheet for Patients: BloggerCourse.com  Fact Sheet for Healthcare Providers: SeriousBroker.it  This test is not yet approved or cleared by the [place] FDA and has been authorized for detection and/or diagnosis of SARS-CoV-2 by FDA under an Emergency Use Authorization (EUA). This EUA will remain in effect (meaning this test can be used) for the duration of the COVID-19 declaration under Section 564(b)(1) of the Act, 21 U.S.C. section 360bbb-3(b)(1), unless the authorization is terminated or revoked.  Performed at [facility], [address], [place], [place] [address]   Urine Culture     Status: None   Collection Time: 10/13/21 10:51 AM   Specimen: Urine, Clean Catch  Result Value Ref Range Status   Specimen Description   Final    URINE, CLEAN CATCH Performed at [facility], [address], [place], [place] [address]    Special Requests   Final    NONE Performed at [facility], [address], [place], [place] [address]    Culture   Final    NO GROWTH Performed at [facility], [address] [place], [place] [address]    Report Status 10/14/2021 FINAL  Final  MRSA Next Gen by PCR, Nasal     Status: None   Collection Time:  10/13/21  7:01 PM   Specimen: Nasal Mucosa; Nasal Swab  Result Value Ref Range Status   MRSA by PCR Next Gen NOT DETECTED NOT DETECTED Final    Comment: (NOTE) The GeneXpert MRSA Assay (FDA approved for NASAL specimens only), is one component of a comprehensive MRSA colonization surveillance program. It is not intended to diagnose MRSA infection nor to guide or monitor treatment for MRSA infections. Test performance is not FDA approved in patients less than 37 years old. Performed at [facility], [address], [place], [place] [address]     Radiology Studies: CT Head Wo Contrast  Result Date: 10/13/2021 CLINICAL DATA:  Altered mental status. EXAM: CT HEAD WITHOUT CONTRAST TECHNIQUE: Contiguous axial images were obtained from the base of the skull through the vertex without intravenous contrast. RADIATION DOSE REDUCTION: This exam was performed according to the departmental dose-optimization program which includes automated exposure control, adjustment of the mA and/or kV according to patient size and/or use of iterative reconstruction technique. COMPARISON:  MRI Sep 20, 2021 and CT Sep 19, 2021 FINDINGS: Brain: Similar appearance of the hypodensity in the bilateral parieto-occipital regions and cerebellar hemisphere, possibly reflecting vasogenic edema as seen in the setting of PRES. No evidence of intracranial hemorrhage. No hydrocephalus. Vascular: No hyperdense vessel or unexpected calcification. Skull: Normal. Negative for fracture or focal lesion. Sinuses/Orbits: Visualized portions of the paranasal sinuses are predominantly clear. Orbits are unremarkable. Other: Mastoid air cells are predominantly clear. IMPRESSION: Similar appearance of the hypodensity in the bilateral parieto-occipital regions and cerebellar hemispheres, possibly reflecting vasogenic edema as seen in the setting of PRES. Electronically Signed   By: Maudry Mayhew M.D.   On: 10/13/2021 15:10   CT ABDOMEN PELVIS W  CONTRAST  Result Date: 10/14/2021 CLINICAL DATA:  Abdominal pain, acute, nonlocalized EXAM: CT ABDOMEN AND PELVIS WITH CONTRAST TECHNIQUE: Multidetector CT imaging of the abdomen and pelvis was performed using the standard protocol following bolus administration of  intravenous contrast. RADIATION DOSE REDUCTION: This exam was performed according to the departmental dose-optimization program which includes automated exposure control, adjustment of the mA and/or kV according to patient size and/or use of iterative reconstruction technique. CONTRAST:  OMNIPAQUE IOHEXOL 300 MG/ML SOLN, OMNIPAQUE IOHEXOL 300 MG/ML SOLN COMPARISON:  None Available. FINDINGS: Lower chest: No acute abnormality. Hepatobiliary: 1.2 cm simple cyst within the inferior right hepatic lobe. There are a few additional scattered subcentimeter low-density lesions within the liver, too small to characterize. Unremarkable gallbladder. No hyperdense gallstone. No biliary dilatation. Pancreas: Unremarkable. No pancreatic ductal dilatation or surrounding inflammatory changes. Spleen: Normal in size without focal abnormality. Adrenals/Urinary Tract: Unremarkable adrenal glands. Atrophic right kidney containing multiple small cysts, no follow-up required. Compensatory hypertrophy of the left kidney. No renal stone or hydronephrosis. Urinary bladder is largely obscured by metallic hardware in the pelvis. Stomach/Bowel: Stomach is within normal limits. Appendix appears normal. Extensive sigmoid diverticulosis. Long segment mild wall thickening of the sigmoid colon. No pericolonic fat stranding or fluid. No abnormally dilated loops of bowel. Vascular/Lymphatic: Aortic atherosclerosis. No enlarged abdominal or pelvic lymph nodes. Reproductive: Status post hysterectomy. No adnexal masses. Other: No free fluid. No abdominopelvic fluid collection. No pneumoperitoneum. Tiny fat containing umbilical hernia. Musculoskeletal: Prior ORIF of the pubic  symphysis, prior right SI joint fusion, and right total hip arthroplasty. Severe osteoarthritis of the left hip. No acute osseous findings. Facet predominant lumbar spondylosis. IMPRESSION: 1. Extensive sigmoid diverticulosis with a long segment mild wall thickening of the sigmoid colon without associated pericolonic fat stranding or fluid. Findings may represent sequela of chronic diverticulitis. A superimposed mild infectious or inflammatory colitis is also a consideration 2. Atrophic right kidney with compensatory hypertrophy of the left kidney. No evidence of obstructive uropathy. 3. Aortic atherosclerosis (ICD10-I70.0). Electronically Signed   By: Duanne Guess D.O.   On: 10/14/2021 10:27     Scheduled Meds:  atorvastatin  80 mg Oral QPM   carvedilol  12.5 mg Oral BID WC   Chlorhexidine Gluconate Cloth  6 each Topical Q0600   cloNIDine  0.2 mg Transdermal Weekly   clopidogrel  75 mg Oral Daily   enoxaparin (LOVENOX) injection  40 mg Subcutaneous Q24H   fluticasone  1 spray Each Nare Daily   fluticasone furoate-vilanterol  1 puff Inhalation Daily   folic acid  1 mg Intravenous Daily   levothyroxine  100 mcg Oral Q0600   nicotine  21 mg Transdermal Daily   thiamine injection  100 mg Intravenous Daily   Continuous Infusions:  dexmedetomidine (PRECEDEX) IV infusion 0.4 mcg/kg/hr (10/15/21 0737)   niCARDipine     potassium chloride 10 mEq (10/15/21 1142)     LOS: 2 days   Jessica Bell M.D on 10/15/2021 at 12:09 PM  Go to [email or web address] - for contact info  [facility] - Office  [phone]  If 7PM-7AM, please contact night-coverage [email or web address] Password [facility] 10/15/2021, 12:09 PM

## 2021-10-16 ENCOUNTER — Telehealth: Payer: Self-pay | Admitting: *Deleted

## 2021-10-16 ENCOUNTER — Other Ambulatory Visit: Payer: Self-pay | Admitting: Family Medicine

## 2021-10-16 LAB — BASIC METABOLIC PANEL
Anion gap: 5 (ref 5–15)
BUN: 16 mg/dL (ref 8–23)
CO2: 19 mmol/L — ABNORMAL LOW (ref 22–32)
Calcium: 8.4 mg/dL — ABNORMAL LOW (ref 8.9–10.3)
Chloride: 112 mmol/L — ABNORMAL HIGH (ref 98–111)
Creatinine, Ser: 0.59 mg/dL (ref 0.44–1.00)
GFR, Estimated: >60 mL/min (ref >60)
Glucose, Bld: 100 mg/dL — ABNORMAL HIGH (ref 70–99)
Potassium: 3.6 mmol/L (ref 3.5–5.1)
Sodium: 136 mmol/L (ref 135–145)

## 2021-10-16 LAB — CBC
HCT: 36.3 % (ref 36.0–46.0)
Hemoglobin: 12.3 g/dL (ref 12.0–15.0)
MCH: 29.9 pg (ref 26.0–34.0)
MCHC: 33.9 g/dL (ref 30.0–36.0)
MCV: 88.3 fL (ref 80.0–100.0)
Platelets: 252 10*3/uL (ref 150–400)
RBC: 4.11 MIL/uL (ref 3.87–5.11)
RDW: 14.3 % (ref 11.5–15.5)
WBC: 11.6 10*3/uL — ABNORMAL HIGH (ref 4.0–10.5)
nRBC: 0 % (ref 0.0–0.2)

## 2021-10-16 MED ORDER — AMLODIPINE BESYLATE 10 MG PO TABS: 10.0000 mg | ORAL_TABLET | Freq: Every day | ORAL | 3 refills | Status: DC

## 2021-10-16 MED ORDER — NICOTINE 21 MG/24HR TD PT24: 21.0000 mg | MEDICATED_PATCH | Freq: Every day | TRANSDERMAL | 0 refills | Status: AC

## 2021-10-16 MED ORDER — CLONIDINE 0.2 MG/24HR TD PTWK: 0.2000 mg | MEDICATED_PATCH | TRANSDERMAL | 12 refills | Status: DC

## 2021-10-16 MED ORDER — GABAPENTIN 300 MG PO CAPS: 300.0000 mg | ORAL_CAPSULE | Freq: Three times a day (TID) | ORAL | 2 refills | Status: DC

## 2021-10-16 MED ORDER — LEVOTHYROXINE SODIUM 100 MCG PO TABS: 100.0000 ug | ORAL_TABLET | Freq: Every morning | ORAL | 3 refills | Status: AC

## 2021-10-16 MED ORDER — FLUTICASONE-SALMETEROL 113-14 MCG/ACT IN AEPB: 1.0000 | INHALATION_SPRAY | Freq: Two times a day (BID) | RESPIRATORY_TRACT | 3 refills | Status: DC

## 2021-10-16 MED ORDER — LOSARTAN POTASSIUM 100 MG PO TABS: 100.0000 mg | ORAL_TABLET | Freq: Every day | ORAL | 3 refills | Status: DC

## 2021-10-16 MED ORDER — CARVEDILOL 12.5 MG PO TABS: 6.2500 mg | ORAL_TABLET | Freq: Two times a day (BID) | ORAL | 2 refills | Status: DC

## 2021-10-16 MED ORDER — HYDROXYZINE HCL 50 MG PO TABS: 50.0000 mg | ORAL_TABLET | Freq: Three times a day (TID) | ORAL | 0 refills | Status: DC | PRN

## 2021-10-16 MED ORDER — OMEPRAZOLE 20 MG PO CPDR: 20.0000 mg | DELAYED_RELEASE_CAPSULE | Freq: Every day | ORAL | 3 refills | Status: AC

## 2021-10-16 MED ORDER — ALBUTEROL SULFATE HFA 108 (90 BASE) MCG/ACT IN AERS
1.0000 | INHALATION_SPRAY | RESPIRATORY_TRACT | 1 refills | Status: AC | PRN
Start: 2021-10-16 — End: ?

## 2021-10-16 MED ORDER — FOLIC ACID 1 MG PO TABS: 1.0000 mg | ORAL_TABLET | Freq: Every day | ORAL | 3 refills | Status: AC

## 2021-10-16 MED ORDER — THIAMINE HCL 100 MG PO TABS: 100.0000 mg | ORAL_TABLET | Freq: Every day | ORAL | 3 refills | Status: DC

## 2021-10-16 MED ORDER — CLOPIDOGREL BISULFATE 75 MG PO TABS: 75.0000 mg | ORAL_TABLET | Freq: Every day | ORAL | 3 refills | Status: DC

## 2021-10-16 MED ORDER — FERROUS GLUCONATE 324 (38 FE) MG PO TABS: 324.0000 mg | ORAL_TABLET | Freq: Every day | ORAL | 3 refills | Status: DC

## 2021-10-16 NOTE — Progress Notes (Signed)
Patient continues to have severe pain and request to have oxycodone instead of morphine. Oxycodone scheduled three times a day. I told patient if I give her oxycodone instead of morphine it would be her am dose and the next dose will need to wait until the afternoon. Patient states she normally takes oxycontin 15 mg twice a day and oxycodone 10 mg three times a day. Will discuss with day team to see if appropriate to restart home dose. Patient still a little confused GCS 4/4/6. Patient is agreeable to this plan.  Oliver Barre, RN

## 2021-10-16 NOTE — Care Management Important Message (Signed)
Important Message  Patient Details  Name: Jessica Bell MRN: [id number] Date of Birth: 1960/06/25   Medicare Important Message Given:  Yes     Corey Harold 10/16/2021, 4:35 PM

## 2021-10-16 NOTE — Plan of Care (Signed)
  Problem: Acute Rehab PT Goals(only PT should resolve) Goal: Pt Will Go Supine/Side To Sit Outcome: Progressing Flowsheets (Taken 10/16/2021 1228) Pt will go Supine/Side to Sit: with modified independence Goal: Patient Will Transfer Sit To/From Stand Outcome: Progressing Flowsheets (Taken 10/16/2021 1228) Patient will transfer sit to/from stand:  with modified independence  with supervision Goal: Pt Will Transfer Bed To Chair/Chair To Bed Outcome: Progressing Flowsheets (Taken 10/16/2021 1228) Pt will Transfer Bed to Chair/Chair to Bed:  with modified independence  with supervision Goal: Pt Will Ambulate Outcome: Progressing Flowsheets (Taken 10/16/2021 1228) Pt will Ambulate:  50 feet  with supervision   12:28 PM, 10/16/21 Lonell Grandchild, MPT Physical Therapist with [facility] 336 [phone] office [phone] mobile phone

## 2021-10-16 NOTE — Discharge Instructions (Signed)
1)Please take your Blood Pressure Medications as prescribed 2)Please follow-up with pain clinic specialist for your pain medications 3) follow-up with primary care physician within a week--  4) abstinence from tobacco advised----okay to use nicotine patch to help you quit smoking

## 2021-10-16 NOTE — Evaluation (Signed)
Physical Therapy Evaluation Patient Details Name: Jessica Bell MRN: [id number] DOB: December 04, 1960 Today's Date: 10/16/2021  History of Present Illness  Jessica Bell is a 61 y.o. female with medical history significant of hypertension, heart failure with preserved EF, hyperlipidemia, coronary artery disease with history of MI, hypothyroidism, chronic pain on chronic opiate therapy.  Per the patient's boyfriend, patient was unresponsive this morning.  EMS was called.  When she responded, the patient responded to voice, ambulated to the stretcher, was orientated to self.  Reportedly, the patient was hypotensive and had a strong smell of urine.  Patient was transported here for evaluation.  On arrival, the patient was very combative and not orientated.  Her blood pressure was noted to be 200/100.  She was given haldol for agitation with good effect.  CT head showed changes significant for vasogenic edema for PRES. other work-up was relatively normal.  CBC shows slightly elevated white count.  Urinalysis was negative for obvious UTI.  Urine culture and blood cultures were performed.   Clinical Impression  Patient functioning near baseline for functional mobility and gait demonstrating good return for bed mobility, transfers and taking steps in room using RW without loss of balance.  Patient limited mostly due to fatigue and tolerated sitting up in chair after therapy - RN notified.  Patient will benefit from continued skilled physical therapy in hospital and recommended venue below to increase strength, balance, endurance for safe ADLs and gait.         Recommendations for follow up therapy are one component of a multi-disciplinary discharge planning process, led by the attending physician.  Recommendations may be updated based on patient status, additional functional criteria and insurance authorization.  Follow Up Recommendations Home health PT    Assistance Recommended at Discharge Set up  Supervision/Assistance  Patient can return home with the following  A little help with walking and/or transfers;A little help with bathing/dressing/bathroom;Help with stairs or ramp for entrance;Assistance with cooking/housework    Equipment Recommendations None recommended by PT  Recommendations for Other Services       Functional Status Assessment Patient has had a recent decline in their functional status and demonstrates the ability to make significant improvements in function in a reasonable and predictable amount of time.     Precautions / Restrictions Precautions Precautions: Fall Restrictions Weight Bearing Restrictions: No      Mobility  Bed Mobility Overal bed mobility: Needs Assistance Bed Mobility: Supine to Sit     Supine to sit: Min guard     General bed mobility comments: slightly labored movement    Transfers Overall transfer level: Needs assistance Equipment used: Rolling walker (2 wheels), None Transfers: Sit to/from Stand, Bed to chair/wheelchair/BSC Sit to Stand: Supervision, Min guard   Step pivot transfers: Supervision, Min guard       General transfer comment: good return for transferring to chair without AD and using RW    Ambulation/Gait Ambulation/Gait assistance: Min guard Gait Distance (Feet): 10 Feet Assistive device: Rolling walker (2 wheels) Gait Pattern/deviations: Decreased step length - right, Decreased step length - left, Decreased stride length, Trunk flexed Gait velocity: decreased     General Gait Details: slightly labored cadence without loss of balance and limited mostly due to c/o fatigue  Stairs            Wheelchair Mobility    Modified Rankin (Stroke Patients Only)       Balance Overall balance assessment: Needs assistance Sitting-balance support: Feet supported, No  upper extremity supported Sitting balance-Leahy Scale: Good Sitting balance - Comments: seated at EOB   Standing balance support: During  functional activity, No upper extremity supported Standing balance-Leahy Scale: Poor Standing balance comment: fair/good using RW                             Pertinent Vitals/Pain Pain Assessment Pain Assessment: No/denies pain    Home Living Family/patient expects to be discharged to:: Private residence Living Arrangements: Spouse/significant other (Boyfriend) Available Help at Discharge: Family;Available 24 hours/day Type of Home: House Home Access: Stairs to enter Entrance Stairs-Rails: Right Entrance Stairs-Number of Steps: 3   Home Layout: One level Home Equipment: Agricultural consultantolling Walker (2 wheels);Standard Walker;Cane - single point;BSC/3in1;Shower seat      Prior Function Prior Level of Function : Independent/Modified Independent             Mobility Comments: household and short distanced community ambulator using RW PRN ADLs Comments: Independent     Hand Dominance   Dominant Hand: Right    Extremity/Trunk Assessment   Upper Extremity Assessment Upper Extremity Assessment: Generalized weakness    Lower Extremity Assessment Lower Extremity Assessment: Generalized weakness    Cervical / Trunk Assessment Cervical / Trunk Assessment: Normal  Communication   Communication: No difficulties  Cognition Arousal/Alertness: Awake/alert Behavior During Therapy: WFL for tasks assessed/performed Overall Cognitive Status: Within Functional Limits for tasks assessed                                          General Comments      Exercises     Assessment/Plan    PT Assessment Patient needs continued PT services  PT Problem List Decreased activity tolerance;Decreased strength;Decreased balance;Decreased mobility       PT Treatment Interventions DME instruction;Gait training;Stair training;Therapeutic activities;Therapeutic exercise;Functional mobility training;Patient/family education;Balance training    PT Goals (Current goals can be  found in the Care Plan section)  Acute Rehab PT Goals Patient Stated Goal: return home with family to assist PT Goal Formulation: With patient Time For Goal Achievement: 10/19/21 Potential to Achieve Goals: Good    Frequency Min 3X/week     Co-evaluation               AM-PAC PT "6 Clicks" Mobility  Outcome Measure Help needed turning from your back to your side while in a flat bed without using bedrails?: None Help needed moving from lying on your back to sitting on the side of a flat bed without using bedrails?: A Little Help needed moving to and from a bed to a chair (including a wheelchair)?: A Little Help needed standing up from a chair using your arms (e.g., wheelchair or bedside chair)?: A Little Help needed to walk in hospital room?: A Little Help needed climbing 3-5 steps with a railing? : A Lot 6 Click Score: 18    End of Session   Activity Tolerance: Patient tolerated treatment well;Patient limited by fatigue Patient left: in chair;with call bell/phone within reach Nurse Communication: Mobility status PT Visit Diagnosis: Unsteadiness on feet (R26.81);Other abnormalities of gait and mobility (R26.89);Muscle weakness (generalized) (M62.81)    Time: [id number] PT Time Calculation (min) (ACUTE ONLY): 20 min   Charges:   PT Evaluation $PT Eval Moderate Complexity: 1 Mod PT Treatments $Therapeutic Activity: 8-22 mins  12:27 PM, 10/16/21 Ocie Bob, MPT Physical Therapist with [facility] [phone] office [phone] mobile phone

## 2021-10-16 NOTE — TOC Transition Note (Signed)
Transition of Care [facility]) - CM/SW Discharge Note   Patient Details  Name: Jessica Bell MRN: [id number] Date of Birth: June 03, 1960  [facility] Care [facility]) CM/SW Contact:  Elliot Gault, LCSW Phone Number: 10/16/2021, 3:49 PM   Clinical Narrative:     Pt stable for dc today per MD. PT recommending HH and pt in agreement. CMS provider options reviewed. Referred as requested. Unable to find a HH agency to accept pt due to insurance and/or location of her residence and staffing issues. Pt referred for outpatient PT instead. Updated RN. No other TOC needs for dc.  Final next level of care: Home w Home Health Services Barriers to Discharge: Barriers Resolved   Patient Goals and CMS Choice Patient states their goals for this hospitalization and ongoing recovery are:: get better CMS Medicare.gov Compare Post Acute Care list provided to:: Patient Choice offered to / list presented to : Patient  Discharge Placement                       Discharge Plan and Services In-house Referral: Clinical Social Work                        HH Arranged: PT HH Agency: [facility] Date [facility] Agency Contacted: 10/16/21   Representative spoke with at [facility] Agency: Minerva Areola  Social Determinants of Health (SDOH) Interventions     Readmission Risk Interventions    10/16/2021   11:41 AM 08/24/2021   11:58 AM 05/11/2021    1:36 PM  Readmission Risk Prevention Plan  Transportation Screening Complete Complete Complete  HRI or Home Care Consult   Complete  Social Work Consult for Recovery Care Planning/Counseling   Complete  Palliative Care Screening   Not Applicable  Medication Review Oceanographer) Complete Complete Complete  HRI or Home Care Consult Complete Complete   SW Recovery Care/Counseling Consult Complete Complete   Palliative Care Screening Not Applicable Not Applicable   Skilled Nursing Facility Not Applicable Not Applicable

## 2021-10-16 NOTE — Telephone Encounter (Signed)
Patient called and says she is being discharged from the hospital.  She says they won't give her any pain medication. Is there something that can be sent in for pain.  She has an appointment with Pain Management on 11/04/21

## 2021-10-16 NOTE — TOC Initial Note (Signed)
Transition of Care [facility]) - Initial/Assessment Note    Patient Details  Name: Jessica Bell MRN: [id number] Date of Birth: Aug 09, 1960  Transition of Care [facility]) CM/SW Contact:    Shade Flood, LCSW Phone Number: 10/16/2021, 11:43 AM  Clinical Narrative:                  Pt admitted from home. She has a high readmission risk score. Met with pt at bedside to assess. Per pt, she resides with her sig other and plans to return home at dc. Pt has a walker for DME. Pt reports that her sig other assists her in getting to appointments. She states she is able to obtain medications as needed. Pt is awaiting an appointment at the pain clinic on 6/23. Pt is not anticipating any TOC needs for dc. Will follow and assist if needs arise.  Expected Discharge Plan: Home/Self Care Barriers to Discharge: Continued Medical Work up   Patient Goals and CMS Choice Patient states their goals for this hospitalization and ongoing recovery are:: get better      Expected Discharge Plan and Services Expected Discharge Plan: Home/Self Care In-house Referral: Clinical Social Work     Living arrangements for the past 2 months: Single Family Home                                      Prior Living Arrangements/Services Living arrangements for the past 2 months: Single Family Home Lives with:: Significant Other Patient language and need for interpreter reviewed:: Yes Do you feel safe going back to the place where you live?: Yes      Need for Family Participation in Patient Care: No (Comment)   Current home services: DME Criminal Activity/Legal Involvement Pertinent to Current Situation/Hospitalization: No - Comment as needed  Activities of Daily Living Home Assistive Devices/Equipment: Walker (specify type) ADL Screening (condition at time of admission) Patient's cognitive ability adequate to safely complete daily activities?: Yes Is the patient deaf or have difficulty hearing?: No Does the patient  have difficulty seeing, even when wearing glasses/contacts?: No Does the patient have difficulty concentrating, remembering, or making decisions?: Yes Patient able to express need for assistance with ADLs?: Yes Does the patient have difficulty dressing or bathing?: Yes Independently performs ADLs?: No Communication: Independent Dressing (OT): Needs assistance Is this a change from baseline?: Change from baseline, expected to last <3days Grooming: Needs assistance Is this a change from baseline?: Change from baseline, expected to last <3 days Feeding: Independent Bathing: Needs assistance Is this a change from baseline?: Change from baseline, expected to last <3 days Toileting: Needs assistance Is this a change from baseline?: Change from baseline, expected to last <3 days In/Out Bed: Needs assistance Is this a change from baseline?: Change from baseline, expected to last <3 days Walks in Home: Independent Does the patient have difficulty walking or climbing stairs?: Yes Weakness of Legs: Both Weakness of Arms/Hands: Both  Permission Sought/Granted                  Emotional Assessment Appearance:: Appears stated age Attitude/Demeanor/Rapport: Engaged Affect (typically observed): Pleasant Orientation: : Oriented to Self, Oriented to Place, Oriented to  Time, Oriented to Situation Alcohol / Substance Use: Not Applicable Psych Involvement: No (comment)  Admission diagnosis:  Acute encephalopathy [G93.40] PRES (posterior reversible encephalopathy syndrome) [I67.83] Patient Active Problem List   Diagnosis Date Noted   Acute  encephalopathy 10/13/2021   Hypothyroidism 09/28/2021   PRES (posterior reversible encephalopathy syndrome) 09/19/2021   Opioid dependence ([place]) 09/19/2021   Tobacco abuse 09/19/2021   Chronic diastolic CHF (congestive heart failure) ([place]) 08/23/2021   COPD (chronic obstructive pulmonary disease) ([place]) 05/10/2021   Gastroesophageal reflux disease  11/06/2017   History of DVT of lower extremity (2015 in [place]) 05/01/2017   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) 03/20/2017   Antiplatelet or antithrombotic long-term use 03/20/2017   Anxiety, generalized 01/05/2016   CAD S/P percutaneous coronary angioplasty 01/05/2016   Dyslipidemia 01/05/2016   PTSD (post-traumatic stress disorder) 01/05/2016   Chronic pain syndrome 11/09/2015   Essential hypertension 11/09/2015   PCP:  Coral Spikes, DO Pharmacy:   [id number] [place] [place] [id number]: [phone]: [address] [address] [id number]: [phone]: [phone]    Social Determinants of Health (SDOH) Interventions    Readmission Risk Interventions    10/16/2021   11:41 AM 08/24/2021   11:58 AM 05/11/2021    1:36 PM  Readmission Risk Prevention Plan  Transportation Screening Complete Complete Complete  HRI or Home Care Consult   Complete  Social Work Consult for [place]seling   Complete  Palliative Care Screening   Not Applicable  Medication Review (Press photographer Complete Complete Complete  HRI or Home Care Consult Complete Complete   SW Recovery Care/Counseling Consult Complete Complete   Palliative Care Screening Not Applicable Not Applicable   Skilled Nursing Facility Not Applicable Not Applicable

## 2021-10-16 NOTE — Discharge Summary (Signed)
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 associated pericolonic fat stranding or fluid. Findings may represent sequela of chronic diverticulitis. A superimposed mild infectious or inflammatory colitis is also a consideration 2. Atrophic right kidney with compensatory hypertrophy of the left kidney. No evidence of obstructive uropathy. 3. Aortic atherosclerosis (ICD10-I70.0). Electronically Signed   By: Duanne Guess D.O.   On: 10/14/2021 10:27   DG Chest Portable 1 View  Result Date: 10/13/2021 CLINICAL DATA:  Altered mental status. EXAM: PORTABLE CHEST 1 VIEW COMPARISON:  AP chest 09/19/2021; CT chest 05/23/2020 FINDINGS: Cardiac silhouette and mediastinal contours are within normal limits. Mild calcification within the aortic arch. Mild cystic emphysematous changes greatest within the upper lungs. No focal airspace opacity to indicate pneumonia. No pleural effusion pneumothorax. Moderate multilevel degenerative disc changes of the thoracic spine.  IMPRESSION: Chronic cystic emphysematous changes.  No acute lung process. Electronically Signed   By: Neita Garnet M.D.   On: 10/13/2021 10:26   DG Chest Port 1 View  Result Date: 09/19/2021 CLINICAL DATA:  Recent UTI, altered mental status. EXAM: PORTABLE CHEST 1 VIEW COMPARISON:  August 23, 2021. FINDINGS: EKG leads project over the chest. Cardiomediastinal contours and hilar structures are normal. Lungs are clear.  No visible pneumothorax. On limited assessment there is no acute skeletal process. IMPRESSION: No acute cardiopulmonary disease. Electronically Signed   By: Donzetta Kohut M.D.   On: 09/19/2021 07:53   EEG adult  Result Date: 09/20/2021 Charlsie Quest, MD     09/20/2021  9:32 PM Patient Name: BERDELL NEVITT MRN: [id number] Epilepsy Attending: Charlsie Quest Referring Physician/Provider: Catarina Hartshorn, MD Date: 09/20/2021 Duration: 22.35 mins Patient history:  61yo F with ams. EEG to evaluate for seizure. Level of alertness: Awake, asleep AEDs during EEG study: None Technical aspects: This EEG study was done with scalp electrodes positioned according to the 10-20 International system of electrode placement. Electrical activity was  acquired at a sampling rate of  and reviewed with a high frequency filter of  and a low frequency filter of . EEG data were recorded continuously and digitally stored. Description: The posterior dominant rhythm consists of 7.5 Hz activity of moderate voltage (25-35 uV) seen predominantly in posterior head regions, symmetric and reactive to eye opening and eye closing. Sleep was characterized by vertex waves maximal frontocentral region. EEG showed intermittent generalized 3 to 6 Hz theta-delta slowing. Hyperventilation and photic stimulation were not performed.   ABNORMALITY - Intermittent slow, generalized IMPRESSION: This study is suggestive of mild diffuse encephalopathy, nonspecific etiology. No seizures or epileptiform discharges were seen throughout the  recording. Charlsie Quest   [place] EKG SITE RITE  Result Date: 09/19/2021 If Site Rite image not attached, placement could not be confirmed due to current cardiac rhythm.   Micro Results  Recent Results (from the past 240 hour(s))  Culture, blood (Routine x 2)     Status: None (Preliminary result)   Collection Time: 10/13/21 10:51 AM   Specimen: Left Antecubital; Blood  Result Value Ref Range Status   Specimen Description LEFT ANTECUBITAL  Final   Special Requests   Final    BOTTLES DRAWN AEROBIC AND ANAEROBIC Blood Culture results may not be optimal due to an inadequate volume of blood received in culture bottles   Culture   Final    NO GROWTH 3 DAYS Performed at [facility], [address] [place], [place] [address]    Report Status PENDING  Incomplete  Culture, blood (Routine x 2)     Status: None (Preliminary result)   Collection Time: 10/13/21 10:51 AM   Specimen: Right Antecubital; Blood  Result Value Ref Range Status   Specimen Description RIGHT ANTECUBITAL  Final   Special Requests   Final    BOTTLES DRAWN AEROBIC AND ANAEROBIC Blood Culture results may not be optimal due to an inadequate volume of blood received in culture bottles   Culture   Final    NO GROWTH 3 DAYS Performed at [facility], [address], [place], [place] [address]    Report Status PENDING  Incomplete  Resp Panel by RT-PCR (Flu A&B, Covid) Anterior Nasal Swab     Status: None   Collection Time: 10/13/21 10:51 AM   Specimen: Anterior Nasal Swab  Result Value Ref Range Status   SARS Coronavirus 2 by RT PCR NEGATIVE NEGATIVE Final    Comment: (NOTE) SARS-CoV-2 target nucleic acids are NOT DETECTED.  The SARS-CoV-2 RNA is generally detectable in upper respiratory specimens during the acute phase of infection. The lowest concentration of SARS-CoV-2 viral copies this assay can detect is 138 copies/mL. A negative result does not preclude SARS-Cov-2 infection and should not be used as the sole basis for  treatment or other patient management decisions. A negative result may occur with  improper specimen collection/handling, submission of specimen other than nasopharyngeal swab, presence of viral mutation(s) within the areas targeted by this assay, and inadequate number of viral copies(<138 copies/mL). A negative result must be combined with clinical observations, patient history, and epidemiological information. The expected result is Negative.  Fact Sheet for Patients:  BloggerCourse.com  Fact Sheet for Healthcare Providers:  SeriousBroker.it  This test is no t yet approved or cleared by the [place] FDA and  has been authorized for detection and/or diagnosis of SARS-CoV-2 by FDA under an Emergency Use Authorization (EUA). This EUA will remain  in effect (meaning this test can be used)  for the duration of the COVID-19 declaration under Section 564(b)(1) of the Act, 21 U.S.C.section 360bbb-3(b)(1), unless the authorization is terminated  or revoked sooner.       Influenza A by PCR NEGATIVE NEGATIVE Final   Influenza B by PCR NEGATIVE NEGATIVE Final    Comment: (NOTE) The Xpert Xpress SARS-CoV-2/FLU/RSV plus assay is intended as an aid in the diagnosis of influenza from Nasopharyngeal swab specimens and should not be used as a sole basis for treatment. Nasal washings and aspirates are unacceptable for Xpert Xpress SARS-CoV-2/FLU/RSV testing.  Fact Sheet for Patients: BloggerCourse.com  Fact Sheet for Healthcare Providers: SeriousBroker.it  This test is not yet approved or cleared by the [place] FDA and has been authorized for detection and/or diagnosis of SARS-CoV-2 by FDA under an Emergency Use Authorization (EUA). This EUA will remain in effect (meaning this test can be used) for the duration of the COVID-19 declaration under Section 564(b)(1) of the Act, 21  U.S.C. section 360bbb-3(b)(1), unless the authorization is terminated or revoked.  Performed at [facility], [address], [place], [place] [address]   Urine Culture     Status: None   Collection Time: 10/13/21 10:51 AM   Specimen: Urine, Clean Catch  Result Value Ref Range Status   Specimen Description   Final    URINE, CLEAN CATCH Performed at [facility], [address], [place], [place] [address]    Special Requests   Final    NONE Performed at [facility], [address], [place], [place] [address]    Culture   Final    NO GROWTH Performed at [facility], [address] [place], [place] [address]    Report Status 10/14/2021 FINAL  Final  MRSA Next Gen by PCR, Nasal     Status: None   Collection Time: 10/13/21  7:01 PM   Specimen: Nasal Mucosa; Nasal Swab  Result Value Ref Range Status   MRSA by PCR Next Gen NOT DETECTED NOT DETECTED Final    Comment: (NOTE) The GeneXpert MRSA Assay (FDA approved for NASAL specimens only), is one component of a comprehensive MRSA colonization surveillance program. It is not intended to diagnose MRSA infection nor to guide or monitor treatment for MRSA infections. Test performance is not FDA approved in patients less than 75 years old. Performed at [facility], [address], [place], [place] [address]     Today   Subjective    Sharina Petre today has no new complaints No fever  Or chills   No Nausea, Vomiting or Diarrhea Blood seen around in the ICU without chest pains, palpitations, dizziness or dyspnea         Patient has been seen and examined prior to discharge   Objective   Blood pressure (!) 186/77, pulse 75, temperature 98 F (36.7 C), temperature source Oral, resp. rate (!) 22, height 5\' 4"  (1.626 m), weight 71.9 kg, SpO2 100 %.   Intake/Output Summary (Last 24 hours) at 10/16/2021 1431 Last data filed at 10/16/2021 0600 Gross per 24 hour  Intake 564.77 ml  Output 800 ml  Net -235.23 ml   Exam Gen:-  Awake Alert, no acute distress  HEENT:- [place].AT, No sclera icterus Neck-Supple Neck,No JVD,.  Lungs-  CTAB , good air movement bilaterally CV- S1, S2 normal, regular Abd-  +ve B.Sounds, Abd Soft, No tenderness,    Extremity/Skin:- No  edema,   good pulses Psych-affect is appropriate, oriented x3 Neuro-no new focal deficits, no tremors  Data Review   CBC w Diff:  Lab Results  Component Value Date   WBC 11.6 (H) 10/16/2021   HGB 12.3 10/16/2021   HCT 36.3 10/16/2021   PLT 252 10/16/2021   LYMPHOPCT 12 10/13/2021   MONOPCT 5 10/13/2021   EOSPCT 1 10/13/2021   BASOPCT 0 10/13/2021    CMP:  Lab Results  Component Value Date   NA 136 10/16/2021   NA 141 03/05/2017   K 3.6 10/16/2021   CL 112 (H) 10/16/2021   CO2 19 (L) 10/16/2021   BUN 16 10/16/2021   BUN 12 03/05/2017   CREATININE 0.59 10/16/2021   PROT 7.7 10/13/2021   PROT 6.8 03/05/2017   ALBUMIN 3.9 10/15/2021   ALBUMIN 4.5 03/05/2017   BILITOT 0.9 10/13/2021   BILITOT <0.2 03/05/2017   ALKPHOS 101 10/13/2021   AST 21 10/13/2021   ALT 18 10/13/2021  .  Total Discharge time is about 33 minutes  Shon Hale M.D on 10/16/2021 at 2:31 PM  Go to [email or web address] -  for contact info  [facility] - Office  [phone]

## 2021-10-16 NOTE — Telephone Encounter (Signed)
Patient informed of md message. Verbalized understanding. ?

## 2021-10-17 ENCOUNTER — Inpatient Hospital Stay: Payer: Medicare Other | Admitting: Neurology

## 2021-10-17 ENCOUNTER — Encounter: Payer: Self-pay | Admitting: Neurology

## 2021-10-18 ENCOUNTER — Telehealth: Payer: Self-pay

## 2021-10-18 LAB — CULTURE, BLOOD (ROUTINE X 2)
Culture: NO GROWTH
Culture: NO GROWTH

## 2021-10-18 NOTE — Telephone Encounter (Signed)
Patient calls stating she has an appt on 10/23/21 for hospital follow up , she is currently in a lot of pain and having a difficult time just taking a shower, would like to know if she could have a sooner appt to address pain or a renewal of oxycontin 15 mg and oxycodone 10 mg to tie her over until her appt. Pt is aware she has an appt with pain management also.

## 2021-10-18 NOTE — Telephone Encounter (Signed)
Transition Care Management Follow-up Telephone Call Date of discharge and from where: [facility] 10/16/2021 How have you been since you were released from the hospital? Pt states she is having issues with pain but can't get into her new Pain Management Doctor until 11/03/2021. Any questions or concerns? No  Items Reviewed: Did the pt receive and understand the discharge instructions provided? Yes  Medications obtained and verified? Yes  Other? No  Any new allergies since your discharge? No  Dietary orders reviewed? Yes Do you have support at home? Yes   Home Care and Equipment/Supplies: Were home health services ordered? no If so, what is the name of the agency? N/A  Has the agency set up a time to come to the patient's home? not applicable Were any new equipment or medical supplies ordered?  No What is the name of the medical supply agency? N/A Were you able to get the supplies/equipment? not applicable Do you have any questions related to the use of the equipment or supplies? No  Functional Questionnaire: (I = Independent and D = Dependent) ADLs: I  Bathing/Dressing- I  Meal Prep- I  Eating- I  Maintaining continence- I  Transferring/Ambulation- I  Managing Meds- I  Follow up appointments reviewed:  PCP Hospital f/u appt confirmed? Yes  Scheduled to see Dr. Adriana Simas on 10/23/2021 @ 2:50. Specialist Hospital f/u appt confirmed? Yes  Scheduled to see Pain Management on 11/03/2021 @ 2:00. Are transportation arrangements needed? No  If their condition worsens, is the pt aware to call PCP or go to the Emergency Dept.? Yes Was the patient provided with contact information for the PCP's office or ED? Yes Was to pt encouraged to call back with questions or concerns? Yes

## 2021-10-18 NOTE — Telephone Encounter (Signed)
Patient has been informed per drs previous message, pt verbalizes understanding.

## 2021-10-23 ENCOUNTER — Encounter (HOSPITAL_COMMUNITY): Payer: Self-pay

## 2021-10-23 ENCOUNTER — Inpatient Hospital Stay: Payer: Self-pay | Admitting: Family Medicine

## 2021-10-23 ENCOUNTER — Ambulatory Visit (HOSPITAL_COMMUNITY): Payer: Medicare Other

## 2021-10-25 ENCOUNTER — Telehealth: Payer: Self-pay | Admitting: *Deleted

## 2021-10-25 NOTE — Telephone Encounter (Signed)
Patient says her blood pressure is running low (74/33,64/33).  She is feeling weak. She wants to know what medication she should stop taking.  Advised pt spoke with Dr. Adriana Simas and he advised with everything she has been through, she needs to go back to hospital. Pt verbalized understanding.

## 2021-12-14 NOTE — Telephone Encounter (Signed)
error 

## 2022-01-02 ENCOUNTER — Ambulatory Visit: Payer: Medicare Other | Admitting: Family Medicine

## 2022-02-15 ENCOUNTER — Telehealth: Payer: Self-pay | Admitting: Family Medicine

## 2022-02-15 NOTE — Telephone Encounter (Signed)
Left message for patient to call back and schedule Medicare Annual Wellness Visit (AWV).  Please offer to do virtually or by telephone.  No hx of AWV eligible for AWVI per [facility] as of 11201/2021  Please schedule at any time with [facility]Nurse Health Advisor.      45 minute appointment   Any questions, please call me at [phone]

## 2022-07-13 ENCOUNTER — Other Ambulatory Visit: Payer: Self-pay | Admitting: Family Medicine

## 2022-07-13 DIAGNOSIS — Z1231 Encounter for screening mammogram for malignant neoplasm of breast: Secondary | ICD-10-CM

## 2023-05-22 ENCOUNTER — Encounter: Payer: Self-pay | Admitting: Internal Medicine

## 2023-06-08 ENCOUNTER — Emergency Department (HOSPITAL_COMMUNITY): Payer: Medicare Other

## 2023-06-08 ENCOUNTER — Other Ambulatory Visit: Payer: Self-pay

## 2023-06-08 ENCOUNTER — Encounter (HOSPITAL_COMMUNITY): Payer: Self-pay | Admitting: Emergency Medicine

## 2023-06-08 ENCOUNTER — Inpatient Hospital Stay (HOSPITAL_COMMUNITY)
Admission: EM | Admit: 2023-06-08 | Discharge: 2023-06-16 | DRG: 542 | Disposition: A | Discharge status: Skilled Nursing Facility | Payer: Medicare Other | Attending: Family Medicine | Admitting: Family Medicine

## 2023-06-08 DIAGNOSIS — Z9861 Coronary angioplasty status: Secondary | ICD-10-CM | POA: Diagnosis not present

## 2023-06-08 DIAGNOSIS — G9341 Metabolic encephalopathy: Secondary | ICD-10-CM | POA: Diagnosis present

## 2023-06-08 DIAGNOSIS — G9340 Encephalopathy, unspecified: Secondary | ICD-10-CM | POA: Diagnosis not present

## 2023-06-08 DIAGNOSIS — N943 Premenstrual tension syndrome: Secondary | ICD-10-CM | POA: Diagnosis present

## 2023-06-08 DIAGNOSIS — I10 Essential (primary) hypertension: Secondary | ICD-10-CM | POA: Diagnosis not present

## 2023-06-08 DIAGNOSIS — J9610 Chronic respiratory failure, unspecified whether with hypoxia or hypercapnia: Secondary | ICD-10-CM | POA: Diagnosis present

## 2023-06-08 DIAGNOSIS — I2510 Atherosclerotic heart disease of native coronary artery without angina pectoris: Secondary | ICD-10-CM | POA: Diagnosis present

## 2023-06-08 DIAGNOSIS — G8929 Other chronic pain: Secondary | ICD-10-CM | POA: Diagnosis present

## 2023-06-08 DIAGNOSIS — I5032 Chronic diastolic (congestive) heart failure: Secondary | ICD-10-CM | POA: Diagnosis present

## 2023-06-08 DIAGNOSIS — R339 Retention of urine, unspecified: Secondary | ICD-10-CM | POA: Diagnosis not present

## 2023-06-08 DIAGNOSIS — Z8249 Family history of ischemic heart disease and other diseases of the circulatory system: Secondary | ICD-10-CM

## 2023-06-08 DIAGNOSIS — E785 Hyperlipidemia, unspecified: Secondary | ICD-10-CM | POA: Diagnosis present

## 2023-06-08 DIAGNOSIS — Z720 Tobacco use: Secondary | ICD-10-CM

## 2023-06-08 DIAGNOSIS — E876 Hypokalemia: Secondary | ICD-10-CM | POA: Diagnosis not present

## 2023-06-08 DIAGNOSIS — F419 Anxiety disorder, unspecified: Secondary | ICD-10-CM | POA: Diagnosis present

## 2023-06-08 DIAGNOSIS — M47816 Spondylosis without myelopathy or radiculopathy, lumbar region: Secondary | ICD-10-CM | POA: Diagnosis present

## 2023-06-08 DIAGNOSIS — Y92009 Unspecified place in unspecified non-institutional (private) residence as the place of occurrence of the external cause: Secondary | ICD-10-CM

## 2023-06-08 DIAGNOSIS — Z90710 Acquired absence of both cervix and uterus: Secondary | ICD-10-CM

## 2023-06-08 DIAGNOSIS — J4489 Other specified chronic obstructive pulmonary disease: Secondary | ICD-10-CM | POA: Diagnosis present

## 2023-06-08 DIAGNOSIS — N390 Urinary tract infection, site not specified: Secondary | ICD-10-CM | POA: Diagnosis present

## 2023-06-08 DIAGNOSIS — Z886 Allergy status to analgesic agent status: Secondary | ICD-10-CM

## 2023-06-08 DIAGNOSIS — S32040A Wedge compression fracture of fourth lumbar vertebra, initial encounter for closed fracture: Principal | ICD-10-CM | POA: Diagnosis present

## 2023-06-08 DIAGNOSIS — W19XXXA Unspecified fall, initial encounter: Secondary | ICD-10-CM | POA: Diagnosis present

## 2023-06-08 DIAGNOSIS — I5030 Unspecified diastolic (congestive) heart failure: Secondary | ICD-10-CM | POA: Diagnosis not present

## 2023-06-08 DIAGNOSIS — R338 Other retention of urine: Secondary | ICD-10-CM | POA: Insufficient documentation

## 2023-06-08 DIAGNOSIS — E66811 Obesity, class 1: Secondary | ICD-10-CM | POA: Diagnosis present

## 2023-06-08 DIAGNOSIS — Z800 Family history of malignant neoplasm of digestive organs: Secondary | ICD-10-CM

## 2023-06-08 DIAGNOSIS — Z91148 Patient's other noncompliance with medication regimen for other reason: Secondary | ICD-10-CM

## 2023-06-08 DIAGNOSIS — R52 Pain, unspecified: Secondary | ICD-10-CM | POA: Diagnosis not present

## 2023-06-08 DIAGNOSIS — Z880 Allergy status to penicillin: Secondary | ICD-10-CM

## 2023-06-08 DIAGNOSIS — M4856XA Collapsed vertebra, not elsewhere classified, lumbar region, initial encounter for fracture: Principal | ICD-10-CM | POA: Diagnosis present

## 2023-06-08 DIAGNOSIS — Z66 Do not resuscitate: Secondary | ICD-10-CM | POA: Diagnosis present

## 2023-06-08 DIAGNOSIS — G928 Other toxic encephalopathy: Secondary | ICD-10-CM | POA: Diagnosis not present

## 2023-06-08 DIAGNOSIS — M48061 Spinal stenosis, lumbar region without neurogenic claudication: Secondary | ICD-10-CM | POA: Diagnosis present

## 2023-06-08 DIAGNOSIS — K219 Gastro-esophageal reflux disease without esophagitis: Secondary | ICD-10-CM | POA: Diagnosis present

## 2023-06-08 DIAGNOSIS — E039 Hypothyroidism, unspecified: Secondary | ICD-10-CM | POA: Diagnosis not present

## 2023-06-08 DIAGNOSIS — F1120 Opioid dependence, uncomplicated: Secondary | ICD-10-CM | POA: Diagnosis present

## 2023-06-08 DIAGNOSIS — R451 Restlessness and agitation: Secondary | ICD-10-CM | POA: Diagnosis present

## 2023-06-08 DIAGNOSIS — I252 Old myocardial infarction: Secondary | ICD-10-CM

## 2023-06-08 DIAGNOSIS — E669 Obesity, unspecified: Secondary | ICD-10-CM | POA: Diagnosis present

## 2023-06-08 DIAGNOSIS — S32039A Unspecified fracture of third lumbar vertebra, initial encounter for closed fracture: Secondary | ICD-10-CM | POA: Diagnosis present

## 2023-06-08 DIAGNOSIS — Z7902 Long term (current) use of antithrombotics/antiplatelets: Secondary | ICD-10-CM

## 2023-06-08 DIAGNOSIS — F17210 Nicotine dependence, cigarettes, uncomplicated: Secondary | ICD-10-CM | POA: Diagnosis present

## 2023-06-08 DIAGNOSIS — I6783 Posterior reversible encephalopathy syndrome: Secondary | ICD-10-CM | POA: Diagnosis present

## 2023-06-08 DIAGNOSIS — Z79899 Other long term (current) drug therapy: Secondary | ICD-10-CM

## 2023-06-08 DIAGNOSIS — M159 Polyosteoarthritis, unspecified: Secondary | ICD-10-CM | POA: Diagnosis present

## 2023-06-08 DIAGNOSIS — Z79890 Hormone replacement therapy (postmenopausal): Secondary | ICD-10-CM | POA: Diagnosis not present

## 2023-06-08 DIAGNOSIS — S32040G Wedge compression fracture of fourth lumbar vertebra, subsequent encounter for fracture with delayed healing: Secondary | ICD-10-CM | POA: Diagnosis not present

## 2023-06-08 DIAGNOSIS — R5381 Other malaise: Secondary | ICD-10-CM | POA: Diagnosis present

## 2023-06-08 DIAGNOSIS — S32040K Wedge compression fracture of fourth lumbar vertebra, subsequent encounter for fracture with nonunion: Secondary | ICD-10-CM | POA: Diagnosis not present

## 2023-06-08 DIAGNOSIS — J449 Chronic obstructive pulmonary disease, unspecified: Secondary | ICD-10-CM | POA: Diagnosis present

## 2023-06-08 DIAGNOSIS — J690 Pneumonitis due to inhalation of food and vomit: Secondary | ICD-10-CM | POA: Diagnosis not present

## 2023-06-08 DIAGNOSIS — I110 Hypertensive heart disease with heart failure: Secondary | ICD-10-CM | POA: Diagnosis present

## 2023-06-08 DIAGNOSIS — R4182 Altered mental status, unspecified: Secondary | ICD-10-CM | POA: Diagnosis not present

## 2023-06-08 DIAGNOSIS — Z6831 Body mass index (BMI) 31.0-31.9, adult: Secondary | ICD-10-CM

## 2023-06-08 LAB — BASIC METABOLIC PANEL
Anion gap: 9 (ref 5–15)
BUN: 7 mg/dL — ABNORMAL LOW (ref 8–23)
CO2: 34 mmol/L — ABNORMAL HIGH (ref 22–32)
Calcium: 8.9 mg/dL (ref 8.9–10.3)
Chloride: 92 mmol/L — ABNORMAL LOW (ref 98–111)
Creatinine, Ser: 0.52 mg/dL (ref 0.44–1.00)
GFR, Estimated: >60 mL/min (ref >60)
Glucose, Bld: 105 mg/dL — ABNORMAL HIGH (ref 70–99)
Potassium: 4.1 mmol/L (ref 3.5–5.1)
Sodium: 135 mmol/L (ref 135–145)

## 2023-06-08 LAB — CBC WITH DIFFERENTIAL/PLATELET
Abs Immature Granulocytes: 0.03 10*3/uL (ref 0.00–0.07)
Basophils Absolute: 0 10*3/uL (ref 0.0–0.1)
Basophils Relative: 0 %
Eosinophils Absolute: 0.1 10*3/uL (ref 0.0–0.5)
Eosinophils Relative: 1 %
HCT: 34.4 % — ABNORMAL LOW (ref 36.0–46.0)
Hemoglobin: 11.5 g/dL — ABNORMAL LOW (ref 12.0–15.0)
Immature Granulocytes: 0 %
Lymphocytes Relative: 11 %
Lymphs Abs: 1.2 10*3/uL (ref 0.7–4.0)
MCH: 29 pg (ref 26.0–34.0)
MCHC: 33.4 g/dL (ref 30.0–36.0)
MCV: 86.9 fL (ref 80.0–100.0)
Monocytes Absolute: 0.8 10*3/uL (ref 0.1–1.0)
Monocytes Relative: 7 %
Neutro Abs: 9.3 10*3/uL — ABNORMAL HIGH (ref 1.7–7.7)
Neutrophils Relative %: 81 %
Platelets: 254 10*3/uL (ref 150–400)
RBC: 3.96 MIL/uL (ref 3.87–5.11)
RDW: 13.7 % (ref 11.5–15.5)
WBC: 11.5 10*3/uL — ABNORMAL HIGH (ref 4.0–10.5)
nRBC: 0 % (ref 0.0–0.2)

## 2023-06-08 LAB — URINALYSIS, ROUTINE W REFLEX MICROSCOPIC
Bilirubin Urine: NEGATIVE
Glucose, UA: NEGATIVE mg/dL
Ketones, ur: NEGATIVE mg/dL
Nitrite: NEGATIVE
Protein, ur: NEGATIVE mg/dL
Specific Gravity, Urine: 1.005 (ref 1.005–1.030)
pH: 7 (ref 5.0–8.0)

## 2023-06-08 MED ORDER — MORPHINE SULFATE (PF) 4 MG/ML IV SOLN
4.0000 mg | Freq: Once | INTRAVENOUS | Status: AC
  Administered 2023-06-08: 4 mg via INTRAVENOUS
  Filled 2023-06-08: qty 1

## 2023-06-08 MED ORDER — CEPHALEXIN 500 MG PO CAPS
500.0000 mg | ORAL_CAPSULE | Freq: Once | ORAL | Status: AC
  Administered 2023-06-08: 500 mg via ORAL
  Filled 2023-06-08: qty 1

## 2023-06-08 MED ORDER — HYDROMORPHONE HCL 1 MG/ML IJ SOLN
1.0000 mg | Freq: Once | INTRAMUSCULAR | Status: AC
  Administered 2023-06-08: 1 mg via INTRAVENOUS
  Filled 2023-06-08: qty 1

## 2023-06-08 MED ORDER — OXYCODONE HCL 5 MG PO TABS
5.0000 mg | ORAL_TABLET | Freq: Three times a day (TID) | ORAL | Status: DC | PRN
Start: 2023-06-08 — End: 2023-06-09
  Administered 2023-06-08: 10 mg via ORAL
  Filled 2023-06-08: qty 2

## 2023-06-08 MED ORDER — CEPHALEXIN 500 MG PO CAPS: 500.0000 mg | ORAL_CAPSULE | Freq: Four times a day (QID) | ORAL | 0 refills | Status: DC

## 2023-06-08 NOTE — ED Triage Notes (Addendum)
Pt arrived by [facility]. Per EMS pt S/O spoke to hospice nurse last night bc pt has new bilateral leg pain and lower back pain & was instructed to send pt to ER. EMS not aware why pt is on hospice.   Pt was given 10mg  of oxycodone & 15mg  ER morphine by SO around 0900 today.

## 2023-06-08 NOTE — ED Provider Notes (Signed)
Patient is in intractable pain despite her home oxycodone as well as the IV Dilaudid she has been given.  Neysa Bonito also called and feels like he cannot take care of her at home anymore.  Given all this, I think she will need admission for pain control and potentially placement.  Discussed with Dr. Thomes Dinning for admission.   Pricilla Loveless, MD 06/08/23 [phone]

## 2023-06-08 NOTE — ED Provider Notes (Signed)
[facility] EMERGENCY DEPARTMENT AT [facility] Provider Note   CSN: [id number] Arrival date & time: 06/08/23  9147     History  Chief Complaint  Patient presents with   Leg Pain    Jessica Bell is a 63 y.o. female.  She has PMH of asthma, COPD, CAD, chronic diastolic heart failure, GERD, hypertension.  [facility] for low back and bilateral leg pain.  Patient states she has had this happen in the past, she is on hospice, her significant other reported to EMS that he gave her 10 mg oxycodone and 50 mg of extended release morphine this morning around 9 AM.  Patient states her pain is controlled now.  Denies injury or trauma, denies change in her pain, denies other complaints.  Per EMS stated other spoke to hospice nurse last night as instructed to bring her to the ER.   Leg Pain      Home Medications Prior to Admission medications   Medication Sig Start Date End Date Taking? Authorizing Provider  cephALEXin (KEFLEX) 500 MG capsule Take 1 capsule (500 mg total) by mouth 4 (four) times daily for 7 days. 06/08/23 06/15/23 Yes Samiel Peel A, PA-C  albuterol (VENTOLIN HFA) 108 (90 Base) MCG/ACT inhaler Inhale 1 puff into the lungs every 4 (four) hours as needed for wheezing or shortness of breath. 10/16/21   Shon Hale, MD  amLODipine (NORVASC) 10 MG tablet Take 1 tablet (10 mg total) by mouth daily. 10/16/21 10/16/22  Shon Hale, MD  atorvastatin (LIPITOR) 80 MG tablet Take 80 mg by mouth daily. 02/07/21   [provider]  carvedilol (COREG) 12.5 MG tablet Take 0.5 tablets (6.25 mg total) by mouth 2 (two) times daily. 10/16/21   Shon Hale, MD  clonazePAM (KLONOPIN) 0.5 MG tablet Take 1 tablet (0.5 mg total) by mouth daily as needed for anxiety. Patient taking differently: Take 0.25 mg by mouth daily as needed for anxiety. 09/28/21   Tommie Sams, DO  cloNIDine (CATAPRES - DOSED IN MG/24 HR) 0.2 mg/24hr patch Place 1 patch (0.2 mg total) onto the skin once a  week. 10/21/21   Shon Hale, MD  clopidogrel (PLAVIX) 75 MG tablet Take 1 tablet (75 mg total) by mouth daily. 10/16/21   Shon Hale, MD  dicyclomine (BENTYL) 10 MG capsule Take 10 mg by mouth 3 (three) times daily as needed for spasms.    [provider]  ferrous gluconate (FERGON) 324 MG tablet Take 1 tablet (324 mg total) by mouth daily with breakfast. 10/16/21   Emokpae, Courage, MD  fluticasone (FLONASE) 50 MCG/ACT nasal spray Place 1 spray into both nostrils daily. 12/25/15   [provider]  Fluticasone-Salmeterol 113-14 MCG/ACT AEPB Inhale 1 puff into the lungs 2 (two) times daily. 10/16/21   Shon Hale, MD  gabapentin (NEURONTIN) 300 MG capsule Take 1 capsule (300 mg total) by mouth 3 (three) times daily. 10/16/21   Shon Hale, MD  hydrOXYzine (ATARAX) 50 MG tablet Take 1 tablet (50 mg total) by mouth 3 (three) times daily as needed for anxiety, itching or nausea. 10/16/21   Shon Hale, MD  levothyroxine (SYNTHROID) 100 MCG tablet Take 1 tablet (100 mcg total) by mouth every morning. 10/16/21   Shon Hale, MD  losartan (COZAAR) 100 MG tablet Take 1 tablet (100 mg total) by mouth daily. 10/16/21   Shon Hale, MD  nicotine (NICODERM CQ - DOSED IN MG/24 HOURS) 21 mg/24hr patch Place 1 patch (21 mg total) onto the  skin daily. 10/17/21   Shon Hale, MD  nitroGLYCERIN (NITROSTAT) 0.4 MG SL tablet Place 0.4 mg under the tongue every 5 (five) minutes as needed for chest pain.    [provider]  omeprazole (PRILOSEC) 20 MG capsule Take 1 capsule (20 mg total) by mouth daily. 10/16/21   Shon Hale, MD  Oxycodone HCl 10 MG TABS Take 0.5-1 tablets (5-10 mg total) by mouth 3 (three) times daily as needed (breakthrough pain). 09/28/21   Tommie Sams, DO  thiamine 100 MG tablet Take 1 tablet (100 mg total) by mouth daily. 10/16/21   Shon Hale, MD      Allergies    Amoxicillin-pot clavulanate and Nsaids    Review of Systems   Review of  Systems  Physical Exam Updated Vital Signs BP (!) 154/67   Pulse 93   Temp 97.7 F (36.5 C) (Oral)   Ht 5\' 4"  (1.626 m)   Wt 72 kg   SpO2 97%   BMI 27.25 kg/m  Physical Exam Vitals and nursing note reviewed.  Constitutional:      General: She is not in acute distress.    Appearance: She is well-developed.  HENT:     Head: Normocephalic and atraumatic.     Mouth/Throat:     Mouth: Mucous membranes are moist.  Eyes:     Extraocular Movements: Extraocular movements intact.     Conjunctiva/sclera: Conjunctivae normal.     Pupils: Pupils are equal, round, and reactive to light.  Cardiovascular:     Rate and Rhythm: Normal rate and regular rhythm.     Heart sounds: No murmur heard. Pulmonary:     Effort: Pulmonary effort is normal. No respiratory distress.     Breath sounds: Normal breath sounds.  Abdominal:     Palpations: Abdomen is soft.     Tenderness: There is no abdominal tenderness.  Musculoskeletal:        General: No swelling.     Cervical back: Neck supple.     Right lower leg: No edema.     Left lower leg: No edema.  Skin:    General: Skin is warm and dry.     Capillary Refill: Capillary refill takes less than 2 seconds.  Neurological:     General: No focal deficit present.     Mental Status: She is alert and oriented to person, place, and time.     Sensory: No sensory deficit.     Comments: Normal sensation to bilateral lower extremities, patient can lift both legs off the bed without assistance  Psychiatric:        Mood and Affect: Mood normal.     ED Results / Procedures / Treatments   Labs (all labs ordered are listed, but only abnormal results are displayed) Labs Reviewed  CBC WITH DIFFERENTIAL/PLATELET - Abnormal; Notable for the following components:      Result Value   WBC 11.5 (*)    Hemoglobin 11.5 (*)    HCT 34.4 (*)    Neutro Abs 9.3 (*)    All other components within normal limits  BASIC METABOLIC PANEL - Abnormal; Notable for the  following components:   Chloride 92 (*)    CO2 34 (*)    Glucose, Bld 105 (*)    BUN 7 (*)    All other components within normal limits  URINALYSIS, ROUTINE W REFLEX MICROSCOPIC - Abnormal; Notable for the following components:   Color, Urine STRAW (*)    Hgb urine  dipstick SMALL (*)    Leukocytes,Ua MODERATE (*)    Bacteria, UA RARE (*)    All other components within normal limits  URINE CULTURE    EKG None  Radiology MR LUMBAR SPINE WO CONTRAST Result Date: 06/08/2023 CLINICAL DATA:  Initial evaluation for acute compression fracture, lower back pain EXAM: MRI LUMBAR SPINE WITHOUT CONTRAST TECHNIQUE: Multiplanar, multisequence MR imaging of the lumbar spine was performed. No intravenous contrast was administered. COMPARISON:  CT from earlier the same day. FINDINGS: Segmentation:  Examination degraded by motion artifact. Standard segmentation. Lowest well-formed disc space labeled the L5-S1 level. Alignment: Mild straightening of the normal lumbar lordosis with trace dextroscoliosis. No significant listhesis. Vertebrae: Acute to subacute compression fracture involving the superior endplate of L4 with mild 20% height loss and trace 2-3 mm bony retropulsion. This is benign/mechanical in appearance. Marrow edema at the adjacent inferior endplate of L3 favored to be reactive/degenerative. No convincing fracture seen at this location on prior CT. No other acute or recent fracture. Mild chronic compression deformities involving the superior endplates of T12 and L2 noted. Bone marrow signal intensity mildly heterogeneous but overall within normal limits. No worrisome osseous lesions. Reactive marrow edema present about the bilateral L3-4 facets due to facet arthritis. Postoperative changes from prior lateral screw fixation across the right SI joint noted. Conus medullaris and cauda equina: Conus extends to the L1-2 level. Conus and cauda equina appear normal. Paraspinal and other soft tissues: Mild  edema noted within the psoas musculature adjacent to the L4 fracture. Paraspinous soft tissues demonstrate no other acute finding. Asymmetric right renal atrophy with a few scattered T2 hyperintense cysts, largest of which measures 1.5 cm. These are benign in appearance, no follow-up imaging recommended. Subcentimeter simple cyst noted with subcapsular posterior right hepatic lobe, also likely benign. Disc levels: T12-L1: Minimal disc bulge. Mild bilateral facet hypertrophy. No stenosis. L1-2: Degenerative intervertebral disc space narrowing with diffuse disc bulge and disc desiccation. Superimposed irregular right subarticular to extraforaminal disc protrusion (series 6, image 10). Superimposed reactive endplate spurring. Mild facet hypertrophy. Prominence of the dorsal epidural fat. Mild spinal stenosis. Foramina remain patent. L2-3: Degenerative intervertebral disc space narrowing with diffuse disc bulge and reactive endplate spurring, asymmetric to the right. Moderate facet hypertrophy. Resultant mild spinal stenosis. Mild right L2 foraminal narrowing. Left neural foramina remains patent. L3-4: Disc desiccation with diffuse disc bulge. Possible superimposed right subarticular disc extrusion with superior migration, best seen on axial T2 weighted sequence (series 6, images 22, 21). This is not well seen on corresponding sequences. Severe bilateral facet arthrosis with reactive marrow edema. Trace 2-3 mm bony retropulsion related to the L4 fracture. Mild epidural lipomatosis. Resultant moderate spinal stenosis. Moderate right with mild left L3 foraminal narrowing. L4-5: Advanced degenerative disc space narrowing with disc desiccation and diffuse disc bulge. Reactive endplate spurring. Moderate facet hypertrophy. Resultant mild bilateral subarticular stenosis. Central canal remains patent. Mild right greater than left L4 foraminal stenosis. L5-S1: Disc desiccation with minimal disc bulge. Small central annular  fissure. Moderate left greater than right facet hypertrophy. No significant spinal stenosis. Mild left L5 foraminal narrowing. Right neural foramen remains patent. IMPRESSION: 1. Acute to subacute compression fracture involving the superior endplate of L4 with mild 20% height loss and trace 2-3 mm bony retropulsion. This is benign/mechanical in appearance. 2. Multifactorial degenerative changes at L3-4 with resultant moderate spinal stenosis, with moderate right and mild left L3 foraminal narrowing. Possible superimposed right subarticular disc extrusion with superior migration at this level  as above, not entirely certain given motion degradation on this exam. 3. Additional multilevel degenerative spondylosis and facet hypertrophy as above. Resultant mild spinal stenosis at L1-2 and L2-3. 4. Reactive marrow edema about the bilateral L3-4 facets due to facet arthritis. Finding could serve as a source for lower back pain. Electronically Signed   By: Rise Mu M.D.   On: 06/08/2023 17:39   CT Lumbar Spine Wo Contrast Result Date: 06/08/2023 CLINICAL DATA:  63 year old female with low back pain, bilateral leg pain. EXAM: CT LUMBAR SPINE WITHOUT CONTRAST TECHNIQUE: Multidetector CT imaging of the lumbar spine was performed without intravenous contrast administration. Multiplanar CT image reconstructions were also generated. RADIATION DOSE REDUCTION: This exam was performed according to the departmental dose-optimization program which includes automated exposure control, adjustment of the mA and/or kV according to patient size and/or use of iterative reconstruction technique. COMPARISON:  CT Abdomen and Pelvis 10/14/2021. FINDINGS: Segmentation: Normal. Alignment: Straightening of lumbar lordosis not significantly changed from 2023. No significant scoliosis. Mild chronic retrolisthesis of L1 on L2. Vertebrae: L4 superior endplate compression fracture appears unhealed and comminuted. 30% loss of vertebral  body height. Prominent new adjacent vacuum disc at L3-L4. Retropulsion of the posterosuperior endplate up to 6 mm. L4 pedicles and posterior elements appear intact, aligned, chronically degenerated. And degenerative appearing bilateral L4-L5 facet ankylosis appears progressed since 2023. Superimposed mildly displaced right L3 transverse process fracture is new since 2023. Other lumbar vertebrae appear stable since 2023 including mild chronic L2 superior endplate deformity. Underlying osteopenia. Superimposed chronic right SI joint arthrodesis hardware. Visible sacrum and SI joints appears stable. Paraspinal and other soft tissues: Aortoiliac calcified atherosclerosis. Chronic right renal atrophy. Noncontrast visible abdominal viscera appear stable. Distended urinary bladder. Diverticulosis of the distal large bowel. Disc levels: Stable CT appearance of chronic lumbar spine degeneration since 2023 except L3-L4: Progressed vacuum disc. Posttraumatic mild retropulsion of the L4 posterosuperior endplate and increased multifactorial moderate to severe spinal stenosis at that level (series 4, image 75 now versus series 3, image 38 there in 2023. Superimposed chronic bulky facet arthropathy. Chronic moderate to severe bilateral L3 foraminal stenosis has not significantly changed. IMPRESSION: 1. Acute to subacute: - L4 compression fracture with 30% loss of vertebral body height, mild retropulsion, and subsequent increased moderate to severe multifactorial spinal stenosis at L3-L4. Chronic L3 foraminal stenosis not significantly changed. - right L3 transverse process fracture. 2. Other chronic lumbar spine degeneration otherwise appears stable by CT since 2023. 3. Distended urinary bladder, query Urinary Retention. 4. Aortic Atherosclerosis (ICD10-I70.0). Large bowel diverticulosis. Chronic right renal atrophy. Electronically Signed   By: Odessa Fleming M.D.   On: 06/08/2023 13:01    Procedures Procedures    Medications  Ordered in ED Medications  cephALEXin (KEFLEX) capsule 500 mg (has no administration in time range)  morphine (PF) 4 MG/ML injection 4 mg (4 mg Intravenous Given 06/08/23 1509)  HYDROmorphone (DILAUDID) injection 1 mg (1 mg Intravenous Given 06/08/23 1558)    ED Course/ Medical Decision Making/ A&P Clinical Course as of 06/08/23 1042  Sat Jun 08, 2023  1040 She is a 63 year old female with history of CHF, COPD, on hospice due to "breathing trouble" per patient presents for bilateral back and leg pain, reports she had PMS at home is feeling better.  Currently awaiting for significant other who called hospice and had patient transported here to come provide more information but patient states she had pain like this before and is feeling better after the medicine she took  today. [CB]    Clinical Course User Index [CB] Ma Rings, PA-C                                 Medical Decision Making This patient presents to the ED for concern of low back and leg pain, difficulty walking after fall several days ago, this involves an extensive number of treatment options, and is a complaint that carries with it a high risk of complications and morbidity.  The differential diagnosis includes x-ray or, traumatic malalignment, HNP, muscle strain, cauda equina, other   Co morbidities that complicate the patient evaluation :   COPD, heart failure, chronic pain   Additional history obtained:  Additional history obtained from EMR External records from outside source obtained and reviewed including prior notes   Lab Tests:  I Ordered, and personally interpreted labs.  The pertinent results include: CBC shows very mild leukocytosis 11.5, BMP shows chloride slightly low at 92, CO2 slightly elevated at 34 likely related to patient's COPD, she is not somnolent, not confused   Imaging Studies ordered:  I ordered imaging studies including CT lumbar spine which shows  acute to subacute L4 compression  fracture with 30% loss of vertebral body height and mild retropulsion and right L3 transverse process fracture I independently visualized and interpreted imaging within scope of identifying emergent findings  I agree with the radiologist interpretation MRI lumbar spine shows no cauda equina mild edema within the psoas musculature adjacent to L4 fracture, L3-L4 diffuse disc bulge and L4 compression fracture with 20% height loss and trace retropulsion     Consultations Obtained:  I requested consultation with the on-call neurosurgeon Dr. Conchita Paris,  and discussed lab and imaging findings as well as pertinent plan - they recommend: LSO brace outpatient follow-up and pain control   Problem List / ED Course / Critical interventions / Medication management  Low back pain-patient had fall, having pain and more difficulty walking for the past couple of days, was mechanical fall with no head injury or loss of consciousness, she does have a compression fracture, she had urinary retention was complaining of severe pain so MRI ordered to rule out possible cauda equina, no cauda equina.    Urinary retention-patient does have some Jessica blood cells in her urine and rare bacteria moderate leukocytes, states she has had this for the past before with UTIs and have a catheter so we will have her follow-up with urology and put her on antibiotics.  She does not have frequent UTIs.   I ordered medication including dilaudid for pain  Reevaluation of the patient after these medicines showed that the patient improved I have reviewed the patients home medicines and have made adjustments as needed   Social Determinants of Health:  Is on hospice    Amount and/or Complexity of Data Reviewed External Data Reviewed: labs and notes.    Details: Also revealed patient's prior PDMP, previous patient is on morphine, oxycodone, gabapentin and alprazolam Labs: ordered. Radiology: ordered.  Risk Prescription drug  management.           Final Clinical Impression(s) / ED Diagnoses Final diagnoses:  Compression fracture of L4 vertebra, initial encounter (HCC)  Urinary retention    Rx / DC Orders ED Discharge Orders          Ordered    cephALEXin (KEFLEX) 500 MG capsule  4 times daily  06/08/23 1910              Ma Rings, PA-C 06/08/23 1917    Coral Spikes, DO 06/09/23 1553
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
## 2023-06-08 NOTE — Progress Notes (Signed)
Orthopedic Tech Progress Note Patient Details:  Jessica Bell Jan 22, 1961 [id number]  Patient ID: [name], female   DOB: 1960-11-25, 63 y.o.   MRN: [id number] I called lso order into [facility] 06/08/2023, 7:24 PM

## 2023-06-08 NOTE — Progress Notes (Signed)
[facility] liaison note      This patient is a current hospice patient with [facility], admitted with a terminal diagnosis of COPD.     We will continue to follow for any discharge planning needs and to coordinate continuation of hospice care.   Please don't hesitate to call with any Hospice related questions or concerns.    Thank you for the opportunity to participate in this patient's care. Thea Gist, Charity fundraiser, BSN [facility] Liaison [phone]

## 2023-06-08 NOTE — ED Notes (Signed)
Pt stated she needed to urinate, pt assisted onto the bedpan and was unable to void, bladder scan done and volume showing 913 ml

## 2023-06-08 NOTE — ED Notes (Signed)
Ortho tech called, let him know pt is being discharged.

## 2023-06-08 NOTE — ED Notes (Signed)
Vendor Brace LSO Brace applied to Pt
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
## 2023-06-08 NOTE — ED Notes (Signed)
Pts daughter who takes care of her would like updates [phone]. She lives in [place], [place].

## 2023-06-08 NOTE — ED Notes (Signed)
Patient is refusing MRI until she gets something else for pain.

## 2023-06-08 NOTE — ED Notes (Addendum)
This RN called pts fiance Jessica Bell to let him know that the pt was ready to be picked up and he endorses that he is not able to care for her anymore. Jessica Bell endorsed that he does not feel like he is safe caring for her and does not feel like he can safely care for her. This RN made MD Criss Alvine aware and he endorsed he will work on placement for her.

## 2023-06-08 NOTE — Discharge Instructions (Addendum)
You were seen in the ER for back pain today after a fall several days ago.  You have a compression fracture of your L4 vertebrae, I spoke with the on-call neurosurgeon who recommended LSO brace and follow-up.  He is have a bulging disc.  MRI also showed other chronic changes.  You can either follow-up with the neurosurgeon you saw at [facility] in the past or with neurosurgery as referred today.  Having difficulty urinating today and had to have a catheter.  As discussed you have had this happen before.  Please follow-up with urology to see if this can be removed.  You are treating you for UTI, your urine was sent for culture.  Continue your pain medicines per hospice and come back to the ER if you have new or worsening symptoms.

## 2023-06-09 DIAGNOSIS — S32040K Wedge compression fracture of fourth lumbar vertebra, subsequent encounter for fracture with nonunion: Secondary | ICD-10-CM | POA: Diagnosis not present

## 2023-06-09 DIAGNOSIS — R52 Pain, unspecified: Secondary | ICD-10-CM

## 2023-06-09 DIAGNOSIS — W19XXXA Unspecified fall, initial encounter: Secondary | ICD-10-CM

## 2023-06-09 DIAGNOSIS — I5030 Unspecified diastolic (congestive) heart failure: Secondary | ICD-10-CM | POA: Insufficient documentation

## 2023-06-09 DIAGNOSIS — I10 Essential (primary) hypertension: Secondary | ICD-10-CM

## 2023-06-09 DIAGNOSIS — R338 Other retention of urine: Secondary | ICD-10-CM | POA: Insufficient documentation

## 2023-06-09 LAB — CBC
HCT: 35.9 % — ABNORMAL LOW (ref 36.0–46.0)
Hemoglobin: 11.5 g/dL — ABNORMAL LOW (ref 12.0–15.0)
MCH: 27.1 pg (ref 26.0–34.0)
MCHC: 32 g/dL (ref 30.0–36.0)
MCV: 84.5 fL (ref 80.0–100.0)
Platelets: 303 10*3/uL (ref 150–400)
RBC: 4.25 MIL/uL (ref 3.87–5.11)
RDW: 13.6 % (ref 11.5–15.5)
WBC: 12.6 10*3/uL — ABNORMAL HIGH (ref 4.0–10.5)
nRBC: 0 % (ref 0.0–0.2)

## 2023-06-09 LAB — COMPREHENSIVE METABOLIC PANEL
ALT: 18 U/L (ref 0–44)
AST: 16 U/L (ref 15–41)
Albumin: 3.3 g/dL — ABNORMAL LOW (ref 3.5–5.0)
Alkaline Phosphatase: 96 U/L (ref 38–126)
Anion gap: 8 (ref 5–15)
BUN: 5 mg/dL — ABNORMAL LOW (ref 8–23)
CO2: 34 mmol/L — ABNORMAL HIGH (ref 22–32)
Calcium: 9.1 mg/dL (ref 8.9–10.3)
Chloride: 93 mmol/L — ABNORMAL LOW (ref 98–111)
Creatinine, Ser: 0.48 mg/dL (ref 0.44–1.00)
GFR, Estimated: >60 mL/min (ref >60)
Glucose, Bld: 101 mg/dL — ABNORMAL HIGH (ref 70–99)
Potassium: 3.6 mmol/L (ref 3.5–5.1)
Sodium: 135 mmol/L (ref 135–145)
Total Bilirubin: 0.8 mg/dL (ref 0.0–1.2)
Total Protein: 6.2 g/dL — ABNORMAL LOW (ref 6.5–8.1)

## 2023-06-09 LAB — PHOSPHORUS: Phosphorus: 2.7 mg/dL (ref 2.5–4.6)

## 2023-06-09 LAB — MAGNESIUM: Magnesium: 1.6 mg/dL — ABNORMAL LOW (ref 1.7–2.4)

## 2023-06-09 LAB — HIV ANTIBODY (ROUTINE TESTING W REFLEX): HIV Screen 4th Generation wRfx: NONREACTIVE

## 2023-06-09 MED ORDER — CARVEDILOL 3.125 MG PO TABS
6.2500 mg | ORAL_TABLET | Freq: Two times a day (BID) | ORAL | Status: DC
  Administered 2023-06-09 – 2023-06-10 (×3): 6.25 mg via ORAL
  Filled 2023-06-09 (×3): qty 2

## 2023-06-09 MED ORDER — GABAPENTIN 300 MG PO CAPS
300.0000 mg | ORAL_CAPSULE | Freq: Three times a day (TID) | ORAL | Status: DC
  Administered 2023-06-09 – 2023-06-10 (×3): 300 mg via ORAL
  Filled 2023-06-09 (×3): qty 1

## 2023-06-09 MED ORDER — PANTOPRAZOLE SODIUM 40 MG PO TBEC
40.0000 mg | DELAYED_RELEASE_TABLET | Freq: Every day | ORAL | Status: DC
Start: 2023-06-09 — End: 2023-06-12
  Administered 2023-06-09 – 2023-06-11 (×3): 40 mg via ORAL
  Filled 2023-06-09 (×3): qty 1

## 2023-06-09 MED ORDER — ACETAMINOPHEN 500 MG PO TABS
1000.0000 mg | ORAL_TABLET | Freq: Three times a day (TID) | ORAL | Status: DC
  Administered 2023-06-09 – 2023-06-16 (×16): 1000 mg via ORAL
  Filled 2023-06-09 (×18): qty 2

## 2023-06-09 MED ORDER — IPRATROPIUM-ALBUTEROL 0.5-2.5 (3) MG/3ML IN SOLN
3.0000 mL | Freq: Two times a day (BID) | RESPIRATORY_TRACT | Status: DC
  Administered 2023-06-10 – 2023-06-16 (×13): 3 mL via RESPIRATORY_TRACT
  Filled 2023-06-09 (×13): qty 3

## 2023-06-09 MED ORDER — ENOXAPARIN SODIUM 40 MG/0.4ML IJ SOSY
40.0000 mg | PREFILLED_SYRINGE | INTRAMUSCULAR | Status: DC
  Administered 2023-06-09 – 2023-06-11 (×3): 40 mg via SUBCUTANEOUS
  Filled 2023-06-09 (×3): qty 0.4

## 2023-06-09 MED ORDER — LEVOTHYROXINE SODIUM 100 MCG PO TABS
100.0000 ug | ORAL_TABLET | Freq: Every day | ORAL | Status: DC
  Administered 2023-06-09 – 2023-06-16 (×7): 100 ug via ORAL
  Filled 2023-06-09 (×9): qty 1

## 2023-06-09 MED ORDER — AMLODIPINE BESYLATE 5 MG PO TABS
10.0000 mg | ORAL_TABLET | Freq: Every day | ORAL | Status: DC
  Administered 2023-06-09 – 2023-06-11 (×3): 10 mg via ORAL
  Filled 2023-06-09 (×3): qty 2

## 2023-06-09 MED ORDER — ACETAMINOPHEN 325 MG PO TABS
650.0000 mg | ORAL_TABLET | Freq: Four times a day (QID) | ORAL | Status: DC | PRN
Start: 2023-06-09 — End: 2023-06-16
  Administered 2023-06-16: 650 mg via ORAL
  Filled 2023-06-09 (×2): qty 2

## 2023-06-09 MED ORDER — HYDROMORPHONE HCL 1 MG/ML IJ SOLN
0.5000 mg | INTRAMUSCULAR | Status: DC | PRN
  Administered 2023-06-09 – 2023-06-10 (×2): 0.5 mg via INTRAVENOUS
  Filled 2023-06-09 (×2): qty 1

## 2023-06-09 MED ORDER — LOSARTAN POTASSIUM 50 MG PO TABS: 100.0000 mg | ORAL_TABLET | Freq: Every day | ORAL | Status: DC

## 2023-06-09 MED ORDER — NICOTINE 21 MG/24HR TD PT24
21.0000 mg | MEDICATED_PATCH | Freq: Every day | TRANSDERMAL | Status: DC
  Administered 2023-06-09 – 2023-06-16 (×8): 21 mg via TRANSDERMAL
  Filled 2023-06-09 (×8): qty 1

## 2023-06-09 MED ORDER — OXYCODONE HCL 5 MG PO TABS: 5.0000 mg | ORAL_TABLET | Freq: Three times a day (TID) | ORAL | Status: DC | PRN

## 2023-06-09 MED ORDER — ONDANSETRON HCL 4 MG/2ML IJ SOLN
4.0000 mg | Freq: Four times a day (QID) | INTRAMUSCULAR | Status: DC | PRN
  Administered 2023-06-10: 4 mg via INTRAVENOUS
  Filled 2023-06-09: qty 2

## 2023-06-09 MED ORDER — ALBUTEROL SULFATE (2.5 MG/3ML) 0.083% IN NEBU: 3.0000 mL | INHALATION_SOLUTION | RESPIRATORY_TRACT | Status: DC | PRN

## 2023-06-09 MED ORDER — CLOPIDOGREL BISULFATE 75 MG PO TABS
75.0000 mg | ORAL_TABLET | Freq: Every day | ORAL | Status: DC
  Administered 2023-06-09 – 2023-06-16 (×7): 75 mg via ORAL
  Filled 2023-06-09 (×8): qty 1

## 2023-06-09 MED ORDER — ACETAMINOPHEN 650 MG RE SUPP: 650.0000 mg | Freq: Four times a day (QID) | RECTAL | Status: DC | PRN

## 2023-06-09 MED ORDER — ATORVASTATIN CALCIUM 40 MG PO TABS
80.0000 mg | ORAL_TABLET | Freq: Every day | ORAL | Status: DC
  Administered 2023-06-09 – 2023-06-16 (×6): 80 mg via ORAL
  Filled 2023-06-09 (×8): qty 2

## 2023-06-09 MED ORDER — CHLORHEXIDINE GLUCONATE CLOTH 2 % EX PADS
6.0000 | MEDICATED_PAD | Freq: Every day | CUTANEOUS | Status: DC
  Administered 2023-06-09 – 2023-06-16 (×7): 6 via TOPICAL

## 2023-06-09 MED ORDER — ONDANSETRON HCL 4 MG PO TABS: 4.0000 mg | ORAL_TABLET | Freq: Four times a day (QID) | ORAL | Status: DC | PRN

## 2023-06-09 MED ORDER — LOSARTAN POTASSIUM 50 MG PO TABS
50.0000 mg | ORAL_TABLET | Freq: Every day | ORAL | Status: DC
  Administered 2023-06-10 – 2023-06-13 (×3): 50 mg via ORAL
  Filled 2023-06-09 (×5): qty 1

## 2023-06-09 MED ORDER — IPRATROPIUM-ALBUTEROL 0.5-2.5 (3) MG/3ML IN SOLN
3.0000 mL | Freq: Three times a day (TID) | RESPIRATORY_TRACT | Status: DC
  Administered 2023-06-09: 3 mL via RESPIRATORY_TRACT
  Filled 2023-06-09 (×2): qty 3

## 2023-06-09 NOTE — Progress Notes (Signed)
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 She was transported to ED by EMS, with complaints of unmanaged pain following a fall. [facility] was notified of this transfer. She was admitted 06/08/23 with a diagnosis of Compression Fracture. Per Dr. Gordy Savers with [facility], this is a related hospital admission.   Visited with patient at bedside. She is resting with eyes closed. Not outward signs of distress. Answering questions with brief 1-2 word answers. She does reports that at discharge she would like to return home. Report exchanged with bedside nurse and medication list provided.   Patient is inpatient appropriate with acute treatment for recent fall with compression fracture.   Vital Signs: 98.5/88/20   176/75    94% on room air  I&O: not recorded/600 Abnormal labs: BUN 5, Mg+ 1.6, Albumin 3.3, WBC 12.6, Hgb 11.5, HCT 35.9 Imaging: MRI LUMBAR SPINE WITHOUT CONTRAST  IMPRESSION: 1. Acute to subacute compression fracture involving the superior endplate of L4 with mild 20% height loss and trace 2-3 mm bony retropulsion. This is benign/mechanical in appearance. 2. Multifactorial degenerative changes at L3-4 with resultant moderate spinal stenosis, with moderate right and mild left L3 foraminal narrowing. Possible superimposed right subarticular disc extrusion with superior migration at this level as above, not entirely certain given motion degradation on this exam. 3. Additional multilevel degenerative spondylosis and facet hypertrophy as above. Resultant mild spinal stenosis at L1-2 and L2-3. 4. Reactive marrow edema about the bilateral L3-4 facets due to facet arthritis. Finding could serve as a source for lower back pain.  Electronically Signed   By: Rise Mu M.D.   On: 06/08/2023 17:39   IV/PRN Meds: IV Dilaudid 0.5mg  x1, IV Dilaudid 1mg  x2, IV Morphine 4mg   x1, Oxycodone 10mg  x1,   Problem List as per Progress Note Dr. Onalee Hua Tat 06/09/2023 Intractable back pain/increased debility/physical deconditioning Compression fracture L4 -TLSO brace -Judicious opioids--continue IV Dilaudid every 3 hours as needed for now -PT evaluation Urinary retention -Foley catheter inserted 06/08/2023 Pyuria -Follow urine cultures  Discharge Planning:  Ongoing, pt states she will return home at d/c  Family Contact: none, pt is her own spokesperson  IDT: updated  Goals of Care: DNR  Thea Gist, BSN RN Hospice hospital liaison [phone]

## 2023-06-09 NOTE — Plan of Care (Signed)
Problem: Coping: Goal: Level of anxiety will decrease Outcome: Progressing

## 2023-06-09 NOTE — Progress Notes (Signed)
Pt refused to eat breakfast.

## 2023-06-09 NOTE — Progress Notes (Signed)
Patient lying on right side on RA. SPO2 was 92% at this time. I assessed patient and spoke with her about her nebulizer treatment that was scheduled. He refused saying "I want to sleep". I told her to let someone know if she changed her mind. Patient cleared congestion with cough.

## 2023-06-09 NOTE — Progress Notes (Signed)
Pt had episode of gagging and coughing while taking night time meds. Cough is strong. SPO2 96% on RA sitting up in bed. Kellogg RN

## 2023-06-09 NOTE — Progress Notes (Signed)
PROGRESS NOTE  Jessica Bell:[id number] DOB: 1961-04-08 DOA: 06/08/2023 PCP: Pcp, No  Brief History:  63 year old female with a history of COPD, chronic respiratory failure on 5 L, tobacco abuse, HFpEF, hypertension, PRES, hyperlipidemia, opioid dependence, coronary disease, GERD, hypothyroidism presenting with worsening back pain radiating to bilateral legs.  The patient sustained a mechanical fall about 2 weeks prior to admission.  Since that period time, the patient has had increasing pain.  At baseline, the patient has been ambulating with a walker.  However because of the pain she is having more difficulty.  She is only be able to take a few steps.  Her significant other is having difficulty caring for her.  As result, she was brought to the emergency department for further evaluation and treatment.  The patient denies any fevers, chills, headache, chest pain, shortness breath, hemoptysis, nausea, vomiting, diarrhea, abdominal pain. Notably, the patient administers her own medications.  She states that she has not been 100% compliant. In the ED, the patient was afebrile hemodynamically stable with oxygen saturation 93 to 96% on 5 L.  WBC 11.5, hemoglobin 11.5, platelets 203.  Sodium 135, potassium 3.6, bicarbonate 34, serum creatinine 0.52.  LFTs were unremarkable.  MRI of the lumbar spine showed acute to subacute compression fracture with 20% loss in height.  There was 2 to 3 mm bony retropulsion.  It appeared benign and mechanical in appearance.  There is multifactorial L3-L4 moderate spinal stenosis with moderate right and mild left L3 foraminal stenosis.  There is reactive marrow edema bilateral L3-4 facets secondary to facet arthritis.  There is mild spinal stenosis L1-2, L2-3. CT lumbar spine had similar findings.  Neurosurgery, Dr. Conchita Paris was consulted and recommended continue medical therapy, pain control, and outpatient follow-up. The patient significant other stated that he  could no longer take care of the patient because of the patient's progressive debility.  As result the patient was admitted for further evaluation and treatment.   Assessment/Plan: Intractable back pain/increased debility/physical deconditioning Compression fracture L4 -TLSO brace -Judicious opioids--continue IV Dilaudid every 3 hours as needed for now -PT evaluation  Urinary retention -Foley catheter inserted 06/08/2023  Pyuria -Follow urine cultures  PRES history Optimize BP control Poor compliance with meds at home--pt endorses this   Tobacco abuse Tobacco cessation discussed Nicoderm patch   Opioid dependence (HCC) PDMP reviewed Oxycodone 10 mg, #90--refilled 06/07/23 Morphine ER 15 mg, #30--filled 06/04/23 Morphine concentrate 100mg /5cc--30 cc--filled 05/31/23, 05/20/23 Xanax 1 mg, #45, filled 06/12/23, 05/19/33   Chronic HFpEF Clinically euvolemic 05/11/2021 echo EF 50-55%, G1DD Distal septal HK Restart carvedilol   COPD (chronic obstructive pulmonary disease) (HCC) Stable on room air As needed bronchodilators   Essential hypertension Restart carvedilol   Dyslipidemia Continue statin  CAD S/P percutaneous coronary angioplasty No chest pain presently Continue Plavix and statin  Hypothyroidism Continue synthroid  Obesity BMI 31.07 Lifestyle modification           Family Communication:   significant other updated  Consultants:  none  Code Status:  DNR  DVT Prophylaxis:  [place] Lovenox   Procedures: As Listed in Progress Note Above  Antibiotics: None       Subjective: Patient denies fevers, chills, headache, chest pain, dyspnea, nausea, vomiting, diarrhea, abdominal pain, dysuria, hematuria, hematochezia, and melena.   Objective: Vitals:   06/08/23 2145 06/08/23 2237 06/09/23 0212 06/09/23 0706  BP: (!) 179/77 (!) 159/83 (!) 169/90 (!) 175/95  Pulse: 77 78  87 86  Resp: 18 20 20 20   Temp: 97.9 F (36.6 C) (!) 97.5 F (36.4 C) (!)  97.5 F (36.4 C) 98.2 F (36.8 C)  TempSrc: Oral Oral Oral Oral  SpO2: 96% 99% 93% 100%  Weight:  82.1 kg    Height:  5\' 4"  (1.626 m)      Intake/Output Summary (Last 24 hours) at 06/09/2023 0838 Last data filed at 06/09/2023 0620 Gross per 24 hour  Intake 0 ml  Output 600 ml  Net -600 ml   Weight change:  Exam:  General:  Pt is alert, follows commands appropriately, not in acute distress HEENT: No icterus, No thrush, No neck mass, [place]/AT Cardiovascular: RRR, S1/S2, no rubs, no gallops Respiratory: diminished BS.  No wheeze Abdomen: Soft/+BS, non tender, non distended, no guarding Extremities: No edema, No lymphangitis, No petechiae, No rashes, no synovitis Neuro:  CN II-XII intact, strength 4/5 in RUE, RLE, strength 4/5 LUE, LLE; sensation intact bilateral; no dysmetria; babinski equivocal   Data Reviewed: I have personally reviewed following labs and imaging studies Basic Metabolic Panel: Recent Labs  Lab 06/08/23 1223 06/09/23 0513  NA 135 135  K 4.1 3.6  CL 92* 93*  CO2 34* 34*  GLUCOSE 105* 101*  BUN 7* 5*  CREATININE 0.52 0.48  CALCIUM 8.9 9.1  MG  --  1.6*  PHOS  --  2.7   Liver Function Tests: Recent Labs  Lab 06/09/23 0513  AST 16  ALT 18  ALKPHOS 96  BILITOT 0.8  PROT 6.2*  ALBUMIN 3.3*   No results for input(s): "LIPASE", "AMYLASE" in the last 168 hours. No results for input(s): "AMMONIA" in the last 168 hours. Coagulation Profile: No results for input(s): "INR", "PROTIME" in the last 168 hours. CBC: Recent Labs  Lab 06/08/23 1223 06/09/23 0513  WBC 11.5* 12.6*  NEUTROABS 9.3*  --   HGB 11.5* 11.5*  HCT 34.4* 35.9*  MCV 86.9 84.5  PLT 254 303   Cardiac Enzymes: No results for input(s): "CKTOTAL", "CKMB", "CKMBINDEX", "TROPONINI" in the last 168 hours. BNP: Invalid input(s): "POCBNP" CBG: No results for input(s): "GLUCAP" in the last 168 hours. HbA1C: No results for input(s): "HGBA1C" in the last 72 hours. Urine analysis:     Component Value Date/Time   COLORURINE STRAW (A) 06/08/2023 1444   APPEARANCEUR CLEAR 06/08/2023 1444   LABSPEC 1.005 06/08/2023 1444   PHURINE 7.0 06/08/2023 1444   GLUCOSEU NEGATIVE 06/08/2023 1444   HGBUR SMALL (A) 06/08/2023 1444   BILIRUBINUR NEGATIVE 06/08/2023 1444   KETONESUR NEGATIVE 06/08/2023 1444   PROTEINUR NEGATIVE 06/08/2023 1444   NITRITE NEGATIVE 06/08/2023 1444   LEUKOCYTESUR MODERATE (A) 06/08/2023 1444   Sepsis Labs: @LABRCNTIP (procalcitonin:4,lacticidven:4) )No results found for this or any previous visit (from the past 240 hours).   Scheduled Meds:  amLODipine  10 mg Oral Daily   atorvastatin  80 mg Oral Daily   carvedilol  6.25 mg Oral BID   clopidogrel  75 mg Oral Daily   enoxaparin (LOVENOX) injection  40 mg Subcutaneous Q24H   levothyroxine  100 mcg Oral Q0600   losartan  100 mg Oral Daily   nicotine  21 mg Transdermal Daily   pantoprazole  40 mg Oral Daily   Continuous Infusions:  Procedures/Studies: MR LUMBAR SPINE WO CONTRAST Result Date: 06/08/2023 CLINICAL DATA:  Initial evaluation for acute compression fracture, lower back pain EXAM: MRI LUMBAR SPINE WITHOUT CONTRAST TECHNIQUE: Multiplanar, multisequence MR imaging of the lumbar spine was performed. No  intravenous contrast was administered. COMPARISON:  CT from earlier the same day. FINDINGS: Segmentation:  Examination degraded by motion artifact. Standard segmentation. Lowest well-formed disc space labeled the L5-S1 level. Alignment: Mild straightening of the normal lumbar lordosis with trace dextroscoliosis. No significant listhesis. Vertebrae: Acute to subacute compression fracture involving the superior endplate of L4 with mild 20% height loss and trace 2-3 mm bony retropulsion. This is benign/mechanical in appearance. Marrow edema at the adjacent inferior endplate of L3 favored to be reactive/degenerative. No convincing fracture seen at this location on prior CT. No other acute or recent  fracture. Mild chronic compression deformities involving the superior endplates of T12 and L2 noted. Bone marrow signal intensity mildly heterogeneous but overall within normal limits. No worrisome osseous lesions. Reactive marrow edema present about the bilateral L3-4 facets due to facet arthritis. Postoperative changes from prior lateral screw fixation across the right SI joint noted. Conus medullaris and cauda equina: Conus extends to the L1-2 level. Conus and cauda equina appear normal. Paraspinal and other soft tissues: Mild edema noted within the psoas musculature adjacent to the L4 fracture. Paraspinous soft tissues demonstrate no other acute finding. Asymmetric right renal atrophy with a few scattered T2 hyperintense cysts, largest of which measures 1.5 cm. These are benign in appearance, no follow-up imaging recommended. Subcentimeter simple cyst noted with subcapsular posterior right hepatic lobe, also likely benign. Disc levels: T12-L1: Minimal disc bulge. Mild bilateral facet hypertrophy. No stenosis. L1-2: Degenerative intervertebral disc space narrowing with diffuse disc bulge and disc desiccation. Superimposed irregular right subarticular to extraforaminal disc protrusion (series 6, image 10). Superimposed reactive endplate spurring. Mild facet hypertrophy. Prominence of the dorsal epidural fat. Mild spinal stenosis. Foramina remain patent. L2-3: Degenerative intervertebral disc space narrowing with diffuse disc bulge and reactive endplate spurring, asymmetric to the right. Moderate facet hypertrophy. Resultant mild spinal stenosis. Mild right L2 foraminal narrowing. Left neural foramina remains patent. L3-4: Disc desiccation with diffuse disc bulge. Possible superimposed right subarticular disc extrusion with superior migration, best seen on axial T2 weighted sequence (series 6, images 22, 21). This is not well seen on corresponding sequences. Severe bilateral facet arthrosis with reactive marrow  edema. Trace 2-3 mm bony retropulsion related to the L4 fracture. Mild epidural lipomatosis. Resultant moderate spinal stenosis. Moderate right with mild left L3 foraminal narrowing. L4-5: Advanced degenerative disc space narrowing with disc desiccation and diffuse disc bulge. Reactive endplate spurring. Moderate facet hypertrophy. Resultant mild bilateral subarticular stenosis. Central canal remains patent. Mild right greater than left L4 foraminal stenosis. L5-S1: Disc desiccation with minimal disc bulge. Small central annular fissure. Moderate left greater than right facet hypertrophy. No significant spinal stenosis. Mild left L5 foraminal narrowing. Right neural foramen remains patent. IMPRESSION: 1. Acute to subacute compression fracture involving the superior endplate of L4 with mild 20% height loss and trace 2-3 mm bony retropulsion. This is benign/mechanical in appearance. 2. Multifactorial degenerative changes at L3-4 with resultant moderate spinal stenosis, with moderate right and mild left L3 foraminal narrowing. Possible superimposed right subarticular disc extrusion with superior migration at this level as above, not entirely certain given motion degradation on this exam. 3. Additional multilevel degenerative spondylosis and facet hypertrophy as above. Resultant mild spinal stenosis at L1-2 and L2-3. 4. Reactive marrow edema about the bilateral L3-4 facets due to facet arthritis. Finding could serve as a source for lower back pain. Electronically Signed   By: Rise Mu M.D.   On: 06/08/2023 17:39   CT Lumbar Spine Wo Contrast  Result Date: 06/08/2023 CLINICAL DATA:  63 year old female with low back pain, bilateral leg pain. EXAM: CT LUMBAR SPINE WITHOUT CONTRAST TECHNIQUE: Multidetector CT imaging of the lumbar spine was performed without intravenous contrast administration. Multiplanar CT image reconstructions were also generated. RADIATION DOSE REDUCTION: This exam was performed according  to the departmental dose-optimization program which includes automated exposure control, adjustment of the mA and/or kV according to patient size and/or use of iterative reconstruction technique. COMPARISON:  CT Abdomen and Pelvis 10/14/2021. FINDINGS: Segmentation: Normal. Alignment: Straightening of lumbar lordosis not significantly changed from 2023. No significant scoliosis. Mild chronic retrolisthesis of L1 on L2. Vertebrae: L4 superior endplate compression fracture appears unhealed and comminuted. 30% loss of vertebral body height. Prominent new adjacent vacuum disc at L3-L4. Retropulsion of the posterosuperior endplate up to 6 mm. L4 pedicles and posterior elements appear intact, aligned, chronically degenerated. And degenerative appearing bilateral L4-L5 facet ankylosis appears progressed since 2023. Superimposed mildly displaced right L3 transverse process fracture is new since 2023. Other lumbar vertebrae appear stable since 2023 including mild chronic L2 superior endplate deformity. Underlying osteopenia. Superimposed chronic right SI joint arthrodesis hardware. Visible sacrum and SI joints appears stable. Paraspinal and other soft tissues: Aortoiliac calcified atherosclerosis. Chronic right renal atrophy. Noncontrast visible abdominal viscera appear stable. Distended urinary bladder. Diverticulosis of the distal large bowel. Disc levels: Stable CT appearance of chronic lumbar spine degeneration since 2023 except L3-L4: Progressed vacuum disc. Posttraumatic mild retropulsion of the L4 posterosuperior endplate and increased multifactorial moderate to severe spinal stenosis at that level (series 4, image 75 now versus series 3, image 38 there in 2023. Superimposed chronic bulky facet arthropathy. Chronic moderate to severe bilateral L3 foraminal stenosis has not significantly changed. IMPRESSION: 1. Acute to subacute: - L4 compression fracture with 30% loss of vertebral body height, mild retropulsion, and  subsequent increased moderate to severe multifactorial spinal stenosis at L3-L4. Chronic L3 foraminal stenosis not significantly changed. - right L3 transverse process fracture. 2. Other chronic lumbar spine degeneration otherwise appears stable by CT since 2023. 3. Distended urinary bladder, query Urinary Retention. 4. Aortic Atherosclerosis (ICD10-I70.0). Large bowel diverticulosis. Chronic right renal atrophy. Electronically Signed   By: Odessa Fleming M.D.   On: 06/08/2023 13:01    Catarina Hartshorn, DO  [facility]  If 7PM-7AM, please contact night-coverage [email or web address] Password [facility] 06/09/2023, 8:38 AM   LOS: 1 day

## 2023-06-09 NOTE — Progress Notes (Signed)
Pt refused to eat lunch, stated "I am not hungry, leave me alone."

## 2023-06-09 NOTE — Progress Notes (Signed)
   06/09/23 1308  TOC Brief Assessment  Insurance and Status Reviewed  Patient has primary care physician Yes  Home environment has been reviewed From Home  Prior level of function: Needs Assistance/currently Hospice Care [facility]  Prior/Current Home Services Current home services [facility])  Social Drivers of Health Review SDOH reviewed no interventions necessary  Readmission risk has been reviewed Yes  Transition of care needs transition of care needs identified, TOC will continue to follow    Pt is active with [facility]. May need to have medical care for fracture/PT to evaluate. DC Monday-could be recommended for SNF Rehab.

## 2023-06-09 NOTE — Hospital Course (Signed)
63 year old female with a history of COPD, chronic respiratory failure on 5 L, tobacco abuse, HFpEF, hypertension, PRES, hyperlipidemia, opioid dependence, coronary disease, GERD, hypothyroidism presenting with worsening back pain radiating to bilateral legs.  The patient sustained a mechanical fall about 2 weeks prior to admission.  Since that period time, the patient has had increasing pain.  At baseline, the patient has been ambulating with a walker.  However because of the pain she is having more difficulty.  She is only be able to take a few steps.  Her significant other is having difficulty caring for her.  As result, she was brought to the emergency department for further evaluation and treatment.  The patient denies any fevers, chills, headache, chest pain, shortness breath, hemoptysis, nausea, vomiting, diarrhea, abdominal pain. Notably, the patient administers her own medications.  She states that she has not been 100% compliant. In the ED, the patient was afebrile hemodynamically stable with oxygen saturation 93 to 96% on 5 L.  WBC 11.5, hemoglobin 11.5, platelets 203.  Sodium 135, potassium 3.6, bicarbonate 34, serum creatinine 0.52.  LFTs were unremarkable.  MRI of the lumbar spine showed acute to subacute compression fracture with 20% loss in height.  There was 2 to 3 mm bony retropulsion.  It appeared benign and mechanical in appearance.  There is multifactorial L3-L4 moderate spinal stenosis with moderate right and mild left L3 foraminal stenosis.  There is reactive marrow edema bilateral L3-4 facets secondary to facet arthritis.  There is mild spinal stenosis L1-2, L2-3. CT lumbar spine had similar findings.  Neurosurgery, Dr. Conchita Paris was consulted and recommended continue medical therapy, pain control, and outpatient follow-up. The patient significant other stated that he could no longer take care of the patient because of the patient's progressive debility.  As result the patient was  admitted for further evaluation and treatment.

## 2023-06-09 NOTE — Plan of Care (Signed)
Problem: Pain Managment: Goal: General experience of comfort will improve and/or be controlled Outcome: Progressing   Problem: Skin Integrity: Goal: Risk for impaired skin integrity will decrease Outcome: Progressing

## 2023-06-09 NOTE — Plan of Care (Signed)

## 2023-06-10 ENCOUNTER — Inpatient Hospital Stay (HOSPITAL_COMMUNITY): Payer: Medicare Other

## 2023-06-10 DIAGNOSIS — I5032 Chronic diastolic (congestive) heart failure: Secondary | ICD-10-CM | POA: Diagnosis not present

## 2023-06-10 DIAGNOSIS — G9341 Metabolic encephalopathy: Secondary | ICD-10-CM

## 2023-06-10 DIAGNOSIS — S32040K Wedge compression fracture of fourth lumbar vertebra, subsequent encounter for fracture with nonunion: Secondary | ICD-10-CM | POA: Diagnosis not present

## 2023-06-10 DIAGNOSIS — S32040A Wedge compression fracture of fourth lumbar vertebra, initial encounter for closed fracture: Secondary | ICD-10-CM

## 2023-06-10 DIAGNOSIS — R339 Retention of urine, unspecified: Secondary | ICD-10-CM

## 2023-06-10 DIAGNOSIS — J690 Pneumonitis due to inhalation of food and vomit: Secondary | ICD-10-CM

## 2023-06-10 LAB — T4, FREE: Free T4: 1.36 ng/dL — ABNORMAL HIGH (ref 0.61–1.12)

## 2023-06-10 LAB — BASIC METABOLIC PANEL
Anion gap: 13 (ref 5–15)
BUN: 7 mg/dL — ABNORMAL LOW (ref 8–23)
CO2: 31 mmol/L (ref 22–32)
Calcium: 8.9 mg/dL (ref 8.9–10.3)
Chloride: 87 mmol/L — ABNORMAL LOW (ref 98–111)
Creatinine, Ser: 0.7 mg/dL (ref 0.44–1.00)
GFR, Estimated: >60 mL/min (ref >60)
Glucose, Bld: 127 mg/dL — ABNORMAL HIGH (ref 70–99)
Potassium: 2.6 mmol/L — CL (ref 3.5–5.1)
Sodium: 131 mmol/L — ABNORMAL LOW (ref 135–145)

## 2023-06-10 LAB — CBC
HCT: 35.3 % — ABNORMAL LOW (ref 36.0–46.0)
Hemoglobin: 12.2 g/dL (ref 12.0–15.0)
MCH: 27.8 pg (ref 26.0–34.0)
MCHC: 34.6 g/dL (ref 30.0–36.0)
MCV: 80.4 fL (ref 80.0–100.0)
Platelets: 400 10*3/uL (ref 150–400)
RBC: 4.39 MIL/uL (ref 3.87–5.11)
RDW: 14.1 % (ref 11.5–15.5)
WBC: 18.8 10*3/uL — ABNORMAL HIGH (ref 4.0–10.5)
nRBC: 0 % (ref 0.0–0.2)

## 2023-06-10 LAB — URINE CULTURE: Culture: NO GROWTH

## 2023-06-10 LAB — BLOOD GAS, VENOUS
Acid-Base Excess: 17.2 mmol/L — ABNORMAL HIGH (ref 0.0–2.0)
Bicarbonate: 40.1 mmol/L — ABNORMAL HIGH (ref 20.0–28.0)
Drawn by: 8368
O2 Saturation: 93.4 %
Patient temperature: 36.6
pCO2, Ven: 38 mm[Hg] — ABNORMAL LOW (ref 44–60)
pH, Ven: 7.63 (ref 7.25–7.43)
pO2, Ven: 58 mm[Hg] — ABNORMAL HIGH (ref 32–45)

## 2023-06-10 LAB — TSH: TSH: 0.553 u[IU]/mL (ref 0.350–4.500)

## 2023-06-10 LAB — FOLATE: Folate: 21.1 ng/mL (ref >5.9)

## 2023-06-10 LAB — MAGNESIUM: Magnesium: 2.2 mg/dL (ref 1.7–2.4)

## 2023-06-10 LAB — LACTIC ACID, PLASMA: Lactic Acid, Venous: 1.2 mmol/L (ref 0.5–1.9)

## 2023-06-10 LAB — BRAIN NATRIURETIC PEPTIDE: B Natriuretic Peptide: 342 pg/mL — ABNORMAL HIGH (ref 0.0–100.0)

## 2023-06-10 LAB — AMMONIA: Ammonia: 29 umol/L (ref 9–35)

## 2023-06-10 LAB — VITAMIN B12: Vitamin B-12: 574 pg/mL (ref 180–914)

## 2023-06-10 MED ORDER — POTASSIUM CHLORIDE 10 MEQ/100ML IV SOLN
10.0000 meq | INTRAVENOUS | Status: AC
  Administered 2023-06-10 (×4): 10 meq via INTRAVENOUS
  Filled 2023-06-10 (×4): qty 100

## 2023-06-10 MED ORDER — POTASSIUM CHLORIDE CRYS ER 20 MEQ PO TBCR
40.0000 meq | EXTENDED_RELEASE_TABLET | Freq: Once | ORAL | Status: AC
Start: 2023-06-10 — End: 2023-06-10
  Administered 2023-06-10: 40 meq via ORAL
  Filled 2023-06-10: qty 2

## 2023-06-10 MED ORDER — ARFORMOTEROL TARTRATE 15 MCG/2ML IN NEBU
15.0000 ug | INHALATION_SOLUTION | Freq: Two times a day (BID) | RESPIRATORY_TRACT | Status: DC
  Administered 2023-06-10 – 2023-06-16 (×12): 15 ug via RESPIRATORY_TRACT
  Filled 2023-06-10 (×12): qty 2

## 2023-06-10 MED ORDER — SODIUM CHLORIDE 0.9 % IV SOLN
1.0000 g | Freq: Three times a day (TID) | INTRAVENOUS | Status: DC
  Administered 2023-06-10 – 2023-06-11 (×4): 1 g via INTRAVENOUS
  Filled 2023-06-10 (×4): qty 20

## 2023-06-10 MED ORDER — BUDESONIDE 0.5 MG/2ML IN SUSP
0.5000 mg | Freq: Two times a day (BID) | RESPIRATORY_TRACT | Status: DC
  Administered 2023-06-10 – 2023-06-16 (×12): 0.5 mg via RESPIRATORY_TRACT
  Filled 2023-06-10 (×12): qty 2

## 2023-06-10 MED ORDER — POTASSIUM CHLORIDE IN NACL 40-0.9 MEQ/L-% IV SOLN: INTRAVENOUS | Status: DC

## 2023-06-10 MED ORDER — CARVEDILOL 12.5 MG PO TABS
12.5000 mg | ORAL_TABLET | Freq: Two times a day (BID) | ORAL | Status: DC
  Administered 2023-06-10 – 2023-06-11 (×2): 12.5 mg via ORAL
  Filled 2023-06-10 (×4): qty 1

## 2023-06-10 MED ORDER — MAGNESIUM SULFATE 2 GM/50ML IV SOLN
2.0000 g | Freq: Once | INTRAVENOUS | Status: AC
  Administered 2023-06-10: 2 g via INTRAVENOUS
  Filled 2023-06-10: qty 50

## 2023-06-10 NOTE — Progress Notes (Signed)
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 She was transported to ED by EMS, with complaints of unmanaged pain following a fall. [facility] was notified of this transfer. She was admitted 06/08/23 with a diagnosis of Compression Fracture. Per Dr. Gordy Savers with [facility], this is a related hospital admission.   Patient remains lethargic sleeping most of the time. When she does awaken she only answers short one word questions. Medical team is recommending SNF for skilled rehab. Patient will need to revoke hospice services in order to pursue. Talked with both SO and daughter and daughter is considering option of revocation. Will follow up with her tomorrow  Patient is inpatient appropriate with acute treatment for recent fall with compression fracture, need for IVAB and electrolyte imbalances   Vital Signs: 97.9/80/19    192/85     spO2 94% 2L nasal cannula  I&O: not recorded/2300 Abnormal labs: pH 7.63, pCO2 38, pO2 58, Bicarb 40.1, Sodium 131, K+ 2.6, BUN 7, BNP 342, WBC 18.8 Imaging: none new  IV/PRN Meds: IV Dilaudid 0.5mg  x2, Zofran 4mg  IV x1, KCL IV x4, Meropenem 1 gram IV q8H, Mag Sulfate 2g IV once, NS w/37meq of KCL at 76ml/H   Problem List as per Progress Note Dr. Onalee Hua Tat 06/10/2023 Intractable back pain/increased debility/physical deconditioning Compression fracture L4 -TLSO brace -Judicious opioids--continue IV Dilaudid every 3 hours as needed for now -PT evaluation>>SNF   Urinary retention -Foley catheter inserted 06/08/2023 -UA 11-20WBC   Pyuria -Follow urine cultures   Acute Metabolic Encephalopathy -1/27 more somnolent, but follow commands -CT brain -ammonia -VBG -d/c opioids and hypnotic meds temporarily -UA as above -CXR   Aspiration pneumonitis -start ceftriaxone to also cover urine -CXR   PRES history Optimize BP control Poor compliance  with meds at home--pt endorses this   Tobacco abuse Tobacco cessation discussed Nicoderm patch   Opioid dependence (HCC) PDMP reviewed Oxycodone 10 mg, #90--refilled 06/07/23 Morphine ER 15 mg, #30--filled 06/04/23 Morphine concentrate 100mg /5cc--30 cc--filled 05/31/23, 05/20/23 Xanax 1 mg, #45, filled 06/12/23, 05/19/33  Discharge Planning:  Ongoing, hospital suggesting SNF, talked with daughter about possible revocation  Family Contact: talked with SO and daughter  IDT: updated  Goals of Care: DNR Thea Gist, BSN RN Hospice hospital liaison [phone]

## 2023-06-10 NOTE — [place] FL2 (Signed)
[place] MEDICAID FL2 LEVEL OF CARE FORM     IDENTIFICATION  Patient Name: Jessica Bell Birthdate: Nov 08, 1960 Sex: female Admission Date (Current Location): 06/08/2023  [facility] and [place] Number:  [employer] and Address:  [facility],  [address] [place] [address]      Provider Number: [id number]  Attending Physician Name and Address:  Catarina Hartshorn, MD  Relative Name and Phone Number:       Current Level of Care: Hospital Recommended Level of Care: Skilled Nursing Facility Prior Approval Number:    Date Approved/Denied:   PASRR Number: [id number]  Discharge Plan: SNF    Current Diagnoses: Patient Active Problem List   Diagnosis Date Noted   Fall at home, initial encounter 06/09/2023   (HFpEF) heart failure with preserved ejection fraction (HCC) 06/09/2023   Acute urinary retention 06/09/2023   Uncontrolled pain 06/09/2023   Compression fracture of fourth lumbar vertebra (HCC) 06/08/2023   Acute encephalopathy 10/13/2021   Acquired hypothyroidism 09/28/2021   PRES (posterior reversible encephalopathy syndrome) 09/19/2021   Opioid dependence (HCC) 09/19/2021   Tobacco abuse 09/19/2021   Chronic diastolic CHF (congestive heart failure) (HCC) 08/23/2021   COPD (chronic obstructive pulmonary disease) (HCC) 05/10/2021   Gastroesophageal reflux disease 11/06/2017   History of DVT of lower extremity (2015 in [place]) 05/01/2017   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) 03/20/2017   Antiplatelet or antithrombotic long-term use 03/20/2017   Anxiety, generalized 01/05/2016   CAD S/P percutaneous coronary angioplasty 01/05/2016   Dyslipidemia 01/05/2016   PTSD (post-traumatic stress disorder) 01/05/2016   Chronic pain syndrome 11/09/2015   Essential hypertension 11/09/2015    Orientation RESPIRATION BLADDER Height & Weight     Self  O2 (2L) Incontinent, Indwelling catheter Weight: 181 lb (82.1 kg) Height:  5\' 4"  (162.6  cm)  BEHAVIORAL SYMPTOMS/MOOD NEUROLOGICAL BOWEL NUTRITION STATUS      Continent Diet (Heart healthy)  AMBULATORY STATUS COMMUNICATION OF NEEDS Skin   Extensive Assist Verbally Normal                       Personal Care Assistance Level of Assistance  Bathing, Feeding, Dressing Bathing Assistance: Limited assistance Feeding assistance: Independent Dressing Assistance: Limited assistance     Functional Limitations Info  Sight, Speech, Hearing Sight Info: Adequate Hearing Info: Adequate Speech Info: Adequate    SPECIAL CARE FACTORS FREQUENCY  PT (By licensed PT), OT (By licensed OT)     PT Frequency: 5 times weekly OT Frequency: 5 times weekly            Contractures Contractures Info: Not present    Additional Factors Info  Code Status, Allergies Code Status Info: DNR Allergies Info: Amoxicillin-pot Clavulanate, Duloxetine Hcl, Fluoxetine, Hydroxyzine Hcl, Mirtazapine, Nsaids, Sertraline, Sulfamethoxazole-trimethoprim, Tramadol, Bupropion, Lorazepam           Current Medications (06/10/2023):  This is the current hospital active medication list Current Facility-Administered Medications  Medication Dose Route Frequency Provider Last Rate Last Admin   acetaminophen (TYLENOL) tablet 650 mg  650 mg Oral Q6H PRN Adefeso, Oladapo, DO       Or   acetaminophen (TYLENOL) suppository 650 mg  650 mg Rectal Q6H PRN Adefeso, Oladapo, DO       acetaminophen (TYLENOL) tablet 1,000 mg  1,000 mg Oral Q8H Tat, David, MD   1,000 mg at 06/10/23 0416   albuterol (PROVENTIL) (2.5 MG/3ML) 0.083% nebulizer solution 3 mL  3 mL Inhalation  Q4H PRN Frankey Shown, DO       amLODipine (NORVASC) tablet 10 mg  10 mg Oral Daily Adefeso, Oladapo, DO   10 mg at 06/10/23 0942   atorvastatin (LIPITOR) tablet 80 mg  80 mg Oral Daily Adefeso, Oladapo, DO   80 mg at 06/10/23 0942   carvedilol (COREG) tablet 6.25 mg  6.25 mg Oral BID Adefeso, Oladapo, DO   6.25 mg at 06/10/23 7829   Chlorhexidine  Gluconate Cloth 2 % PADS 6 each  6 each Topical Q0600 Tat, Onalee Hua, MD   6 each at 06/10/23 0444   clopidogrel (PLAVIX) tablet 75 mg  75 mg Oral Daily Adefeso, Oladapo, DO   75 mg at 06/10/23 0942   enoxaparin (LOVENOX) injection 40 mg  40 mg Subcutaneous Q24H Adefeso, Oladapo, DO   40 mg at 06/10/23 0941   gabapentin (NEURONTIN) capsule 300 mg  300 mg Oral TID Catarina Hartshorn, MD   300 mg at 06/10/23 0942   HYDROmorphone (DILAUDID) injection 0.5 mg  0.5 mg Intravenous Q3H PRN Adefeso, Oladapo, DO   0.5 mg at 06/10/23 0607   ipratropium-albuterol (DUONEB) 0.5-2.5 (3) MG/3ML nebulizer solution 3 mL  3 mL Nebulization BID Tat, Onalee Hua, MD   3 mL at 06/10/23 0806   levothyroxine (SYNTHROID) tablet 100 mcg  100 mcg Oral Q0600 Adefeso, Oladapo, DO   100 mcg at 06/10/23 5621   losartan (COZAAR) tablet 50 mg  50 mg Oral Daily Tat, Onalee Hua, MD   50 mg at 06/10/23 3086   nicotine (NICODERM CQ - dosed in mg/24 hours) patch 21 mg  21 mg Transdermal Daily Adefeso, Oladapo, DO   21 mg at 06/10/23 0942   ondansetron (ZOFRAN) tablet 4 mg  4 mg Oral Q6H PRN Adefeso, Oladapo, DO       Or   ondansetron (ZOFRAN) injection 4 mg  4 mg Intravenous Q6H PRN Adefeso, Oladapo, DO   4 mg at 06/10/23 0421   oxyCODONE (Oxy IR/ROXICODONE) immediate release tablet 5 mg  5 mg Oral TID PRN Tat, Onalee Hua, MD       pantoprazole (PROTONIX) EC tablet 40 mg  40 mg Oral Daily Adefeso, Oladapo, DO   40 mg at 06/10/23 5784     Discharge Medications: Please see discharge summary for a list of discharge medications.  Relevant Imaging Results:  Relevant Lab Results:   Additional Information SSN: [id number] [place]

## 2023-06-10 NOTE — Evaluation (Signed)
Physical Therapy Evaluation Patient Details Name: Jessica Bell MRN: [id number] DOB: 09-17-1960 Today's Date: 06/10/2023  History of Present Illness  KARLEEN SEEBECK is a 63 y.o. female with medical history significant of hypertension, heart failure with preserved EF, hyperlipidemia, coronary artery disease with history of MI, hypothyroidism, chronic pain on chronic opiate therapy, PRES who presents to the emergency department due to low back pain and bilateral leg pain.  Apparently, patient sustained a fall about a week ago and has since been complaining of worsening back pain with radiation to the legs bilaterally and increased difficulty in ambulation.  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  Patient's boyfriend called hospice nurse last night due to difficulty been able to take care of patient at home by himself, EMS was activated and patient was sent to the ED for further evaluation and management.    Clinical Impression  Patient lying in bed on therapist arrival.  Lethargic. Currently on 2 L of O2.  She is able to answer yes and no questions about 50% of the time.  She often needs questions and requests repeated.  Patient keeps her eyes shut throughout assessment.  She is wearing a back brace.  After max cueing and mod to max assist from PT she is able to roll from lying on her right side to lying on her left side.  Did not attempts sitting up on the side of the bed or transfers due to patient lethargy.  She was agreeable to considering SNF for rehab.  Patient will benefit from continued skilled therapy services during the remainder of her hospital stay and at the next recommended venue of care to address deficits and promote return to optimal function.           If plan is discharge home, recommend the following: A lot of help with walking and/or transfers;A lot of help with bathing/dressing/bathroom;Assist for transportation;Help with stairs or ramp for entrance    Can travel by private vehicle   No    Equipment Recommendations None recommended by PT  Recommendations for Other Services       Functional Status Assessment Patient has had a recent decline in their functional status and demonstrates the ability to make significant improvements in function in a reasonable and predictable amount of time.     Precautions / Restrictions Precautions Precautions: Back Precaution Comments: wearing back brace Required Braces or Orthoses: Spinal Brace Restrictions Weight Bearing Restrictions Per Provider Order: No      Mobility  Bed Mobility Overal bed mobility: Needs Assistance Bed Mobility: Rolling Rolling: Mod assist, Max assist         General bed mobility comments: mod to max assist for rolling to left; needs max cueing; patient keeps her eyes shut throughout assessment    Transfers                   General transfer comment: did not attempt transfer due to patient lethargy    Ambulation/Gait                  Stairs            Wheelchair Mobility     Tilt Bed    Modified Rankin (Stroke Patients Only)       Balance  Pertinent Vitals/Pain Pain Assessment Pain Assessment: 0-10 Pain Score: 9  Pain Location: back Pain Intervention(s): Monitored during session, Repositioned    Home Living Family/patient expects to be discharged to:: Unsure Living Arrangements: Spouse/significant other                      Prior Function Prior Level of Function : Needs assist             Mobility Comments: patient unable to elaborate on previous status       Extremity/Trunk Assessment        Lower Extremity Assessment Lower Extremity Assessment: Generalized weakness    Cervical / Trunk Assessment Cervical / Trunk Assessment: Kyphotic  Communication   Communication Communication: Difficulty following  commands/understanding;Difficulty communicating thoughts/reduced clarity of speech Following commands: Follows one step commands inconsistently  Cognition Arousal: Lethargic   Overall Cognitive Status: No family/caregiver present to determine baseline cognitive functioning                                 General Comments: able to answer yes and no questions 50% of the time        General Comments General comments (skin integrity, edema, etc.): did not attempt sitting up on the edge of bed due to patient lethargy    Exercises     Assessment/Plan    PT Assessment Patient needs continued PT services  PT Problem List Decreased strength;Decreased activity tolerance;Decreased mobility;Pain       PT Treatment Interventions Functional mobility training;Therapeutic activities;Therapeutic exercise;Balance training;Patient/family education    PT Goals (Current goals can be found in the Care Plan section)  Acute Rehab PT Goals Patient Stated Goal: return home after going to rehab PT Goal Formulation: With patient Time For Goal Achievement: 06/24/23 Potential to Achieve Goals: Fair    Frequency Min 3X/week     Co-evaluation               AM-PAC PT "6 Clicks" Mobility  Outcome Measure Help needed turning from your back to your side while in a flat bed without using bedrails?: A Lot Help needed moving from lying on your back to sitting on the side of a flat bed without using bedrails?: A Lot Help needed moving to and from a bed to a chair (including a wheelchair)?: Total Help needed standing up from a chair using your arms (e.g., wheelchair or bedside chair)?: Total Help needed to walk in hospital room?: Total Help needed climbing 3-5 steps with a railing? : Total 6 Click Score: 8    End of Session Equipment Utilized During Treatment: Back brace Activity Tolerance: Patient limited by lethargy;Patient limited by pain Patient left: in bed;with call bell/phone  within reach;with bed alarm set Nurse Communication: Mobility status PT Visit Diagnosis: Other abnormalities of gait and mobility (R26.89);Muscle weakness (generalized) (M62.81);History of falling (Z91.81)    Time: 1610-9604 PT Time Calculation (min) (ACUTE ONLY): 18 min   Charges:   PT Evaluation $PT Eval Low Complexity: 1 Low   PT General Charges $$ ACUTE PT VISIT: 1 Visit         8:03 AM, 06/10/23 Nyellie Yetter Small Raoul Ciano MPT [facility] [place] [phone] Ph:[phone]

## 2023-06-10 NOTE — Progress Notes (Signed)
Pt had 2 episodes of vomiting and one episode of large loose stool.  She had episode of gagging and coughing while taking one Tylenol. Later on during the night her SPO2 was 91% on room air. She was placed on 2 L [place] where she sustained an SPO2 of 96%. She has complained of back pain. 1 dose of PRN dilaudid given this AM since she c/o pain and is hypertensive. 1 dose of PRN zofran given after 2nd episode of vomiting which seemed to relieve nausea.  She remained very sleepy overnight and did not engage in her care very much and answered most questions with a "yes" or "no". Urine output overnight was about 1550 cc from urinary foley. No other acute events overnight. Kellogg RN

## 2023-06-10 NOTE — Progress Notes (Signed)
   06/10/23 0442  Assess: MEWS Score  Temp 99.5 F (37.5 C)  BP (!) 151/71  MAP (mmHg) 90  Pulse Rate 66  Resp (!) 21  Level of Consciousness Responds to Voice  SpO2 91 %  O2 Device Room Air  Assess: MEWS Score  MEWS Temp 0  MEWS Systolic 0  MEWS Pulse 0  MEWS RR 1  MEWS LOC 1  MEWS Score 2  MEWS Score Color Yellow  Assess: if the MEWS score is Yellow or Red  Were vital signs accurate and taken at a resting state? Yes  Does the patient meet 2 or more of the SIRS criteria? No  MEWS guidelines implemented  Yes, yellow  Treat  MEWS Interventions Considered administering scheduled or prn medications/treatments as ordered  Take Vital Signs  Increase Vital Sign Frequency  Yellow: Q2hr x1, continue Q4hrs until patient remains green for 12hrs  Escalate  MEWS: Escalate Yellow: Discuss with charge nurse and consider notifying provider and/or RRT  Notify: Charge Nurse/RN  Name of Charge Nurse/RN Notified Doctor, hospital  Assess: SIRS CRITERIA  SIRS Temperature  0  SIRS Respirations  1  SIRS Pulse 0  SIRS WBC 0  SIRS Score Sum  1

## 2023-06-10 NOTE — Plan of Care (Signed)
  Problem: Acute Rehab PT Goals(only PT should resolve) Goal: Pt Will Go Supine/Side To Sit Outcome: Progressing Flowsheets (Taken 06/10/2023 0803) Pt will go Supine/Side to Sit: with moderate assist Goal: Patient Will Perform Sitting Balance Outcome: Progressing Flowsheets (Taken 06/10/2023 0803) Patient will perform sitting balance: with moderate assist Goal: Patient Will Transfer Sit To/From Stand Outcome: Progressing Flowsheets (Taken 06/10/2023 0803) Patient will transfer sit to/from stand: with moderate assist Goal: Pt Will Transfer Bed To Chair/Chair To Bed Outcome: Progressing Flowsheets (Taken 06/10/2023 0803) Pt will Transfer Bed to Chair/Chair to Bed: with mod assist

## 2023-06-10 NOTE — Progress Notes (Signed)
PROGRESS NOTE  Jessica Bell MWU:[id number] DOB: 02/13/1961 DOA: 06/08/2023 PCP: Pcp, No  Brief History:  63 year old female with a history of COPD, chronic respiratory failure on 5 L, tobacco abuse, HFpEF, hypertension, PRES, hyperlipidemia, opioid dependence, coronary disease, GERD, hypothyroidism presenting with worsening back pain radiating to bilateral legs.  The patient sustained a mechanical fall about 2 weeks prior to admission.  Since that period time, the patient has had increasing pain.  At baseline, the patient has been ambulating with a walker.  However because of the pain she is having more difficulty.  She is only be able to take a few steps.  Her significant other is having difficulty caring for her.  As result, she was brought to the emergency department for further evaluation and treatment.  The patient denies any fevers, chills, headache, chest pain, shortness breath, hemoptysis, nausea, vomiting, diarrhea, abdominal pain. Notably, the patient administers her own medications.  She states that she has not been 100% compliant. In the ED, the patient was afebrile hemodynamically stable with oxygen saturation 93 to 96% on 5 L.  WBC 11.5, hemoglobin 11.5, platelets 203.  Sodium 135, potassium 3.6, bicarbonate 34, serum creatinine 0.52.  LFTs were unremarkable.  MRI of the lumbar spine showed acute to subacute compression fracture with 20% loss in height.  There was 2 to 3 mm bony retropulsion.  It appeared benign and mechanical in appearance.  There is multifactorial L3-L4 moderate spinal stenosis with moderate right and mild left L3 foraminal stenosis.  There is reactive marrow edema bilateral L3-4 facets secondary to facet arthritis.  There is mild spinal stenosis L1-2, L2-3. CT lumbar spine had similar findings.  Neurosurgery, Dr. Conchita Paris was consulted and recommended continue medical therapy, pain control, and outpatient follow-up. The patient significant other stated that he  could no longer take care of the patient because of the patient's progressive debility.  As result the patient was admitted for further evaluation and treatment.   Assessment/Plan: Intractable back pain/increased debility/physical deconditioning Compression fracture L4 -TLSO brace -Judicious opioids--continue IV Dilaudid every 3 hours as needed for now -PT evaluation>>SNF   Urinary retention -Foley catheter inserted 06/08/2023 -UA 11-20WBC   Pyuria -Follow urine cultures  Acute Metabolic Encephalopathy -1/27 more somnolent, but follow commands -CT brain -ammonia -VBG -d/c opioids and hypnotic meds temporarily -UA as above -CXR  Aspiration pneumonitis -start ceftriaxone to also cover urine -CXR   PRES history Optimize BP control Poor compliance with meds at home--pt endorses this   Tobacco abuse Tobacco cessation discussed Nicoderm patch   Opioid dependence (HCC) PDMP reviewed Oxycodone 10 mg, #90--refilled 06/07/23 Morphine ER 15 mg, #30--filled 06/04/23 Morphine concentrate 100mg /5cc--30 cc--filled 05/31/23, 05/20/23 Xanax 1 mg, #45, filled 06/12/23, 05/19/33   Chronic HFpEF Clinically euvolemic 05/11/2021 echo EF 50-55%, G1DD Distal septal HK Restart carvedilol   COPD (chronic obstructive pulmonary disease) (HCC) As needed bronchodilators   Essential hypertension Restart carvedilol   Dyslipidemia Continue statin   CAD S/P percutaneous coronary angioplasty No chest pain presently Continue Plavix and statin   Hypothyroidism Continue synthroid   Obesity BMI 31.07 Lifestyle modification                   Family Communication:   significant other updated 1/27   Consultants:  none   Code Status:  DNR   DVT Prophylaxis:  [place] Lovenox     Procedures: As Listed in Progress Note Above   Antibiotics: Ceftriaxone  1/27         Subjective: Pt is somnolent, but answers yes/no questions.  Denies cp, n/v/d, abd pain, headache.  Complains of  back pain.  Objective: Vitals:   06/10/23 0443 06/10/23 0557 06/10/23 0626 06/10/23 0808  BP:  (!) 193/83 (!) 164/103   Pulse:  88    Resp:  20    Temp:  98.8 F (37.1 C)    TempSrc:  Axillary    SpO2: 98% 97%  93%  Weight:      Height:        Intake/Output Summary (Last 24 hours) at 06/10/2023 1308 Last data filed at 06/10/2023 0035 Gross per 24 hour  Intake 0 ml  Output 1550 ml  Net -1550 ml   Weight change:  Exam:  General:  Pt is somnolent, follows commands appropriately, not in acute distress HEENT: No icterus, No thrush, No neck mass, /AT Cardiovascular: RRR, S1/S2, no rubs, no gallops Respiratory: bibasilar rales.  Minimal basilar wheeze Abdomen: Soft/+BS, non tender, non distended, no guarding Extremities: No edema, No lymphangitis, No petechiae, No rashes, no synovitis Neuro:  CN II-XII intact, strength 4-/5 in RUE, RLE, strength 4-/5 LUE, LLE; sensation intact bilateral; no dysmetria; babinski equivocal    Data Reviewed: I have personally reviewed following labs and imaging studies Basic Metabolic Panel: Recent Labs  Lab 06/08/23 1223 06/09/23 0513  NA 135 135  K 4.1 3.6  CL 92* 93*  CO2 34* 34*  GLUCOSE 105* 101*  BUN 7* 5*  CREATININE 0.52 0.48  CALCIUM 8.9 9.1  MG  --  1.6*  PHOS  --  2.7   Liver Function Tests: Recent Labs  Lab 06/09/23 0513  AST 16  ALT 18  ALKPHOS 96  BILITOT 0.8  PROT 6.2*  ALBUMIN 3.3*   No results for input(s): "LIPASE", "AMYLASE" in the last 168 hours. No results for input(s): "AMMONIA" in the last 168 hours. Coagulation Profile: No results for input(s): "INR", "PROTIME" in the last 168 hours. CBC: Recent Labs  Lab 06/08/23 1223 06/09/23 0513  WBC 11.5* 12.6*  NEUTROABS 9.3*  --   HGB 11.5* 11.5*  HCT 34.4* 35.9*  MCV 86.9 84.5  PLT 254 303   Cardiac Enzymes: No results for input(s): "CKTOTAL", "CKMB", "CKMBINDEX", "TROPONINI" in the last 168 hours. BNP: Invalid input(s): "POCBNP" CBG: No  results for input(s): "GLUCAP" in the last 168 hours. HbA1C: No results for input(s): "HGBA1C" in the last 72 hours. Urine analysis:    Component Value Date/Time   COLORURINE STRAW (A) 06/08/2023 1444   APPEARANCEUR CLEAR 06/08/2023 1444   LABSPEC 1.005 06/08/2023 1444   PHURINE 7.0 06/08/2023 1444   GLUCOSEU NEGATIVE 06/08/2023 1444   HGBUR SMALL (A) 06/08/2023 1444   BILIRUBINUR NEGATIVE 06/08/2023 1444   KETONESUR NEGATIVE 06/08/2023 1444   PROTEINUR NEGATIVE 06/08/2023 1444   NITRITE NEGATIVE 06/08/2023 1444   LEUKOCYTESUR MODERATE (A) 06/08/2023 1444   Sepsis Labs: @LABRCNTIP (procalcitonin:4,lacticidven:4) ) Recent Results (from the past 240 hours)  Urine Culture     Status: None   Collection Time: 06/08/23  7:04 PM   Specimen: Urine, Clean Catch  Result Value Ref Range Status   Specimen Description   Final    URINE, CLEAN CATCH Performed at [facility], [address], [place], [place] [address]    Special Requests   Final    NONE Performed at [facility], [address], [place], [place] [address]    Culture   Final  NO GROWTH Performed at [facility], [address] [place], [place] [address]    Report Status 06/10/2023 FINAL  Final     Scheduled Meds:  acetaminophen  1,000 mg Oral Q8H   amLODipine  10 mg Oral Daily   arformoterol  15 mcg Nebulization BID   atorvastatin  80 mg Oral Daily   budesonide (PULMICORT) nebulizer solution  0.5 mg Nebulization BID   carvedilol  6.25 mg Oral BID   Chlorhexidine Gluconate Cloth  6 each Topical Q0600   clopidogrel  75 mg Oral Daily   enoxaparin (LOVENOX) injection  40 mg Subcutaneous Q24H   ipratropium-albuterol  3 mL Nebulization BID   levothyroxine  100 mcg Oral Q0600   losartan  50 mg Oral Daily   nicotine  21 mg Transdermal Daily   pantoprazole  40 mg Oral Daily   Continuous Infusions:  Procedures/Studies: MR LUMBAR SPINE WO CONTRAST Result Date: 06/08/2023 CLINICAL DATA:  Initial evaluation for  acute compression fracture, lower back pain EXAM: MRI LUMBAR SPINE WITHOUT CONTRAST TECHNIQUE: Multiplanar, multisequence MR imaging of the lumbar spine was performed. No intravenous contrast was administered. COMPARISON:  CT from earlier the same day. FINDINGS: Segmentation:  Examination degraded by motion artifact. Standard segmentation. Lowest well-formed disc space labeled the L5-S1 level. Alignment: Mild straightening of the normal lumbar lordosis with trace dextroscoliosis. No significant listhesis. Vertebrae: Acute to subacute compression fracture involving the superior endplate of L4 with mild 20% height loss and trace 2-3 mm bony retropulsion. This is benign/mechanical in appearance. Marrow edema at the adjacent inferior endplate of L3 favored to be reactive/degenerative. No convincing fracture seen at this location on prior CT. No other acute or recent fracture. Mild chronic compression deformities involving the superior endplates of T12 and L2 noted. Bone marrow signal intensity mildly heterogeneous but overall within normal limits. No worrisome osseous lesions. Reactive marrow edema present about the bilateral L3-4 facets due to facet arthritis. Postoperative changes from prior lateral screw fixation across the right SI joint noted. Conus medullaris and cauda equina: Conus extends to the L1-2 level. Conus and cauda equina appear normal. Paraspinal and other soft tissues: Mild edema noted within the psoas musculature adjacent to the L4 fracture. Paraspinous soft tissues demonstrate no other acute finding. Asymmetric right renal atrophy with a few scattered T2 hyperintense cysts, largest of which measures 1.5 cm. These are benign in appearance, no follow-up imaging recommended. Subcentimeter simple cyst noted with subcapsular posterior right hepatic lobe, also likely benign. Disc levels: T12-L1: Minimal disc bulge. Mild bilateral facet hypertrophy. No stenosis. L1-2: Degenerative intervertebral disc space  narrowing with diffuse disc bulge and disc desiccation. Superimposed irregular right subarticular to extraforaminal disc protrusion (series 6, image 10). Superimposed reactive endplate spurring. Mild facet hypertrophy. Prominence of the dorsal epidural fat. Mild spinal stenosis. Foramina remain patent. L2-3: Degenerative intervertebral disc space narrowing with diffuse disc bulge and reactive endplate spurring, asymmetric to the right. Moderate facet hypertrophy. Resultant mild spinal stenosis. Mild right L2 foraminal narrowing. Left neural foramina remains patent. L3-4: Disc desiccation with diffuse disc bulge. Possible superimposed right subarticular disc extrusion with superior migration, best seen on axial T2 weighted sequence (series 6, images 22, 21). This is not well seen on corresponding sequences. Severe bilateral facet arthrosis with reactive marrow edema. Trace 2-3 mm bony retropulsion related to the L4 fracture. Mild epidural lipomatosis. Resultant moderate spinal stenosis. Moderate right with mild left L3 foraminal narrowing. L4-5: Advanced degenerative disc space narrowing with disc desiccation and  diffuse disc bulge. Reactive endplate spurring. Moderate facet hypertrophy. Resultant mild bilateral subarticular stenosis. Central canal remains patent. Mild right greater than left L4 foraminal stenosis. L5-S1: Disc desiccation with minimal disc bulge. Small central annular fissure. Moderate left greater than right facet hypertrophy. No significant spinal stenosis. Mild left L5 foraminal narrowing. Right neural foramen remains patent. IMPRESSION: 1. Acute to subacute compression fracture involving the superior endplate of L4 with mild 20% height loss and trace 2-3 mm bony retropulsion. This is benign/mechanical in appearance. 2. Multifactorial degenerative changes at L3-4 with resultant moderate spinal stenosis, with moderate right and mild left L3 foraminal narrowing. Possible superimposed right  subarticular disc extrusion with superior migration at this level as above, not entirely certain given motion degradation on this exam. 3. Additional multilevel degenerative spondylosis and facet hypertrophy as above. Resultant mild spinal stenosis at L1-2 and L2-3. 4. Reactive marrow edema about the bilateral L3-4 facets due to facet arthritis. Finding could serve as a source for lower back pain. Electronically Signed   By: Rise Mu M.D.   On: 06/08/2023 17:39   CT Lumbar Spine Wo Contrast Result Date: 06/08/2023 CLINICAL DATA:  63 year old female with low back pain, bilateral leg pain. EXAM: CT LUMBAR SPINE WITHOUT CONTRAST TECHNIQUE: Multidetector CT imaging of the lumbar spine was performed without intravenous contrast administration. Multiplanar CT image reconstructions were also generated. RADIATION DOSE REDUCTION: This exam was performed according to the departmental dose-optimization program which includes automated exposure control, adjustment of the mA and/or kV according to patient size and/or use of iterative reconstruction technique. COMPARISON:  CT Abdomen and Pelvis 10/14/2021. FINDINGS: Segmentation: Normal. Alignment: Straightening of lumbar lordosis not significantly changed from 2023. No significant scoliosis. Mild chronic retrolisthesis of L1 on L2. Vertebrae: L4 superior endplate compression fracture appears unhealed and comminuted. 30% loss of vertebral body height. Prominent new adjacent vacuum disc at L3-L4. Retropulsion of the posterosuperior endplate up to 6 mm. L4 pedicles and posterior elements appear intact, aligned, chronically degenerated. And degenerative appearing bilateral L4-L5 facet ankylosis appears progressed since 2023. Superimposed mildly displaced right L3 transverse process fracture is new since 2023. Other lumbar vertebrae appear stable since 2023 including mild chronic L2 superior endplate deformity. Underlying osteopenia. Superimposed chronic right SI joint  arthrodesis hardware. Visible sacrum and SI joints appears stable. Paraspinal and other soft tissues: Aortoiliac calcified atherosclerosis. Chronic right renal atrophy. Noncontrast visible abdominal viscera appear stable. Distended urinary bladder. Diverticulosis of the distal large bowel. Disc levels: Stable CT appearance of chronic lumbar spine degeneration since 2023 except L3-L4: Progressed vacuum disc. Posttraumatic mild retropulsion of the L4 posterosuperior endplate and increased multifactorial moderate to severe spinal stenosis at that level (series 4, image 75 now versus series 3, image 38 there in 2023. Superimposed chronic bulky facet arthropathy. Chronic moderate to severe bilateral L3 foraminal stenosis has not significantly changed. IMPRESSION: 1. Acute to subacute: - L4 compression fracture with 30% loss of vertebral body height, mild retropulsion, and subsequent increased moderate to severe multifactorial spinal stenosis at L3-L4. Chronic L3 foraminal stenosis not significantly changed. - right L3 transverse process fracture. 2. Other chronic lumbar spine degeneration otherwise appears stable by CT since 2023. 3. Distended urinary bladder, query Urinary Retention. 4. Aortic Atherosclerosis (ICD10-I70.0). Large bowel diverticulosis. Chronic right renal atrophy. Electronically Signed   By: Odessa Fleming M.D.   On: 06/08/2023 13:01    Catarina Hartshorn, DO  [facility]  If 7PM-7AM, please contact night-coverage [email or web address] Password [email or web address] 06/10/2023, 1:08 PM   LOS: 2  days

## 2023-06-10 NOTE — Progress Notes (Signed)
Date and time results received: 06/10/23 1431 (use smartphrase ".now" to insert current time)  Test: ph venous Critical Value: 7.63  Name of Provider Notified: Tat

## 2023-06-10 NOTE — TOC Initial Note (Signed)
Transition of Care [facility]) - Initial/Assessment Note    Patient Details  Name: Jessica Bell MRN: [id number] Date of Birth: [birth year]  Transition of [facility]) CM/SW Contact:    Villa Herb, LCSWA Phone Number: 06/10/2023, 11:39 AM  Clinical Narrative:                 CSW updated that PT is recommending SNF for pt at D/C. CSW spoke with pt at bedside to review SNF recommendation. At this time pt is agreeable to SNF referral being sent out to local facilities for review. CSW updated pt that she will need to revoke hospice services in order to go to SNF. Pt is understanding and agreeable to this. CSW reached out to [employer] with [facility] to update that pt will need to revoke hospice services, she is understanding and states she will visit pt today to work on this. TOC to follow.   Expected Discharge Plan: Skilled Nursing Facility Barriers to Discharge: Continued Medical Work up   Patient Goals and CMS Choice Patient states their goals for this hospitalization and ongoing recovery are:: go to SNF CMS Medicare.gov Compare Post Acute Care list provided to:: Patient Choice offered to / list presented to : Patient [place] ownership interest in [facility].provided to:: Patient    Expected Discharge Plan and Services In-house Referral: Clinical Social Work Discharge Planning Services: CM Consult Post Acute Care Choice: Skilled Nursing Facility Living arrangements for the past 2 months: Single Family Home                                      Prior Living Arrangements/Services Living arrangements for the past 2 months: Single Family Home Lives with:: Significant Other Patient language and need for interpreter reviewed:: Yes Do you feel safe going back to the place where you live?: Yes      Need for Family Participation in Patient Care: Yes (Comment) Care giver support system in place?: Yes (comment) Current home services: DME Criminal Activity/Legal  Involvement Pertinent to Current Situation/Hospitalization: No - Comment as needed  Activities of Daily Living   ADL Screening (condition at time of admission) Independently performs ADLs?: No Does the patient have a NEW difficulty with bathing/dressing/toileting/self-feeding that is expected to last >3 days?: No Does the patient have a NEW difficulty with getting in/out of bed, walking, or climbing stairs that is expected to last >3 days?: No Does the patient have a NEW difficulty with communication that is expected to last >3 days?: No Is the patient deaf or have difficulty hearing?: No Does the patient have difficulty seeing, even when wearing glasses/contacts?: No Does the patient have difficulty concentrating, remembering, or making decisions?: No  Permission Sought/Granted                  Emotional Assessment Appearance:: Appears stated age Attitude/Demeanor/Rapport: Engaged Affect (typically observed): Accepting   Alcohol / Substance Use: Not Applicable Psych Involvement: No (comment)  Admission diagnosis:  Urinary retention [R33.9] Compression fracture of fourth lumbar vertebra (HCC) [S32.040A] Compression fracture of L4 vertebra, initial encounter [facility]) [S32.040A] Patient Active Problem List   Diagnosis Date Noted   Fall at home, initial encounter 06/09/2023   (HFpEF) heart failure with preserved ejection fraction (HCC) 06/09/2023   Acute urinary retention 06/09/2023   Uncontrolled pain 06/09/2023   Compression fracture of fourth lumbar vertebra (HCC) 06/08/2023   Acute encephalopathy  10/13/2021   Acquired hypothyroidism 09/28/2021   PRES (posterior reversible encephalopathy syndrome) 09/19/2021   Opioid dependence (HCC) 09/19/2021   Tobacco abuse 09/19/2021   Chronic diastolic CHF (congestive heart failure) (HCC) 08/23/2021   COPD (chronic obstructive pulmonary disease) (HCC) 05/10/2021   Gastroesophageal reflux disease 11/06/2017   History of DVT of lower  extremity (2015 in [place]) 05/01/2017   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 [place] [address] Phone: [phone] Fax: [phone]  [id number] [place] - [address] [facility] [address] [place] [address] Phone: [phone] Fax: [phone]     Social Drivers of Health (SDOH) Social History: SDOH Screenings   Food Insecurity: No Food Insecurity (06/08/2023)  Housing: Low Risk  (06/08/2023)  Transportation Needs: No Transportation Needs (06/08/2023)  Utilities: Not At Risk (06/08/2023)  Financial Resource Strain: Low Risk  (09/05/2021)   Received from [facility], [facility], [facility]  Physical Activity: Inactive (09/05/2021)   [facility], [facility]  Social Connections: Moderately Isolated (06/08/2023)  Stress: Stress Concern Present (09/05/2021)   Received from [facility], [facility], [facility]  Tobacco Use: High Risk (06/08/2023)   SDOH Interventions:     Readmission Risk Interventions    06/10/2023   11:38 AM 10/16/2021   11:41 AM 08/24/2021   11:58 AM  Readmission Risk Prevention Plan  Transportation Screening Complete Complete Complete  Home Care Screening Complete    Medication Review (RN CM) Complete    Medication Review [employer])  Complete Complete  HRI or Home Care Consult  Complete Complete  SW Recovery Care/Counseling Consult  Complete Complete  Palliative Care Screening  Not Applicable Not Applicable  Skilled Nursing  Facility  Not Applicable Not Applicable

## 2023-06-11 ENCOUNTER — Inpatient Hospital Stay (HOSPITAL_COMMUNITY): Payer: Medicare Other

## 2023-06-11 ENCOUNTER — Inpatient Hospital Stay (HOSPITAL_COMMUNITY)
Admit: 2023-06-11 | Discharge: 2023-06-11 | Disposition: A | Discharge status: Home / Self Care | Payer: Medicare Other | Attending: Internal Medicine | Admitting: Internal Medicine

## 2023-06-11 ENCOUNTER — Other Ambulatory Visit (HOSPITAL_COMMUNITY): Payer: 59

## 2023-06-11 DIAGNOSIS — R4182 Altered mental status, unspecified: Secondary | ICD-10-CM

## 2023-06-11 DIAGNOSIS — J690 Pneumonitis due to inhalation of food and vomit: Secondary | ICD-10-CM | POA: Diagnosis not present

## 2023-06-11 DIAGNOSIS — I6783 Posterior reversible encephalopathy syndrome: Secondary | ICD-10-CM

## 2023-06-11 DIAGNOSIS — S32040K Wedge compression fracture of fourth lumbar vertebra, subsequent encounter for fracture with nonunion: Secondary | ICD-10-CM | POA: Diagnosis not present

## 2023-06-11 LAB — CBC
HCT: 37.5 % (ref 36.0–46.0)
Hemoglobin: 12.7 g/dL (ref 12.0–15.0)
MCH: 27.9 pg (ref 26.0–34.0)
MCHC: 33.9 g/dL (ref 30.0–36.0)
MCV: 82.2 fL (ref 80.0–100.0)
Platelets: 395 10*3/uL (ref 150–400)
RBC: 4.56 MIL/uL (ref 3.87–5.11)
RDW: 14.3 % (ref 11.5–15.5)
WBC: 13.5 10*3/uL — ABNORMAL HIGH (ref 4.0–10.5)
nRBC: 0 % (ref 0.0–0.2)

## 2023-06-11 LAB — BASIC METABOLIC PANEL
Anion gap: 13 (ref 5–15)
BUN: 8 mg/dL (ref 8–23)
CO2: 26 mmol/L (ref 22–32)
Calcium: 9 mg/dL (ref 8.9–10.3)
Chloride: 98 mmol/L (ref 98–111)
Creatinine, Ser: 0.74 mg/dL (ref 0.44–1.00)
GFR, Estimated: >60 mL/min (ref >60)
Glucose, Bld: 123 mg/dL — ABNORMAL HIGH (ref 70–99)
Potassium: 3.3 mmol/L — ABNORMAL LOW (ref 3.5–5.1)
Sodium: 137 mmol/L (ref 135–145)

## 2023-06-11 LAB — MRSA NEXT GEN BY PCR, NASAL: MRSA by PCR Next Gen: NOT DETECTED

## 2023-06-11 LAB — PROCALCITONIN: Procalcitonin: <0.1 ng/mL

## 2023-06-11 LAB — GLUCOSE, CAPILLARY: Glucose-Capillary: 111 mg/dL — ABNORMAL HIGH (ref 70–99)

## 2023-06-11 LAB — MAGNESIUM: Magnesium: 2 mg/dL (ref 1.7–2.4)

## 2023-06-11 MED ORDER — POTASSIUM CHLORIDE IN NACL 40-0.9 MEQ/L-% IV SOLN: INTRAVENOUS | Status: AC

## 2023-06-11 MED ORDER — POTASSIUM CHLORIDE 10 MEQ/100ML IV SOLN
10.0000 meq | INTRAVENOUS | Status: AC
  Administered 2023-06-11 (×2): 10 meq via INTRAVENOUS
  Filled 2023-06-11 (×2): qty 100

## 2023-06-11 MED ORDER — METRONIDAZOLE 500 MG/100ML IV SOLN
500.0000 mg | Freq: Two times a day (BID) | INTRAVENOUS | Status: DC
  Administered 2023-06-11 – 2023-06-14 (×6): 500 mg via INTRAVENOUS
  Filled 2023-06-11 (×6): qty 100

## 2023-06-11 MED ORDER — CEFTRIAXONE SODIUM 1 G IJ SOLR
1.0000 g | INTRAMUSCULAR | Status: DC
  Administered 2023-06-11: 1 g via INTRAVENOUS
  Filled 2023-06-11: qty 10

## 2023-06-11 MED ORDER — POTASSIUM CHLORIDE CRYS ER 20 MEQ PO TBCR
40.0000 meq | EXTENDED_RELEASE_TABLET | Freq: Once | ORAL | Status: DC
  Filled 2023-06-11: qty 2

## 2023-06-11 MED ORDER — NICARDIPINE HCL IN NACL 20-0.86 MG/200ML-% IV SOLN
3.0000 mg/h | INTRAVENOUS | Status: DC
Start: 2023-06-11 — End: 2023-06-13
  Administered 2023-06-11 – 2023-06-12 (×6): 5 mg/h via INTRAVENOUS
  Administered 2023-06-12 – 2023-06-13 (×2): 3 mg/h via INTRAVENOUS
  Filled 2023-06-11 (×5): qty 200
  Filled 2023-06-11: qty 400
  Filled 2023-06-11 (×2): qty 200

## 2023-06-11 NOTE — Progress Notes (Signed)
Patient was transferred down from 300 unit to [facility] this afternoon. Foley catheter was placed on 06/08/23 and 300 unit reported it was placed in the ED. Writer discussed with Dr Tat. Keeping foley today due to critical status being put in Cardene drip and fluids. Patient being alert and oriented to self only.

## 2023-06-11 NOTE — Progress Notes (Signed)
Pt A&Ox self this shift, confused per night shift report and answering yes / no incorrectly to some questions asked. Pt with difficulty swallowing oral meds, speech eval order placed. MD Tat informed of pt with BP 207/96, request for IV BP control as pt unsafe to take oral meds. MD Tat unable to assess pt this AM and upon bedside arrival to assess pt MD noted change in pt's gaze preference as compared to yesterday's presentation. CT head, nicardipine gtt and transfer to ICU placed by MD Tat. Report given to [facility] and calls placed to pt's daughter to update her - left unanswered.

## 2023-06-11 NOTE — Progress Notes (Signed)
PROGRESS NOTE  Jessica Bell KVQ:[id number] DOB: Mar 11, 1961 DOA: 06/08/2023 PCP: Pcp, No  Brief History:  63 year old female with a history of COPD, chronic respiratory failure on 5 L, tobacco abuse, HFpEF, hypertension, PRES, hyperlipidemia, opioid dependence, coronary disease, GERD, hypothyroidism presenting with worsening back pain radiating to bilateral legs.  The patient sustained a mechanical fall about 2 weeks prior to admission.  Since that period time, the patient has had increasing pain.  At baseline, the patient has been ambulating with a walker.  However because of the pain she is having more difficulty.  She is only be able to take a few steps.  Her significant other is having difficulty caring for her.  As result, she was brought to the emergency department for further evaluation and treatment.  The patient denies any fevers, chills, headache, chest pain, shortness breath, hemoptysis, nausea, vomiting, diarrhea, abdominal pain. Notably, the patient administers her own medications.  She states that she has not been 100% compliant. In the ED, the patient was afebrile hemodynamically stable with oxygen saturation 93 to 96% on 5 L.  WBC 11.5, hemoglobin 11.5, platelets 203.  Sodium 135, potassium 3.6, bicarbonate 34, serum creatinine 0.52.  LFTs were unremarkable.  MRI of the lumbar spine showed acute to subacute compression fracture with 20% loss in height.  There was 2 to 3 mm bony retropulsion.  It appeared benign and mechanical in appearance.  There is multifactorial L3-L4 moderate spinal stenosis with moderate right and mild left L3 foraminal stenosis.  There is reactive marrow edema bilateral L3-4 facets secondary to facet arthritis.  There is mild spinal stenosis L1-2, L2-3. CT lumbar spine had similar findings.  Neurosurgery, Dr. Conchita Paris was consulted and recommended continue medical therapy, pain control, and outpatient follow-up. The patient significant other stated that he  could no longer take care of the patient because of the patient's progressive debility.  As result the patient was admitted for further evaluation and treatment.   Assessment/Plan: Intractable back pain/increased debility/physical deconditioning Compression fracture L4 -TLSO brace -Judicious opioids--continue IV Dilaudid every 3 hours as needed for now -PT evaluation>>SNF  Acute metabolic Encephalopathy -suspect PRES--wide variations in BP since hospitalization -1/27 CT brain neg -repeat CT brain -EEG -1/28 transfer to SDU -start nicardipine drip -MR brain -7.63/38/58/40  -ammonia 29 -B12 574 -TSH--0.553 -stopped opioids 1/27  Aspiration pneumonitis -1/27 CT chest--patchy GGO LLL -merrem starte 1/27 -speech therapy eval   Urinary retention -Foley catheter inserted 06/08/2023 -UA 11-20WBC -urine culture neg   Pyuria -Follow urine cultures--neg   Tobacco abuse Tobacco cessation discussed Nicoderm patch   Opioid dependence (HCC) PDMP reviewed Oxycodone 10 mg, #90--refilled 06/07/23 Morphine ER 15 mg, #30--filled 06/04/23 Morphine concentrate 100mg /5cc--30 cc--filled 05/31/23, 05/20/23 Xanax 1 mg, #45, filled 06/12/23, 05/19/33   Chronic HFpEF Clinically euvolemic 05/11/2021 echo EF 50-55%, G1DD Distal septal HK Restart carvedilol   COPD (chronic obstructive pulmonary disease) (HCC) As needed bronchodilators   Essential hypertension Restart carvedilol   Dyslipidemia Continue statin   CAD S/P percutaneous coronary angioplasty No chest pain presently Continue Plavix and statin   Hypothyroidism Continue synthroid   Obesity BMI 31.07 Lifestyle modification                   Family Communication:   significant other updated 1/27   Consultants:  none   Code Status:  DNR   DVT Prophylaxis:  [place] Lovenox     Procedures: As Listed in Progress  Note Above   Antibiotics: Merrem 1/27>>      The patient is critically ill with multiple organ systems  failure and requires high complexity decision making for assessment and support, frequent evaluation and titration of therapies, application of advanced monitoring technologies and extensive interpretation of multiple databases.  Critical care time - 40 mins.    Subjective: Pt is awake, follows one step commands.  Denies headache, neck pain, cp, sob, abd pain, n/v  Objective: Vitals:   06/11/23 0803 06/11/23 0806 06/11/23 0810 06/11/23 1429  BP:    (!) 207/96  Pulse:    91  Resp:    18  Temp:    98.4 F (36.9 C)  TempSrc:    Axillary  SpO2: 91% 97% 98% 97%  Weight:      Height:        Intake/Output Summary (Last 24 hours) at 06/11/2023 1503 Last data filed at 06/11/2023 0500 Gross per 24 hour  Intake 0 ml  Output 900 ml  Net -900 ml   Weight change:  Exam:  General:  Pt is alert, follows commands appropriately, not in acute distress HEENT: No icterus, No thrush, No neck mass, [place]/AT Cardiovascular: RRR, S1/S2, no rubs, no gallops Respiratory: bibasilar rales Abdomen: Soft/+BS, non tender, non distended, no guarding Extremities: No edema, No lymphangitis, No petechiae, No rashes, no synovitis Neuro:  CN II-XII intact, strength 4/5 in RUE, 4-/5RLE, strength 4/5 LUE, LLE; sensation intact bilateral; no dysmetria; babinski equivocal    Data Reviewed: I have personally reviewed following labs and imaging studies Basic Metabolic Panel: Recent Labs  Lab 06/08/23 1223 06/09/23 0513 06/10/23 1414 06/11/23 0428  NA 135 135 131* 137  K 4.1 3.6 2.6* 3.3*  CL 92* 93* 87* 98  CO2 34* 34* 31 26  GLUCOSE 105* 101* 127* 123*  BUN 7* 5* 7* 8  CREATININE 0.52 0.48 0.70 0.74  CALCIUM 8.9 9.1 8.9 9.0  MG  --  1.6* 2.2 2.0  PHOS  --  2.7  --   --    Liver Function Tests: Recent Labs  Lab 06/09/23 0513  AST 16  ALT 18  ALKPHOS 96  BILITOT 0.8  PROT 6.2*  ALBUMIN 3.3*   No results for input(s): "LIPASE", "AMYLASE" in the last 168 hours. Recent Labs  Lab 06/10/23 1414   AMMONIA 29   Coagulation Profile: No results for input(s): "INR", "PROTIME" in the last 168 hours. CBC: Recent Labs  Lab 06/08/23 1223 06/09/23 0513 06/10/23 1414 06/11/23 0428  WBC 11.5* 12.6* 18.8* 13.5*  NEUTROABS 9.3*  --   --   --   HGB 11.5* 11.5* 12.2 12.7  HCT 34.4* 35.9* 35.3* 37.5  MCV 86.9 84.5 80.4 82.2  PLT 254 303 400 395   Cardiac Enzymes: No results for input(s): "CKTOTAL", "CKMB", "CKMBINDEX", "TROPONINI" in the last 168 hours. BNP: Invalid input(s): "POCBNP" CBG: No results for input(s): "GLUCAP" in the last 168 hours. HbA1C: No results for input(s): "HGBA1C" in the last 72 hours. Urine analysis:    Component Value Date/Time   COLORURINE STRAW (A) 06/08/2023 1444   APPEARANCEUR CLEAR 06/08/2023 1444   LABSPEC 1.005 06/08/2023 1444   PHURINE 7.0 06/08/2023 1444   GLUCOSEU NEGATIVE 06/08/2023 1444   HGBUR SMALL (A) 06/08/2023 1444   BILIRUBINUR NEGATIVE 06/08/2023 1444   KETONESUR NEGATIVE 06/08/2023 1444   PROTEINUR NEGATIVE 06/08/2023 1444   NITRITE NEGATIVE 06/08/2023 1444   LEUKOCYTESUR MODERATE (A) 06/08/2023 1444   Sepsis Labs: @LABRCNTIP (procalcitonin:4,lacticidven:4) ) Recent  Results (from the past 240 hours)  Urine Culture     Status: None   Collection Time: 06/08/23  7:04 PM   Specimen: Urine, Clean Catch  Result Value Ref Range Status   Specimen Description   Final    URINE, CLEAN CATCH Performed at [facility], [address], [place], [place] [address]    Special Requests   Final    NONE Performed at [facility], [address], [place], [place] [address]    Culture   Final    NO GROWTH Performed at [facility], [address], [place], [place] [address]    Report Status 06/10/2023 FINAL  Final  Culture, blood (Routine X 2) w Reflex to ID Panel     Status: None (Preliminary result)   Collection Time: 06/10/23  2:14 PM   Specimen: BLOOD  Result Value Ref Range Status   Specimen Description BLOOD BLOOD RIGHT ARM   Final   Special Requests   Final    BOTTLES DRAWN AEROBIC AND ANAEROBIC Blood Culture adequate volume   Culture   Final    NO GROWTH < 24 HOURS Performed at [facility], [address], [place], [place] [address]    Report Status PENDING  Incomplete  Culture, blood (Routine X 2) w Reflex to ID Panel     Status: None (Preliminary result)   Collection Time: 06/10/23  2:17 PM   Specimen: BLOOD  Result Value Ref Range Status   Specimen Description BLOOD BLOOD LEFT ARM  Final   Special Requests   Final    Blood Culture adequate volume BOTTLES DRAWN AEROBIC AND ANAEROBIC   Culture   Final    NO GROWTH < 24 HOURS Performed at [facility], [address] [place], [place] [address]    Report Status PENDING  Incomplete     Scheduled Meds:  acetaminophen  1,000 mg Oral Q8H   arformoterol  15 mcg Nebulization BID   atorvastatin  80 mg Oral Daily   budesonide (PULMICORT) nebulizer solution  0.5 mg Nebulization BID   carvedilol  12.5 mg Oral BID WC   Chlorhexidine Gluconate Cloth  6 each Topical Q0600   clopidogrel  75 mg Oral Daily   enoxaparin (LOVENOX) injection  40 mg Subcutaneous Q24H   ipratropium-albuterol  3 mL Nebulization BID   levothyroxine  100 mcg Oral Q0600   losartan  50 mg Oral Daily   nicotine  21 mg Transdermal Daily   pantoprazole  40 mg Oral Daily   potassium chloride  40 mEq Oral Once   Continuous Infusions:  0.9 % NaCl with KCl 40 mEq / L 75 mL/hr at 06/11/23 0639   meropenem (MERREM) IV 1 g (06/11/23 0951)   niCARDipine      Procedures/Studies: CT CHEST WO CONTRAST Result Date: 06/11/2023 CLINICAL DATA:  Respiratory illness EXAM: CT CHEST WITHOUT CONTRAST TECHNIQUE: Multidetector CT imaging of the chest was performed following the standard protocol without IV contrast. RADIATION DOSE REDUCTION: This exam was performed according to the departmental dose-optimization program which includes automated exposure control, adjustment of the mA and/or kV according to  patient size and/or use of iterative reconstruction technique. COMPARISON:  Chest x-ray same day.  CT of the chest 05/23/2020 FINDINGS: Cardiovascular: No significant vascular findings. Normal heart size. No pericardial effusion. 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 There are atherosclerotic calcifications of the aorta and coronary arteries. Mediastinum/Nodes: No enlarged mediastinal or axillary lymph nodes. Thyroid gland, trachea, and  esophagus demonstrate no significant findings. Lungs/Pleura: There are minimal patchy ground-glass opacities in the medial left lower lobe and lingula, likely infectious/inflammatory. Mild emphysema present. The there are few re is a stable peripheral unchanged nodular densities in the left upper lobe measuring up to 4 mm. There is no pleural effusion or pneumothorax. Trachea and central airways are patent. Upper Abdomen: No acute abnormality. Musculoskeletal: No chest wall mass or suspicious bone lesions identified. IMPRESSION: 1. Minimal patchy ground-glass opacities in the medial left lower lobe and lingula, likely infectious/inflammatory. 2. Stable left upper lobe pulmonary nodules measuring up to 4 mm. No follow-up needed if patient is low-risk (and has no known or suspected primary neoplasm). Non-contrast chest CT can be considered in 12 months if patient is high-risk. This recommendation follows the consensus statement: Guidelines for Management of Incidental Pulmonary Nodules Detected on CT Images: From the Fleischner Society 2017; Radiology 2017; 284:228-243. Aortic Atherosclerosis (ICD10-I70.0) and Emphysema (ICD10-J43.9). Electronically Signed   By: Darliss Cheney M.D.   On: 06/11/2023 00:04   CT HEAD WO CONTRAST ( ) Result Date: 06/10/2023 CLINICAL DATA:  Mental status change, unknown cause EXAM: CT HEAD WITHOUT CONTRAST TECHNIQUE: Contiguous axial images were obtained from the base of the skull through the vertex without intravenous contrast.  RADIATION DOSE REDUCTION: This exam was performed according to the departmental dose-optimization program which includes automated exposure control, adjustment of the mA and/or kV according to patient size and/or use of iterative reconstruction technique. COMPARISON:  CT head June 2, 23. FINDINGS: Brain: No evidence of acute infarction, hemorrhage, hydrocephalus, extra-axial collection or mass lesion/mass effect. Vascular: No hyperdense vessel identified. Skull: No acute fracture. Sinuses/Orbits: No acute finding. Other: No mastoid effusions. IMPRESSION: No evidence of acute intracranial abnormality. Electronically Signed   By: Feliberto Harts M.D.   On: 06/10/2023 19:17   DG CHEST PORT 1 VIEW Result Date: 06/10/2023 CLINICAL DATA:  [id number] Acute respiratory failure with hypoxia [facility]) [id number] EXAM: PORTABLE CHEST 1 VIEW COMPARISON:  10/13/2021 FINDINGS: Heart and mediastinal contours are within normal limits. Diffuse interstitial prominence throughout the lungs. No effusions or acute bony abnormality. Aortic atherosclerosis. IMPRESSION: Diffuse interstitial prominence throughout the lungs could reflect chronic interstitial lung disease or atypical infection. Electronically Signed   By: Charlett Nose M.D.   On: 06/10/2023 13:33   MR LUMBAR SPINE WO CONTRAST Result Date: 06/08/2023 CLINICAL DATA:  Initial evaluation for acute compression fracture, lower back pain EXAM: MRI LUMBAR SPINE WITHOUT CONTRAST TECHNIQUE: Multiplanar, multisequence MR imaging of the lumbar spine was performed. No intravenous contrast was administered. COMPARISON:  CT from earlier the same day. FINDINGS: Segmentation:  Examination degraded by motion artifact. Standard segmentation. Lowest well-formed disc space labeled the L5-S1 level. Alignment: Mild straightening of the normal lumbar lordosis with trace dextroscoliosis. No significant listhesis. Vertebrae: Acute to subacute compression fracture involving the superior endplate of L4 with  mild 20% height loss and trace 2-3 mm bony retropulsion. This is benign/mechanical in appearance. Marrow edema at the adjacent inferior endplate of L3 favored to be reactive/degenerative. No convincing fracture seen at this location on prior CT. No other acute or recent fracture. Mild chronic compression deformities involving the superior endplates of T12 and L2 noted. Bone marrow signal intensity mildly heterogeneous but overall within normal limits. No worrisome osseous lesions. Reactive marrow edema present about the bilateral L3-4 facets due to facet arthritis. Postoperative changes from prior lateral screw fixation across the right SI joint noted. Conus medullaris and cauda equina: Conus extends to the L1-2  level. Conus and cauda equina appear normal. Paraspinal and other soft tissues: Mild edema noted within the psoas musculature adjacent to the L4 fracture. Paraspinous soft tissues demonstrate no other acute finding. Asymmetric right renal atrophy with a few scattered T2 hyperintense cysts, largest of which measures 1.5 cm. These are benign in appearance, no follow-up imaging recommended. Subcentimeter simple cyst noted with subcapsular posterior right hepatic lobe, also likely benign. Disc levels: T12-L1: Minimal disc bulge. Mild bilateral facet hypertrophy. No stenosis. L1-2: Degenerative intervertebral disc space narrowing with diffuse disc bulge and disc desiccation. Superimposed irregular right subarticular to extraforaminal disc protrusion (series 6, image 10). Superimposed reactive endplate spurring. Mild facet hypertrophy. Prominence of the dorsal epidural fat. Mild spinal stenosis. Foramina remain patent. L2-3: Degenerative intervertebral disc space narrowing with diffuse disc bulge and reactive endplate spurring, asymmetric to the right. Moderate facet hypertrophy. Resultant mild spinal stenosis. Mild right L2 foraminal narrowing. Left neural foramina remains patent. L3-4: Disc desiccation with  diffuse disc bulge. Possible superimposed right subarticular disc extrusion with superior migration, best seen on axial T2 weighted sequence (series 6, images 22, 21). This is not well seen on corresponding sequences. Severe bilateral facet arthrosis with reactive marrow edema. Trace 2-3 mm bony retropulsion related to the L4 fracture. Mild epidural lipomatosis. Resultant moderate spinal stenosis. Moderate right with mild left L3 foraminal narrowing. L4-5: Advanced degenerative disc space narrowing with disc desiccation and diffuse disc bulge. Reactive endplate spurring. Moderate facet hypertrophy. Resultant mild bilateral subarticular stenosis. Central canal remains patent. Mild right greater than left L4 foraminal stenosis. L5-S1: Disc desiccation with minimal disc bulge. Small central annular fissure. Moderate left greater than right facet hypertrophy. No significant spinal stenosis. Mild left L5 foraminal narrowing. Right neural foramen remains patent. IMPRESSION: 1. Acute to subacute compression fracture involving the superior endplate of L4 with mild 20% height loss and trace 2-3 mm bony retropulsion. This is benign/mechanical in appearance. 2. Multifactorial degenerative changes at L3-4 with resultant moderate spinal stenosis, with moderate right and mild left L3 foraminal narrowing. Possible superimposed right subarticular disc extrusion with superior migration at this level as above, not entirely certain given motion degradation on this exam. 3. Additional multilevel degenerative spondylosis and facet hypertrophy as above. Resultant mild spinal stenosis at L1-2 and L2-3. 4. Reactive marrow edema about the bilateral L3-4 facets due to facet arthritis. Finding could serve as a source for lower back pain. Electronically Signed   By: Rise Mu M.D.   On: 06/08/2023 17:39   CT Lumbar Spine Wo Contrast Result Date: 06/08/2023 CLINICAL DATA:  63 year old female with low back pain, bilateral leg pain.  EXAM: CT LUMBAR SPINE WITHOUT CONTRAST TECHNIQUE: Multidetector CT imaging of the lumbar spine was performed without intravenous contrast administration. Multiplanar CT image reconstructions were also generated. RADIATION DOSE REDUCTION: This exam was performed according to the departmental dose-optimization program which includes automated exposure control, adjustment of the mA and/or kV according to patient size and/or use of iterative reconstruction technique. COMPARISON:  CT Abdomen and Pelvis 10/14/2021. FINDINGS: Segmentation: Normal. Alignment: Straightening of lumbar lordosis not significantly changed from 2023. No significant scoliosis. Mild chronic retrolisthesis of L1 on L2. Vertebrae: L4 superior endplate compression fracture appears unhealed and comminuted. 30% loss of vertebral body height. Prominent new adjacent vacuum disc at L3-L4. Retropulsion of the posterosuperior endplate up to 6 mm. L4 pedicles and posterior elements appear intact, aligned, chronically degenerated. And degenerative appearing bilateral L4-L5 facet ankylosis appears progressed since 2023. Superimposed mildly displaced right L3 transverse  process fracture is new since 2023. Other lumbar vertebrae appear stable since 2023 including mild chronic L2 superior endplate deformity. Underlying osteopenia. Superimposed chronic right SI joint arthrodesis hardware. Visible sacrum and SI joints appears stable. Paraspinal and other soft tissues: Aortoiliac calcified atherosclerosis. Chronic right renal atrophy. Noncontrast visible abdominal viscera appear stable. Distended urinary bladder. Diverticulosis of the distal large bowel. Disc levels: Stable CT appearance of chronic lumbar spine degeneration since 2023 except L3-L4: Progressed vacuum disc. Posttraumatic mild retropulsion of the L4 posterosuperior endplate and increased multifactorial moderate to severe spinal stenosis at that level (series 4, image 75 now versus series 3, image 38  there in 2023. Superimposed chronic bulky facet arthropathy. Chronic moderate to severe bilateral L3 foraminal stenosis has not significantly changed. IMPRESSION: 1. Acute to subacute: - L4 compression fracture with 30% loss of vertebral body height, mild retropulsion, and subsequent increased moderate to severe multifactorial spinal stenosis at L3-L4. Chronic L3 foraminal stenosis not significantly changed. - right L3 transverse process fracture. 2. Other chronic lumbar spine degeneration otherwise appears stable by CT since 2023. 3. Distended urinary bladder, query Urinary Retention. 4. Aortic Atherosclerosis (ICD10-I70.0). Large bowel diverticulosis. Chronic right renal atrophy. Electronically Signed   By: Odessa Fleming M.D.   On: 06/08/2023 13:01    Catarina Hartshorn, DO  [facility]  If 7PM-7AM, please contact night-coverage [email or web address] Password [email or web address] 06/11/2023, 3:03 PM   LOS: 3 days

## 2023-06-11 NOTE — TOC Progression Note (Signed)
Transition of Care [facility]) - Progression Note    Patient Details  Name: Jessica Bell MRN: [id number] Date of Birth: 11-03-1960  Transition of Care [facility]) CM/SW Contact  [place], [place] Phone Number: 06/11/2023, 4:02 PM  Clinical Narrative:    CSW updated by Lanice Schwab with [facility] that pt/family have revoked hospice services at this time. CSW to follow up with pt and family tomorrow for bed choice. TOC to follow.   Expected Discharge Plan: Skilled Nursing Facility Barriers to Discharge: Continued Medical Work up  Expected Discharge Plan and Services In-house Referral: Clinical Social Work Discharge Planning Services: CM Consult Post Acute Care Choice: Skilled Nursing Facility Living arrangements for the past 2 months: Single Family Home                                       Social Determinants of Health (SDOH) Interventions SDOH Screenings   Food Insecurity: No Food Insecurity (06/08/2023)  Housing: Low Risk  (06/08/2023)  Transportation Needs: No Transportation Needs (06/08/2023)  Utilities: Not At Risk (06/08/2023)  Financial Resource Strain: Low Risk  (09/05/2021)   Received from [facility], [facility], [facility]  Physical Activity: Inactive (09/05/2021)   [facility], [facility]  Social Connections: Moderately Isolated (06/08/2023)  Stress: Stress Concern Present (09/05/2021)   Received from [facility], [facility], [facility]  Tobacco Use: High Risk (06/08/2023)    Readmission Risk Interventions    06/10/2023   11:38 AM 10/16/2021   11:41 AM 08/24/2021   11:58 AM  Readmission Risk Prevention Plan  Transportation Screening Complete Complete Complete  Home Care Screening Complete    Medication Review (RN CM) Complete    Medication Review [employer])  Complete Complete  HRI or Home Care Consult  Complete Complete  SW Recovery Care/Counseling Consult  Complete Complete  Palliative Care Screening  Not  Applicable Not Applicable  Skilled Nursing Facility  Not Applicable Not Applicable

## 2023-06-11 NOTE — Progress Notes (Addendum)
EEG complete - results pending

## 2023-06-11 NOTE — Plan of Care (Signed)

## 2023-06-11 NOTE — Plan of Care (Signed)
Problem: Activity: Goal: Risk for activity intolerance will decrease Outcome: Progressing   Problem: Nutrition: Goal: Adequate nutrition will be maintained Outcome: Progressing   Problem: Elimination: Goal: Will not experience complications related to bowel motility Outcome: Progressing Goal: Will not experience complications related to urinary retention Outcome: Progressing

## 2023-06-11 NOTE — Progress Notes (Addendum)
Arrived to room RN in the middle of IV asked for a few moments. Will try back for EEG as schedule permits.

## 2023-06-11 NOTE — Procedures (Addendum)
Patient Name: NJERI VICENTE  MRN: [id number]  Epilepsy Attending: Charlsie Quest  Referring Physician/Provider: Catarina Hartshorn, MD  Date: 06/11/2023 Duration: 24.03 mins  Patient history: 63yo F with ams. EEG to evaluate for seizure.   Level of alertness: Awake   AEDs during EEG study: None   Technical aspects: This EEG study was done with scalp electrodes positioned according to the 10-20 International system of electrode placement. Electrical activity was acquired at a sampling rate of 500Hz  and reviewed with a high frequency filter of 70Hz  and a low frequency filter of 1Hz . EEG data were recorded continuously and digitally stored.    Description: The posterior dominant rhythm consists of 6 Hz activity of moderate voltage (25-35 uV) seen predominantly in posterior head regions, symmetric and reactive to eye opening and eye closing. EEG showed continuous generalized 5 to 6 Hz theta slowing. Hyperventilation and photic stimulation were not performed.      ABNORMALITY - Continuous slow, generalized   IMPRESSION: This study is suggestive of moderate diffuse encephalopathy.. No seizures or epileptiform discharges were seen throughout the recording.   Othell Diluzio Annabelle Harman

## 2023-06-12 ENCOUNTER — Inpatient Hospital Stay (HOSPITAL_COMMUNITY): Payer: Medicare Other

## 2023-06-12 DIAGNOSIS — S32040G Wedge compression fracture of fourth lumbar vertebra, subsequent encounter for fracture with delayed healing: Secondary | ICD-10-CM

## 2023-06-12 DIAGNOSIS — G928 Other toxic encephalopathy: Secondary | ICD-10-CM

## 2023-06-12 DIAGNOSIS — G9340 Encephalopathy, unspecified: Secondary | ICD-10-CM

## 2023-06-12 DIAGNOSIS — I10 Essential (primary) hypertension: Secondary | ICD-10-CM | POA: Diagnosis not present

## 2023-06-12 LAB — CBC
HCT: 39.4 % (ref 36.0–46.0)
Hemoglobin: 13.4 g/dL (ref 12.0–15.0)
MCH: 27.8 pg (ref 26.0–34.0)
MCHC: 34 g/dL (ref 30.0–36.0)
MCV: 81.7 fL (ref 80.0–100.0)
Platelets: 372 10*3/uL (ref 150–400)
RBC: 4.82 MIL/uL (ref 3.87–5.11)
RDW: 14.7 % (ref 11.5–15.5)
WBC: 18.8 10*3/uL — ABNORMAL HIGH (ref 4.0–10.5)
nRBC: 0 % (ref 0.0–0.2)

## 2023-06-12 LAB — BASIC METABOLIC PANEL
Anion gap: 13 (ref 5–15)
BUN: 6 mg/dL — ABNORMAL LOW (ref 8–23)
CO2: 21 mmol/L — ABNORMAL LOW (ref 22–32)
Calcium: 8.9 mg/dL (ref 8.9–10.3)
Chloride: 104 mmol/L (ref 98–111)
Creatinine, Ser: 0.64 mg/dL (ref 0.44–1.00)
GFR, Estimated: >60 mL/min (ref >60)
Glucose, Bld: 136 mg/dL — ABNORMAL HIGH (ref 70–99)
Potassium: 3.1 mmol/L — ABNORMAL LOW (ref 3.5–5.1)
Sodium: 138 mmol/L (ref 135–145)

## 2023-06-12 LAB — PROCALCITONIN: Procalcitonin: 2.25 ng/mL

## 2023-06-12 LAB — MAGNESIUM: Magnesium: 1.6 mg/dL — ABNORMAL LOW (ref 1.7–2.4)

## 2023-06-12 LAB — LACTIC ACID, PLASMA: Lactic Acid, Venous: 1.3 mmol/L (ref 0.5–1.9)

## 2023-06-12 MED ORDER — POTASSIUM CHLORIDE 10 MEQ/100ML IV SOLN
10.0000 meq | INTRAVENOUS | Status: AC
  Administered 2023-06-12 (×2): 10 meq via INTRAVENOUS
  Filled 2023-06-12 (×2): qty 100

## 2023-06-12 MED ORDER — RISAQUAD PO CAPS
2.0000 | ORAL_CAPSULE | Freq: Three times a day (TID) | ORAL | Status: DC
  Administered 2023-06-13 – 2023-06-16 (×10): 2 via ORAL
  Filled 2023-06-12 (×13): qty 2

## 2023-06-12 MED ORDER — LORAZEPAM 2 MG/ML IJ SOLN
0.5000 mg | Freq: Once | INTRAMUSCULAR | Status: AC
  Administered 2023-06-12: 0.5 mg via INTRAVENOUS
  Filled 2023-06-12: qty 1

## 2023-06-12 MED ORDER — MAGNESIUM SULFATE 2 GM/50ML IV SOLN
2.0000 g | Freq: Once | INTRAVENOUS | Status: AC
  Administered 2023-06-12: 2 g via INTRAVENOUS
  Filled 2023-06-12: qty 50

## 2023-06-12 MED ORDER — CARVEDILOL 12.5 MG PO TABS
25.0000 mg | ORAL_TABLET | Freq: Two times a day (BID) | ORAL | Status: DC
  Administered 2023-06-12 – 2023-06-13 (×2): 25 mg via ORAL
  Filled 2023-06-12 (×3): qty 2

## 2023-06-12 MED ORDER — POTASSIUM CHLORIDE IN NACL 40-0.9 MEQ/L-% IV SOLN: INTRAVENOUS | Status: DC

## 2023-06-12 MED ORDER — POTASSIUM CHLORIDE 10 MEQ/100ML IV SOLN: 10.0000 meq | INTRAVENOUS | Status: DC

## 2023-06-12 MED ORDER — ENSURE ENLIVE PO LIQD
237.0000 mL | Freq: Two times a day (BID) | ORAL | Status: DC
  Administered 2023-06-14 (×2): 237 mL via ORAL

## 2023-06-12 MED ORDER — SODIUM CHLORIDE 0.9 % IV SOLN
2.0000 g | Freq: Three times a day (TID) | INTRAVENOUS | Status: DC
  Administered 2023-06-12 – 2023-06-14 (×6): 2 g via INTRAVENOUS
  Filled 2023-06-12 (×6): qty 12.5

## 2023-06-12 MED ORDER — ORAL CARE MOUTH RINSE: 15.0000 mL | OROMUCOSAL | Status: DC | PRN

## 2023-06-12 MED ORDER — GABAPENTIN 100 MG PO CAPS
100.0000 mg | ORAL_CAPSULE | Freq: Three times a day (TID) | ORAL | Status: DC
  Administered 2023-06-12 – 2023-06-16 (×11): 100 mg via ORAL
  Filled 2023-06-12 (×12): qty 1

## 2023-06-12 MED ORDER — PANTOPRAZOLE SODIUM 40 MG IV SOLR
40.0000 mg | Freq: Every day | INTRAVENOUS | Status: DC
  Administered 2023-06-12 – 2023-06-13 (×2): 40 mg via INTRAVENOUS
  Filled 2023-06-12 (×2): qty 10

## 2023-06-12 MED ORDER — ALPRAZOLAM 1 MG PO TABS
1.0000 mg | ORAL_TABLET | Freq: Three times a day (TID) | ORAL | Status: DC | PRN
  Administered 2023-06-13 – 2023-06-16 (×9): 1 mg via ORAL
  Filled 2023-06-12 (×9): qty 1

## 2023-06-12 NOTE — Plan of Care (Signed)
Problem: Health Behavior/Discharge Planning: Goal: Ability to manage health-related needs will improve Outcome: Progressing   Problem: Pain Managment: Goal: General experience of comfort will improve and/or be controlled Outcome: Progressing   Problem: Safety: Goal: Ability to remain free from injury will improve Outcome: Progressing   Problem: Skin Integrity: Goal: Risk for impaired skin integrity will decrease Outcome: Progressing

## 2023-06-12 NOTE — Progress Notes (Signed)
PT Cancellation Note  Patient Details Name: Jessica Bell MRN: [id number] DOB: Sep 06, 1960   Cancelled Treatment:    Reason Eval/Treat Not Completed: Medical issues which prohibited therapy.  Patient transferred to a higher level of care and will need new PT consult to resume therapy when patient is medically stable.  Thank you.    11:44 AM, 06/12/23 Ocie Bob, MPT Physical Therapist with [facility] [phone] office [phone] mobile phone

## 2023-06-12 NOTE — Progress Notes (Signed)
Patient more alert today, spoke some when daughter, Burnett Harry arrived but then went back to minimum talking (only yes/no). After one time dose IV ativan given, patient alert but just stared off and didn't interact much. Patient did, however, take the evening dose of gabapentin and coreg with A LOT of encouragement but would not take the Risaquad. Patient did state earlier that she loves ginger ale so medication was given with sips of ginger ale. Dr Flossie Dibble made aware of patient taking meds and refusal of taking anymore at this time.

## 2023-06-12 NOTE — Progress Notes (Signed)
PROGRESS NOTE  Jessica Bell WGN:[id number] DOB: [birth year] DOA: 06/08/2023 PCP: Pcp, No  Brief History:  63 year old female with a history of COPD, chronic respiratory failure on 5 L, tobacco abuse, HFpEF, hypertension, PRES, hyperlipidemia, opioid dependence, coronary disease, GERD, hypothyroidism presenting with worsening back pain radiating to bilateral legs.  The patient sustained a mechanical fall about 2 weeks prior to admission.  Since that period time, the patient has had increasing pain.  At baseline, the patient has been ambulating with a walker.  However because of the pain she is having more difficulty.  She is only be able to take a few steps.  Her significant other is having difficulty caring for her.  As result, she was brought to the emergency department for further evaluation and treatment.  The patient denies any fevers, chills, headache, chest pain, shortness breath, hemoptysis, nausea, vomiting, diarrhea, abdominal pain. Notably, the patient administers her own medications.  She states that she has not been 100% compliant. In the ED, the patient was afebrile hemodynamically stable with oxygen saturation 93 to 96% on 5 L.  WBC 11.5, hemoglobin 11.5, platelets 203.  Sodium 135, potassium 3.6, bicarbonate 34, serum creatinine 0.52.  LFTs were unremarkable.  MRI of the lumbar spine showed acute to subacute compression fracture with 20% loss in height.  There was 2 to 3 mm bony retropulsion.  It appeared benign and mechanical in appearance.  There is multifactorial L3-L4 moderate spinal stenosis with moderate right and mild left L3 foraminal stenosis.  There is reactive marrow edema bilateral L3-4 facets secondary to facet arthritis.  There is mild spinal stenosis L1-2, L2-3. CT lumbar spine had similar findings.  Neurosurgery, Dr. Conchita Paris was consulted and recommended continue medical therapy, pain control, and outpatient follow-up. The patient significant other stated that he  could no longer take care of the patient because of the patient's progressive debility.  As result the patient was admitted for further evaluation and treatment.  ======================================================================        Subjective: The patient was seen and examined this morning, more awake per nursing staff, but remained to be agitated, confused. Per nursing staff did not take her p.o. medications has not had any adequate p.o. today    Assessment/Plan:  Acute metabolic Encephalopathy -suspect PRES--wide variations in BP since hospitalization -1/27 CT brain neg -repeat CT brain -EEG -no seizure activity -Continue with neurochecks  -1/28 transfer to ICU   -start Nicardipine drip -for elevated and fluctuating BP  -11/29 MR brain>>>   -7.63/38/58/40  -ammonia 29 -B12 574 -TSH--0.553 -stopped opioids 1/27   Intractable back pain/increased debility/physical deconditioning Compression fracture L4 -TLSO brace -Judicious opioids--continue IV Dilaudid every 3 hours as needed  We need to adjust narcotics for better mental status evaluation  -PT evaluation>>SNF  Aspiration pneumonitis -Afebrile, no leukocytosis -1/27 CT chest--patchy GGO LLL -merrem starte 1/27 -speech therapy eval   Hypokalemia  -Repleting with IV -Monitoring checking magnesium    urinary retention -Foley catheter inserted 06/08/2023-continue for 1 more day -UA 11-20WBC -urine culture neg   Pyuria -Follow urine cultures--neg   Tobacco abuse Tobacco cessation discussed Nicoderm patch   Opioid dependence (HCC) PDMP reviewed Oxycodone 10 mg, #90--refilled 06/07/23 Morphine ER 15 mg, #30--filled 06/04/23 Morphine concentrate 100mg /5cc--30 cc--filled 05/31/23, 05/20/23 Xanax 1 mg, #45, filled 06/12/23, 05/19/33   Chronic HFpEF Clinically euvolemic 05/11/2021 echo EF 50-55%, G1DD Distal septal HK Restart carvedilol-increasing dose   COPD (chronic obstructive pulmonary  disease) (HCC) As needed bronchodilators   Essential hypertension Restart carvedilol -increasing dose, on nicardipine drip   Dyslipidemia Continue statin   CAD S/P percutaneous coronary angioplasty No chest pain presently Continue Plavix and statin   Hypothyroidism Continue synthroid   Obesity BMI 31.07 Lifestyle modification                   Family Communication:   significant other updated 1/27   Consultants:  none   Code Status:  DNR   DVT Prophylaxis:  [place] Lovenox     Procedures: As Listed in Progress Note Above   Antibiotics: Merrem 1/27>>     Objective: Vitals:   06/12/23 0830 06/12/23 0845 06/12/23 0900 06/12/23 0915  BP: (!) 168/67 (!) 158/74 (!) 169/75 (!) 173/77  Pulse: (!) 103 (!) 107 (!) 108 (!) 115  Resp:      Temp:      TempSrc:      SpO2: 98% 97% 97% 98%  Weight:      Height:        Intake/Output Summary (Last 24 hours) at 06/12/2023 1104 Last data filed at 06/12/2023 0501 Gross per 24 hour  Intake 3245.41 ml  Output 2950 ml  Net 295.41 ml   Weight change:          General:  Confused - agitated  HEENT:  Normocephalic, PERRL, otherwise with in Normal limits   Neuro:  Limited exam, confused,  CNII-XII intact. , normal motor and sensation, reflexes intact   Lungs:   Clear to auscultation BL, Respirations unlabored,  No wheezes / crackles  Cardio:    S1/S2, RRR, No murmure, No Rubs or Gallops   Abdomen:  Soft, non-tender, bowel sounds active all four quadrants, no guarding or peritoneal signs.  Muscular  skeletal:  Limited exam -global generalized weaknesses - in bed, able to move all 4 extremities,   2+ pulses,  symmetric, No pitting edema  Skin:  Dry, warm to touch, negative for any Rashes,  Wounds: Please see nursing documentation          I have personally reviewed following labs and imaging studies Basic Metabolic Panel: Recent Labs  Lab 06/08/23 1223 06/09/23 0513 06/10/23 1414 06/11/23 0428  06/12/23 0513  NA 135 135 131* 137 138  K 4.1 3.6 2.6* 3.3* 3.1*  CL 92* 93* 87* 98 104  CO2 34* 34* 31 26 21*  GLUCOSE 105* 101* 127* 123* 136*  BUN 7* 5* 7* 8 6*  CREATININE 0.52 0.48 0.70 0.74 0.64  CALCIUM 8.9 9.1 8.9 9.0 8.9  MG  --  1.6* 2.2 2.0 1.6*  PHOS  --  2.7  --   --   --    Liver Function Tests: Recent Labs  Lab 06/09/23 0513  AST 16  ALT 18  ALKPHOS 96  BILITOT 0.8  PROT 6.2*  ALBUMIN 3.3*   No results for input(s): "LIPASE", "AMYLASE" in the last 168 hours. Recent Labs  Lab 06/10/23 1414  AMMONIA 29   Coagulation Profile: No results for input(s): "INR", "PROTIME" in the last 168 hours. CBC: Recent Labs  Lab 06/08/23 1223 06/09/23 0513 06/10/23 1414 06/11/23 0428 06/12/23 0513  WBC 11.5* 12.6* 18.8* 13.5* 18.8*  NEUTROABS 9.3*  --   --   --   --   HGB 11.5* 11.5* 12.2 12.7 13.4  HCT 34.4* 35.9* 35.3* 37.5 39.4  MCV 86.9 84.5 80.4 82.2 81.7  PLT 254 303 400 395 372   Cardiac Enzymes:  No results for input(s): "CKTOTAL", "CKMB", "CKMBINDEX", "TROPONINI" in the last 168 hours. BNP: Invalid input(s): "POCBNP" CBG: Recent Labs  Lab 06/11/23 1606  GLUCAP 111*   HbA1C: No results for input(s): "HGBA1C" in the last 72 hours. Urine analysis:    Component Value Date/Time   COLORURINE STRAW (A) 06/08/2023 1444   APPEARANCEUR CLEAR 06/08/2023 1444   LABSPEC 1.005 06/08/2023 1444   PHURINE 7.0 06/08/2023 1444   GLUCOSEU NEGATIVE 06/08/2023 1444   HGBUR SMALL (A) 06/08/2023 1444   BILIRUBINUR NEGATIVE 06/08/2023 1444   KETONESUR NEGATIVE 06/08/2023 1444   PROTEINUR NEGATIVE 06/08/2023 1444   NITRITE NEGATIVE 06/08/2023 1444   LEUKOCYTESUR MODERATE (A) 06/08/2023 1444   Sepsis Labs: @LABRCNTIP (procalcitonin:4,lacticidven:4) ) Recent Results (from the past 240 hours)  Urine Culture     Status: None   Collection Time: 06/08/23  7:04 PM   Specimen: Urine, Clean Catch  Result Value Ref Range Status   Specimen Description   Final    URINE,  CLEAN CATCH Performed at [facility], [address] [place], [place] [address]    Special Requests   Final    NONE Performed at [facility], [address] [place], [place] [address]    Culture   Final    NO GROWTH Performed at [facility], [address], [place], [place] [address]    Report Status 06/10/2023 FINAL  Final  Culture, blood (Routine X 2) w Reflex to ID Panel     Status: None (Preliminary result)   Collection Time: 06/10/23  2:14 PM   Specimen: BLOOD  Result Value Ref Range Status   Specimen Description BLOOD BLOOD RIGHT ARM  Final   Special Requests   Final    BOTTLES DRAWN AEROBIC AND ANAEROBIC Blood Culture adequate volume   Culture   Final    NO GROWTH 2 DAYS Performed at [facility], [address], [place], [place] [address]    Report Status PENDING  Incomplete  Culture, blood (Routine X 2) w Reflex to ID Panel     Status: None (Preliminary result)   Collection Time: 06/10/23  2:17 PM   Specimen: BLOOD  Result Value Ref Range Status   Specimen Description BLOOD BLOOD LEFT ARM  Final   Special Requests   Final    Blood Culture adequate volume BOTTLES DRAWN AEROBIC AND ANAEROBIC   Culture   Final    NO GROWTH 2 DAYS Performed at [facility], [address] [place], [place] [address]    Report Status PENDING  Incomplete  MRSA Next Gen by PCR, Nasal     Status: None   Collection Time: 06/11/23  3:42 PM   Specimen: Nasal Mucosa; Nasal Swab  Result Value Ref Range Status   MRSA by PCR Next Gen NOT DETECTED NOT DETECTED Final    Comment: (NOTE) The GeneXpert MRSA Assay (FDA approved for NASAL specimens only), is one component of a comprehensive MRSA colonization surveillance program. It is not intended to diagnose MRSA infection nor to guide or monitor treatment for MRSA infections. Test performance is not FDA approved in patients less than 68 years old. Performed at [facility], [address] [place], [place] [address]      Scheduled  Meds:  acetaminophen  1,000 mg Oral Q8H   acidophilus  2 capsule Oral TID   arformoterol  15 mcg Nebulization BID   atorvastatin  80 mg Oral Daily   budesonide (PULMICORT) nebulizer solution  0.5 mg Nebulization BID  carvedilol  12.5 mg Oral BID WC   Chlorhexidine Gluconate Cloth  6 each Topical Q0600   clopidogrel  75 mg Oral Daily   ipratropium-albuterol  3 mL Nebulization BID   levothyroxine  100 mcg Oral Q0600   losartan  50 mg Oral Daily   nicotine  21 mg Transdermal Daily   pantoprazole (PROTONIX) IV  40 mg Intravenous Daily   Continuous Infusions:  0.9 % NaCl with KCl 40 mEq / L     cefTRIAXone (ROCEPHIN)  IV Stopped (06/11/23 2148)   metronidazole 500 mg (06/12/23 0919)   niCARDipine 5 mg/hr (06/12/23 0741)   potassium chloride 10 mEq (06/12/23 0916)    Procedures/Studies: EEG adult Result Date: 06/11/2023 Charlsie Quest, MD     06/11/2023  5:25 PM Patient Name: Jessica Bell MRN: [id number] Epilepsy Attending: Charlsie Quest Referring Physician/Provider: Catarina Hartshorn, MD Date: 06/11/2023 Duration: 24.03 mins Patient history: 63yo F with ams. EEG to evaluate for seizure.  Level of alertness: Awake  AEDs during EEG study: None  Technical aspects: This EEG study was done with scalp electrodes positioned according to the 10-20 International system of electrode placement. Electrical activity was acquired at a sampling rate of 500Hz  and reviewed with a high frequency filter of 70Hz  and a low frequency filter of 1Hz . EEG data were recorded continuously and digitally stored.  Description: The posterior dominant rhythm consists of 6 Hz activity of moderate voltage (25-35 uV) seen predominantly in posterior head regions, symmetric and reactive to eye opening and eye closing. EEG showed continuous generalized 5 to 6 Hz theta slowing. Hyperventilation and photic stimulation were not performed.    ABNORMALITY - Continuous slow, generalized  IMPRESSION: This study is suggestive of moderate  diffuse encephalopathy.. No seizures or epileptiform discharges were seen throughout the recording.  Priyanka Annabelle Harman   CT HEAD WO CONTRAST ( ) Result Date: 06/11/2023 CLINICAL DATA:  Mental status change, unknown cause. New right gaze preference. Hypertension with systolic blood pressure greater than 200. History of PRES. EXAM: CT HEAD WITHOUT CONTRAST TECHNIQUE: Contiguous axial images were obtained from the base of the skull through the vertex without intravenous contrast. RADIATION DOSE REDUCTION: This exam was performed according to the departmental dose-optimization program which includes automated exposure control, adjustment of the mA and/or kV according to patient size and/or use of iterative reconstruction technique. COMPARISON:  Head CT 06/10/2023 FINDINGS: Brain: There are new moderate-sized regions of hypodensity in the right occipital and right parietal lobes most resembling vasogenic edema with milder edema in the left occipital lobe. There is also mild edema in the right and possibly left cerebellar hemispheres. No acute intracranial hemorrhage, midline shift, or extra-axial fluid collection is identified. The ventricles are normal in size. Vascular: No hyperdense vessel. Skull: No acute fracture or suspicious osseous lesion. Sinuses/Orbits: Mucosal thickening and small volume fluid in the left sphenoid sinus, unchanged. Clear mastoid air cells. Unremarkable orbits. Other: None. IMPRESSION: New multifocal edema involving the right greater than left posterior cerebral hemispheres and cerebellum most suggestive of recurrent PRES. Brain MRI with and without contrast is recommended for further evaluation. Electronically Signed   By: Sebastian Ache M.D.   On: 06/11/2023 15:56   CT CHEST WO CONTRAST Result Date: 06/11/2023 CLINICAL DATA:  Respiratory illness EXAM: CT CHEST WITHOUT CONTRAST TECHNIQUE: Multidetector CT imaging of the chest was performed following the standard protocol without IV  contrast. RADIATION DOSE REDUCTION: This exam was performed according to the departmental dose-optimization program which includes automated  exposure control, adjustment of the mA and/or kV according to patient size and/or use of iterative reconstruction technique. COMPARISON:  Chest x-ray same day.  CT of the chest 05/23/2020 FINDINGS: Cardiovascular: No significant vascular findings. Normal heart size. No pericardial effusion. 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 There are atherosclerotic calcifications of the aorta and coronary arteries. Mediastinum/Nodes: No enlarged mediastinal or axillary lymph nodes. Thyroid gland, trachea, and esophagus demonstrate no significant findings. Lungs/Pleura: There are minimal patchy ground-glass opacities in the medial left lower lobe and lingula, likely infectious/inflammatory. Mild emphysema present. The there are few re is a stable peripheral unchanged nodular densities in the left upper lobe measuring up to 4 mm. There is no pleural effusion or pneumothorax. Trachea and central airways are patent. Upper Abdomen: No acute abnormality. Musculoskeletal: No chest wall mass or suspicious bone lesions identified. IMPRESSION: 1. Minimal patchy ground-glass opacities in the medial left lower lobe and lingula, likely infectious/inflammatory. 2. Stable left upper lobe pulmonary nodules measuring up to 4 mm. No follow-up needed if patient is low-risk (and has no known or suspected primary neoplasm). Non-contrast chest CT can be considered in 12 months if patient is high-risk. This recommendation follows the consensus statement: Guidelines for Management of Incidental Pulmonary Nodules Detected on CT Images: From the Fleischner Society 2017; Radiology 2017; 284:228-243. Aortic Atherosclerosis (ICD10-I70.0) and Emphysema (ICD10-J43.9). Electronically Signed   By: Darliss Cheney M.D.   On: 06/11/2023 00:04   CT HEAD WO CONTRAST ( ) Result Date:  06/10/2023 CLINICAL DATA:  Mental status change, unknown cause EXAM: CT HEAD WITHOUT CONTRAST TECHNIQUE: Contiguous axial images were obtained from the base of the skull through the vertex without intravenous contrast. RADIATION DOSE REDUCTION: This exam was performed according to the departmental dose-optimization program which includes automated exposure control, adjustment of the mA and/or kV according to patient size and/or use of iterative reconstruction technique. COMPARISON:  CT head June 2, 23. FINDINGS: Brain: No evidence of acute infarction, hemorrhage, hydrocephalus, extra-axial collection or mass lesion/mass effect. Vascular: No hyperdense vessel identified. Skull: No acute fracture. Sinuses/Orbits: No acute finding. Other: No mastoid effusions. IMPRESSION: No evidence of acute intracranial abnormality. Electronically Signed   By: Feliberto Harts M.D.   On: 06/10/2023 19:17   DG CHEST PORT 1 VIEW Result Date: 06/10/2023 CLINICAL DATA:  [id number] Acute respiratory failure with hypoxia [facility]) [id number] EXAM: PORTABLE CHEST 1 VIEW COMPARISON:  10/13/2021 FINDINGS: Heart and mediastinal contours are within normal limits. Diffuse interstitial prominence throughout the lungs. No effusions or acute bony abnormality. Aortic atherosclerosis. IMPRESSION: Diffuse interstitial prominence throughout the lungs could reflect chronic interstitial lung disease or atypical infection. Electronically Signed   By: Charlett Nose M.D.   On: 06/10/2023 13:33   MR LUMBAR SPINE WO CONTRAST Result Date: 06/08/2023 CLINICAL DATA:  Initial evaluation for acute compression fracture, lower back pain EXAM: MRI LUMBAR SPINE WITHOUT CONTRAST TECHNIQUE: Multiplanar, multisequence MR imaging of the lumbar spine was performed. No intravenous contrast was administered. COMPARISON:  CT from earlier the same day. FINDINGS: Segmentation:  Examination degraded by motion artifact. Standard segmentation. Lowest well-formed disc space labeled the  L5-S1 level. Alignment: Mild straightening of the normal lumbar lordosis with trace dextroscoliosis. No significant listhesis. Vertebrae: Acute to subacute compression fracture involving the superior endplate of L4 with mild 20% height loss and trace 2-3 mm bony retropulsion. This is benign/mechanical in appearance. Marrow edema at the adjacent inferior endplate of L3 favored to be reactive/degenerative. No convincing fracture seen at this location on  prior CT. No other acute or recent fracture. Mild chronic compression deformities involving the superior endplates of T12 and L2 noted. Bone marrow signal intensity mildly heterogeneous but overall within normal limits. No worrisome osseous lesions. Reactive marrow edema present about the bilateral L3-4 facets due to facet arthritis. Postoperative changes from prior lateral screw fixation across the right SI joint noted. Conus medullaris and cauda equina: Conus extends to the L1-2 level. Conus and cauda equina appear normal. Paraspinal and other soft tissues: Mild edema noted within the psoas musculature adjacent to the L4 fracture. Paraspinous soft tissues demonstrate no other acute finding. Asymmetric right renal atrophy with a few scattered T2 hyperintense cysts, largest of which measures 1.5 cm. These are benign in appearance, no follow-up imaging recommended. Subcentimeter simple cyst noted with subcapsular posterior right hepatic lobe, also likely benign. Disc levels: T12-L1: Minimal disc bulge. Mild bilateral facet hypertrophy. No stenosis. L1-2: Degenerative intervertebral disc space narrowing with diffuse disc bulge and disc desiccation. Superimposed irregular right subarticular to extraforaminal disc protrusion (series 6, image 10). Superimposed reactive endplate spurring. Mild facet hypertrophy. Prominence of the dorsal epidural fat. Mild spinal stenosis. Foramina remain patent. L2-3: Degenerative intervertebral disc space narrowing with diffuse disc bulge  and reactive endplate spurring, asymmetric to the right. Moderate facet hypertrophy. Resultant mild spinal stenosis. Mild right L2 foraminal narrowing. Left neural foramina remains patent. L3-4: Disc desiccation with diffuse disc bulge. Possible superimposed right subarticular disc extrusion with superior migration, best seen on axial T2 weighted sequence (series 6, images 22, 21). This is not well seen on corresponding sequences. Severe bilateral facet arthrosis with reactive marrow edema. Trace 2-3 mm bony retropulsion related to the L4 fracture. Mild epidural lipomatosis. Resultant moderate spinal stenosis. Moderate right with mild left L3 foraminal narrowing. L4-5: Advanced degenerative disc space narrowing with disc desiccation and diffuse disc bulge. Reactive endplate spurring. Moderate facet hypertrophy. Resultant mild bilateral subarticular stenosis. Central canal remains patent. Mild right greater than left L4 foraminal stenosis. L5-S1: Disc desiccation with minimal disc bulge. Small central annular fissure. Moderate left greater than right facet hypertrophy. No significant spinal stenosis. Mild left L5 foraminal narrowing. Right neural foramen remains patent. IMPRESSION: 1. Acute to subacute compression fracture involving the superior endplate of L4 with mild 20% height loss and trace 2-3 mm bony retropulsion. This is benign/mechanical in appearance. 2. Multifactorial degenerative changes at L3-4 with resultant moderate spinal stenosis, with moderate right and mild left L3 foraminal narrowing. Possible superimposed right subarticular disc extrusion with superior migration at this level as above, not entirely certain given motion degradation on this exam. 3. Additional multilevel degenerative spondylosis and facet hypertrophy as above. Resultant mild spinal stenosis at L1-2 and L2-3. 4. Reactive marrow edema about the bilateral L3-4 facets due to facet arthritis. Finding could serve as a source for lower  back pain. Electronically Signed   By: Rise Mu M.D.   On: 06/08/2023 17:39   CT Lumbar Spine Wo Contrast Result Date: 06/08/2023 CLINICAL DATA:  63 year old female with low back pain, bilateral leg pain. EXAM: CT LUMBAR SPINE WITHOUT CONTRAST TECHNIQUE: Multidetector CT imaging of the lumbar spine was performed without intravenous contrast administration. Multiplanar CT image reconstructions were also generated. RADIATION DOSE REDUCTION: This exam was performed according to the departmental dose-optimization program which includes automated exposure control, adjustment of the mA and/or kV according to patient size and/or use of iterative reconstruction technique. COMPARISON:  CT Abdomen and Pelvis 10/14/2021. FINDINGS: Segmentation: Normal. Alignment: Straightening of lumbar lordosis not significantly  changed from 2023. No significant scoliosis. Mild chronic retrolisthesis of L1 on L2. Vertebrae: L4 superior endplate compression fracture appears unhealed and comminuted. 30% loss of vertebral body height. Prominent new adjacent vacuum disc at L3-L4. Retropulsion of the posterosuperior endplate up to 6 mm. L4 pedicles and posterior elements appear intact, aligned, chronically degenerated. And degenerative appearing bilateral L4-L5 facet ankylosis appears progressed since 2023. Superimposed mildly displaced right L3 transverse process fracture is new since 2023. Other lumbar vertebrae appear stable since 2023 including mild chronic L2 superior endplate deformity. Underlying osteopenia. Superimposed chronic right SI joint arthrodesis hardware. Visible sacrum and SI joints appears stable. Paraspinal and other soft tissues: Aortoiliac calcified atherosclerosis. Chronic right renal atrophy. Noncontrast visible abdominal viscera appear stable. Distended urinary bladder. Diverticulosis of the distal large bowel. Disc levels: Stable CT appearance of chronic lumbar spine degeneration since 2023 except L3-L4:  Progressed vacuum disc. Posttraumatic mild retropulsion of the L4 posterosuperior endplate and increased multifactorial moderate to severe spinal stenosis at that level (series 4, image 75 now versus series 3, image 38 there in 2023. Superimposed chronic bulky facet arthropathy. Chronic moderate to severe bilateral L3 foraminal stenosis has not significantly changed. IMPRESSION: 1. Acute to subacute: - L4 compression fracture with 30% loss of vertebral body height, mild retropulsion, and subsequent increased moderate to severe multifactorial spinal stenosis at L3-L4. Chronic L3 foraminal stenosis not significantly changed. - right L3 transverse process fracture. 2. Other chronic lumbar spine degeneration otherwise appears stable by CT since 2023. 3. Distended urinary bladder, query Urinary Retention. 4. Aortic Atherosclerosis (ICD10-I70.0). Large bowel diverticulosis. Chronic right renal atrophy. Electronically Signed   By: Odessa Fleming M.D.   On: 06/08/2023 13:01    Kendell Bane, MD  [facility]  Total of 55 minutes of critical care time was spent evaluating and examining the patient.  All records were reviewed including medication, labs, drawn plan of care, discussion with ICU staff  If 7PM-7AM, please contact night-coverage [email or web address] Password [email or web address] 06/12/2023, 11:04 AM   LOS: 4 days

## 2023-06-12 NOTE — Plan of Care (Signed)
Problem: Education: Goal: Knowledge of General Education information will improve Description: Including pain rating scale, medication(s)/side effects and non-pharmacologic comfort measures Outcome: Not Progressing   Problem: Activity: Goal: Risk for activity intolerance will decrease Outcome: Not Progressing   Problem: Nutrition: Goal: Adequate nutrition will be maintained Outcome: Not Progressing
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
## 2023-06-12 NOTE — Progress Notes (Signed)
Orthopedic Tech Progress Note Patient Details:  Jessica Bell [birth year] [id number]  Patient ID: Jessica Bell, female   DOB: 22-Feb-1961, 63 y.o.   MRN: [id number] [facility] nurse Melida Gimenez, RN called about back brace for pt that was ordered on the Jan 25. 2025. I called Scott who verified it was delivered to [facility] on the 25th. Scott mentions that [employer] rep noted that pt. Was covered in diarrhea. Possibly this back brace got soiled at some point between 06-08-23 and today. I informed Mabeline Caras at [facility] of this discussion. Jimmie Molly 06/12/2023, 4:32 PM

## 2023-06-12 NOTE — Progress Notes (Addendum)
Unable to locate patient back brace when vendor confirmed that it was delivered, writer called 300 unit and spoke with charge nurse who stated she did not find it on the unit. Back brace did NOT come down from 300 when patient was transferred to ICU unit yesterday afternoon (06/11/23). Notified Dr Flossie Dibble who placed new order for back brace. Called Ortho Tech to confirm that order was placed.

## 2023-06-12 NOTE — Progress Notes (Signed)
Pharmacy Antibiotic Note  Jessica Bell is a 63 y.o. female admitted on 06/08/2023 with sepsis.  Pharmacy has been consulted for cefepime dosing.  Plan: Cefepime 2000 mg IV every 8 hours. Monitor labs, c/s, and patient improvement.  Height: 5\' 4"  (162.6 cm) Weight: 78.5 kg (173 lb 1 oz) IBW/kg (Calculated) : 54.7  Temp (24hrs), Avg:99.1 F (37.3 C), Min:98.4 F (36.9 C), Max:99.7 F (37.6 C)  Recent Labs  Lab 06/08/23 1223 06/09/23 0513 06/10/23 1414 06/11/23 0428 06/12/23 0513  WBC 11.5* 12.6* 18.8* 13.5* 18.8*  CREATININE 0.52 0.48 0.70 0.74 0.64  LATICACIDVEN  --   --  1.2  --   --     Estimated Creatinine Clearance: 72.9 mL/min (by C-G formula based on SCr of 0.64 mg/dL).    Allergies  Allergen Reactions   Amoxicillin-Pot Clavulanate Anaphylaxis, Diarrhea and Nausea And Vomiting   Duloxetine Hcl Other (See Comments)   Fluoxetine Other (See Comments)   Hydroxyzine Hcl Other (See Comments)   Mirtazapine Other (See Comments)   Nsaids Other (See Comments)    stomach issues   Sertraline Other (See Comments)   Sulfamethoxazole-Trimethoprim Diarrhea   Tramadol Other (See Comments)   Bupropion Other (See Comments)   Lorazepam Anxiety    Antimicrobials this admission: Cefepime 1/29 >> CTX 1/28  Flagyl 1/28 >> Merrem 1/27 >>1/28 Keflex 1/25  Microbiology results: 1/27 Bcx: ngtd Ucx: ng MRSA PCR: neg  Thank you for allowing pharmacy to be a part of this patient's care.  Judeth Cornfield, PharmD Clinical Pharmacist 06/12/2023 11:54 AM

## 2023-06-13 DIAGNOSIS — S32040G Wedge compression fracture of fourth lumbar vertebra, subsequent encounter for fracture with delayed healing: Secondary | ICD-10-CM | POA: Diagnosis not present

## 2023-06-13 LAB — CBC
HCT: 38.9 % (ref 36.0–46.0)
Hemoglobin: 12.9 g/dL (ref 12.0–15.0)
MCH: 27.6 pg (ref 26.0–34.0)
MCHC: 33.2 g/dL (ref 30.0–36.0)
MCV: 83.1 fL (ref 80.0–100.0)
Platelets: 368 10*3/uL (ref 150–400)
RBC: 4.68 MIL/uL (ref 3.87–5.11)
RDW: 15.1 % (ref 11.5–15.5)
WBC: 15.9 10*3/uL — ABNORMAL HIGH (ref 4.0–10.5)
nRBC: 0 % (ref 0.0–0.2)

## 2023-06-13 LAB — BASIC METABOLIC PANEL
Anion gap: 12 (ref 5–15)
BUN: 9 mg/dL (ref 8–23)
CO2: 15 mmol/L — ABNORMAL LOW (ref 22–32)
Calcium: 8.4 mg/dL — ABNORMAL LOW (ref 8.9–10.3)
Chloride: 114 mmol/L — ABNORMAL HIGH (ref 98–111)
Creatinine, Ser: 0.61 mg/dL (ref 0.44–1.00)
GFR, Estimated: >60 mL/min (ref >60)
Glucose, Bld: 105 mg/dL — ABNORMAL HIGH (ref 70–99)
Potassium: 3 mmol/L — ABNORMAL LOW (ref 3.5–5.1)
Sodium: 141 mmol/L (ref 135–145)

## 2023-06-13 MED ORDER — LABETALOL HCL 200 MG PO TABS
200.0000 mg | ORAL_TABLET | Freq: Two times a day (BID) | ORAL | Status: DC
Start: 2023-06-13 — End: 2023-06-16
  Administered 2023-06-13 – 2023-06-16 (×7): 200 mg via ORAL
  Filled 2023-06-13 (×7): qty 1

## 2023-06-13 MED ORDER — PANTOPRAZOLE SODIUM 40 MG PO TBEC
40.0000 mg | DELAYED_RELEASE_TABLET | Freq: Every day | ORAL | Status: DC
  Administered 2023-06-13 – 2023-06-16 (×4): 40 mg via ORAL
  Filled 2023-06-13 (×4): qty 1

## 2023-06-13 NOTE — Progress Notes (Signed)
Per verbal from Dr Flossie Dibble, keep foley in today 06/12/23.

## 2023-06-13 NOTE — Plan of Care (Signed)
?  Problem: Education: ?Goal: Knowledge of General Education information will improve ?Description: Including pain rating scale, medication(s)/side effects and non-pharmacologic comfort measures ?Outcome: Progressing ?  ?Problem: Health Behavior/Discharge Planning: ?Goal: Ability to manage health-related needs will improve ?Outcome: Progressing ?  ?Problem: Coping: ?Goal: Level of anxiety will decrease ?Outcome: Progressing ?  ?

## 2023-06-13 NOTE — Progress Notes (Signed)
Patient with low grade fever this morning, but refusing to open mouth for medications. Blankets removed. Temperature reassessed and intervention was effective

## 2023-06-13 NOTE — Progress Notes (Signed)
PROGRESS NOTE  Jessica Bell ZOX:[id number] DOB: Dec 04, 1960 DOA: 06/08/2023 PCP: Pcp, No  Brief History:  63 year old female with a history of COPD, chronic respiratory failure on 5 L, tobacco abuse, HFpEF, hypertension, PRES, hyperlipidemia, opioid dependence, coronary disease, GERD, hypothyroidism presenting with worsening back pain radiating to bilateral legs.  The patient sustained a mechanical fall about 2 weeks prior to admission.  Since that period time, the patient has had increasing pain.  At baseline, the patient has been ambulating with a walker.  However because of the pain she is having more difficulty.  She is only be able to take a few steps.  Her significant other is having difficulty caring for her.  As result, she was brought to the emergency department for further evaluation and treatment.  The patient denies any fevers, chills, headache, chest pain, shortness breath, hemoptysis, nausea, vomiting, diarrhea, abdominal pain. Notably, the patient administers her own medications.  She states that she has not been 100% compliant. In the ED, the patient was afebrile hemodynamically stable with oxygen saturation 93 to 96% on 5 L.  WBC 11.5, hemoglobin 11.5, platelets 203.  Sodium 135, potassium 3.6, bicarbonate 34, serum creatinine 0.52.  LFTs were unremarkable.  MRI of the lumbar spine showed acute to subacute compression fracture with 20% loss in height.  There was 2 to 3 mm bony retropulsion.  It appeared benign and mechanical in appearance.  There is multifactorial L3-L4 moderate spinal stenosis with moderate right and mild left L3 foraminal stenosis.  There is reactive marrow edema bilateral L3-4 facets secondary to facet arthritis.  There is mild spinal stenosis L1-2, L2-3. CT lumbar spine had similar findings.  Neurosurgery, Dr. Conchita Paris was consulted and recommended continue medical therapy, pain control, and outpatient follow-up. The patient significant other stated that he  could no longer take care of the patient because of the patient's progressive debility.  As result the patient was admitted for further evaluation and treatment.  ======================================================================   Subjective: The patient was seen and examined this morning, much more awake, but agitated, mildly confused Has taking oral medication from nursing staff Blood pressure improved, off nicardipine drip   Transferring out of ICU today  Assessment/Plan:  Acute metabolic Encephalopathy -Much improved mental status,-awake agitated -suspect PRES--wide variations in BP since hospitalization -1/27 CT brain neg -06/12/2023 MRI of the brain reviewed, motion artifact negative for any overt intracranial abnormality -EEG -no seizure activity -Continue with neurochecks  -1/28 transfer to ICU -out of ICU 06/13/2023  -start Nicardipine drip -blood pressure stabilized, discontinue nicardipine drip    -7.63/38/58/40  -ammonia 29 -B12 574 -TSH--0.553 -stopped opioids 1/27  Discussed with neurologist regarding encephalopathy, differential diagnosis were discussed in detail-ruled out sepsis, acute stroke  Possibly due to polypharmacy, withdrawals from narcotics, benzodiazepines Restarting gabapentin down from 300 to 100 mg p.o. 3 times daily  Benzodiazepine challenge as needed   Intractable back pain/increased debility/physical deconditioning Compression fracture L4 -TLSO brace -Judicious opioids--continue IV Dilaudid every 3 hours as needed  We need to adjust narcotics for better mental status evaluation  -PT evaluation>>SNF  Aspiration pneumonitis -Afebrile, no leukocytosis -1/27 CT chest--patchy GGO LLL -merrem starte 1/27 -speech therapy eval   Hypokalemia  -Repleting with IV -Repleted, check a magnesium level    urinary retention -Foley catheter inserted 06/08/2023-continue for 1 more day Will discontinue Foley catheter  -UA 11-20WBC -urine  culture neg   Pyuria -Follow urine cultures--neg   Tobacco abuse  Tobacco cessation discussed Nicoderm patch   Opioid dependence (HCC) PDMP reviewed Oxycodone 10 mg, #90--refilled 06/07/23 Morphine ER 15 mg, #30--filled 06/04/23 Morphine concentrate 100mg /5cc--30 cc--filled 05/31/23, 05/20/23 Xanax 1 mg, #45, filled 06/12/23, 05/19/33   Chronic HFpEF Clinically euvolemic -monitor closely sleep with oral intake now 05/11/2021 echo EF 50-55%, G1DD Distal septal HK Restart carvedilol-increasing dose   COPD (chronic obstructive pulmonary disease) (HCC) As needed bronchodilators -No signs of exacerbation, monitoring   Essential hypertension Restart carvedilol -increasing dose, on nicardipine drip-discontinued Anticipating adding calcium channel blockers   Dyslipidemia Continue statin   CAD S/P percutaneous coronary angioplasty No chest pain presently Continue Plavix and statin   Hypothyroidism Continue synthroid   Obesity BMI 31.07 Lifestyle modification                   Family Communication:   significant other updated 1/27   Consultants:  none   Code Status:  DNR   DVT Prophylaxis:  Perry Lovenox     Procedures: As Listed in Progress Note Above   Antibiotics: Merrem 1/27>>     Objective: Vitals:   06/13/23 1100 06/13/23 1129 06/13/23 1130 06/13/23 1200  BP: (!) 164/84  (!) 149/86 (!) 158/74  Pulse: 80  85 80  Resp: (!) 33  (!) 31 (!) 32  Temp:  97.9 F (36.6 C)    TempSrc:  Oral    SpO2: 96%  97% 96%  Weight:      Height:        Intake/Output Summary (Last 24 hours) at 06/13/2023 1231 Last data filed at 06/13/2023 0900 Gross per 24 hour  Intake 2443.77 ml  Output 2000 ml  Net 443.77 ml   Weight change: -0.1 kg          General:  Much more awake today, agitated,  HEENT:  Normocephalic, PERRL, otherwise with in Normal limits   Neuro:  CNII-XII intact. , normal motor and sensation, reflexes intact   Lungs:   Clear to auscultation BL,  Respirations unlabored,  No wheezes / crackles  Cardio:    S1/S2, RRR, No murmure, No Rubs or Gallops   Abdomen:  Soft, non-tender, bowel sounds active all four quadrants, no guarding or peritoneal signs.  Muscular  skeletal:  Limited exam -global generalized weaknesses - in bed, able to move all 4 extremities,   2+ pulses,  symmetric, No pitting edema  Skin:  Dry, warm to touch, negative for any Rashes,  Wounds: Please see nursing documentation           I have personally reviewed following labs and imaging studies Basic Metabolic Panel: Recent Labs  Lab 06/09/23 0513 06/10/23 1414 06/11/23 0428 06/12/23 0513 06/13/23 0419  NA 135 131* 137 138 141  K 3.6 2.6* 3.3* 3.1* 3.0*  CL 93* 87* 98 104 114*  CO2 34* 31 26 21* 15*  GLUCOSE 101* 127* 123* 136* 105*  BUN 5* 7* 8 6* 9  CREATININE 0.48 0.70 0.74 0.64 0.61  CALCIUM 9.1 8.9 9.0 8.9 8.4*  MG 1.6* 2.2 2.0 1.6*  --   PHOS 2.7  --   --   --   --    Liver Function Tests: Recent Labs  Lab 06/09/23 0513  AST 16  ALT 18  ALKPHOS 96  BILITOT 0.8  PROT 6.2*  ALBUMIN 3.3*   No results for input(s): "LIPASE", "AMYLASE" in the last 168 hours. Recent Labs  Lab 06/10/23 1414  AMMONIA 29   Coagulation Profile: No results  for input(s): "INR", "PROTIME" in the last 168 hours. CBC: Recent Labs  Lab 06/08/23 1223 06/09/23 0513 06/10/23 1414 06/11/23 0428 06/12/23 0513 06/13/23 0419  WBC 11.5* 12.6* 18.8* 13.5* 18.8* 15.9*  NEUTROABS 9.3*  --   --   --   --   --   HGB 11.5* 11.5* 12.2 12.7 13.4 12.9  HCT 34.4* 35.9* 35.3* 37.5 39.4 38.9  MCV 86.9 84.5 80.4 82.2 81.7 83.1  PLT 254 303 400 395 372 368   Cardiac Enzymes: No results for input(s): "CKTOTAL", "CKMB", "CKMBINDEX", "TROPONINI" in the last 168 hours. BNP: Invalid input(s): "POCBNP" CBG: Recent Labs  Lab 06/11/23 1606  GLUCAP 111*   HbA1C: No results for input(s): "HGBA1C" in the last 72 hours. Urine analysis:    Component Value Date/Time    COLORURINE STRAW (A) 06/08/2023 1444   APPEARANCEUR CLEAR 06/08/2023 1444   LABSPEC 1.005 06/08/2023 1444   PHURINE 7.0 06/08/2023 1444   GLUCOSEU NEGATIVE 06/08/2023 1444   HGBUR SMALL (A) 06/08/2023 1444   BILIRUBINUR NEGATIVE 06/08/2023 1444   KETONESUR NEGATIVE 06/08/2023 1444   PROTEINUR NEGATIVE 06/08/2023 1444   NITRITE NEGATIVE 06/08/2023 1444   LEUKOCYTESUR MODERATE (A) 06/08/2023 1444   Sepsis Labs: @LABRCNTIP (procalcitonin:4,lacticidven:4) ) Recent Results (from the past 240 hours)  Urine Culture     Status: None   Collection Time: 06/08/23  7:04 PM   Specimen: Urine, Clean Catch  Result Value Ref Range Status   Specimen Description   Final    URINE, CLEAN CATCH Performed at [facility], [address] [place], [place] [address]    Special Requests   Final    NONE Performed at [facility], [address], [place], [place] [address]    Culture   Final    NO GROWTH Performed at [facility], [address], [place], [place] [address]    Report Status 06/10/2023 FINAL  Final  Culture, blood (Routine X 2) w Reflex to ID Panel     Status: None (Preliminary result)   Collection Time: 06/10/23  2:14 PM   Specimen: BLOOD  Result Value Ref Range Status   Specimen Description BLOOD BLOOD RIGHT ARM  Final   Special Requests   Final    BOTTLES DRAWN AEROBIC AND ANAEROBIC Blood Culture adequate volume   Culture   Final    NO GROWTH 3 DAYS Performed at [facility], [address], [place], [place] [address]    Report Status PENDING  Incomplete  Culture, blood (Routine X 2) w Reflex to ID Panel     Status: None (Preliminary result)   Collection Time: 06/10/23  2:17 PM   Specimen: BLOOD  Result Value Ref Range Status   Specimen Description BLOOD BLOOD LEFT ARM  Final   Special Requests   Final    Blood Culture adequate volume BOTTLES DRAWN AEROBIC AND ANAEROBIC   Culture   Final    NO GROWTH 3 DAYS Performed at [facility], [address], [place], [place]  [address]    Report Status PENDING  Incomplete  MRSA Next Gen by PCR, Nasal     Status: None   Collection Time: 06/11/23  3:42 PM   Specimen: Nasal Mucosa; Nasal Swab  Result Value Ref Range Status   MRSA by PCR Next Gen NOT DETECTED NOT DETECTED Final    Comment: (NOTE) The GeneXpert MRSA Assay (FDA approved for NASAL specimens only), is one component of a comprehensive MRSA colonization surveillance program. It is not  intended to diagnose MRSA infection nor to guide or monitor treatment for MRSA infections. Test performance is not FDA approved in patients less than 82 years old. Performed at [facility], [address], [place], [place] [address]      Scheduled Meds:  acetaminophen  1,000 mg Oral Q8H   acidophilus  2 capsule Oral TID   arformoterol  15 mcg Nebulization BID   atorvastatin  80 mg Oral Daily   budesonide (PULMICORT) nebulizer solution  0.5 mg Nebulization BID   carvedilol  25 mg Oral BID WC   Chlorhexidine Gluconate Cloth  6 each Topical Q0600   clopidogrel  75 mg Oral Daily   feeding supplement  237 mL Oral BID BM   gabapentin  100 mg Oral TID   ipratropium-albuterol  3 mL Nebulization BID   levothyroxine  100 mcg Oral Q0600   losartan  50 mg Oral Daily   nicotine  21 mg Transdermal Daily   pantoprazole (PROTONIX) IV  40 mg Intravenous Daily   Continuous Infusions:  ceFEPime (MAXIPIME) IV Stopped (06/13/23 7253)   metronidazole 500 mg (06/13/23 1035)   niCARDipine Stopped (06/13/23 0615)    Procedures/Studies: DG CHEST PORT 1 VIEW Result Date: 06/12/2023 CLINICAL DATA:  Weakness.  Leukocytosis. EXAM: PORTABLE CHEST 1 VIEW COMPARISON:  06/10/2023 FINDINGS: Lungs are clear without airspace disease or pulmonary edema. Heart and mediastinum are within normal limits. Atherosclerotic calcifications at the aortic arch. Trachea is midline. No acute bone abnormality. IMPRESSION: No acute cardiopulmonary disease. Electronically Signed   By: Richarda Overlie M.D.   On:  06/12/2023 15:26   MR BRAIN WO CONTRAST Result Date: 06/12/2023 CLINICAL DATA:  Altered mental status, right gaze preference EXAM: MRI HEAD WITHOUT CONTRAST TECHNIQUE: Multiplanar, multiecho pulse sequences of the brain and surrounding structures were obtained without intravenous contrast. COMPARISON:  06/11/2023 CT head, 09/20/2021 MRI head FINDINGS: Evaluation is limited by motion and the absence of several sequences, as the patient attempted to remove the head coil and exit the scanner. Brain: No restricted diffusion to suggest acute or subacute infarct. Patchy and confluent T2 hyperintense signal in the cortical and subcortical aspects of the right greater than left parietal lobe, right occipital lobe, and bilateral cerebellar hemispheres, with increased involvement of the right parietal and occipital lobes compared to prior exam. No acute hemorrhage, mass, mass effect, or midline shift. No hydrocephalus or extra-axial collection. Vascular: Grossly normal arterial flow voids, although motion limited evaluation. Skull and upper cervical spine: Limited evaluation. No acute finding. Sinuses/Orbits: Limited evaluation.  No acute finding. IMPRESSION: 1. Evaluation is limited by motion and the absence of several sequences, as the patient attempted to remove the head coil and exit the scanner. Within this limitation, T2 hyperintense signal in the right greater than left parietooccipital regions and cerebellum, concerning for PRES. 2. No evidence of acute or subacute infarct. Electronically Signed   By: Wiliam Ke M.D.   On: 06/12/2023 14:51   EEG adult Result Date: 06/11/2023 Charlsie Quest, MD     06/11/2023  5:25 PM Patient Name: DAMICA GRAVLIN MRN: [id number] Epilepsy Attending: Charlsie Quest Referring Physician/Provider: Catarina Hartshorn, MD Date: 06/11/2023 Duration: 24.03 mins Patient history: 63yo F with ams. EEG to evaluate for seizure.  Level of alertness: Awake  AEDs during EEG study: None  Technical  aspects: This EEG study was done with scalp electrodes positioned according to the 10-20 International system of electrode placement. Electrical activity was acquired at a sampling rate of 500Hz   and reviewed with a high frequency filter of 70Hz  and a low frequency filter of 1Hz . EEG data were recorded continuously and digitally stored.  Description: The posterior dominant rhythm consists of 6 Hz activity of moderate voltage (25-35 uV) seen predominantly in posterior head regions, symmetric and reactive to eye opening and eye closing. EEG showed continuous generalized 5 to 6 Hz theta slowing. Hyperventilation and photic stimulation were not performed.    ABNORMALITY - Continuous slow, generalized  IMPRESSION: This study is suggestive of moderate diffuse encephalopathy.. No seizures or epileptiform discharges were seen throughout the recording.  Priyanka Annabelle Harman   CT HEAD WO CONTRAST ( ) Result Date: 06/11/2023 CLINICAL DATA:  Mental status change, unknown cause. New right gaze preference. Hypertension with systolic blood pressure greater than 200. History of PRES. EXAM: CT HEAD WITHOUT CONTRAST TECHNIQUE: Contiguous axial images were obtained from the base of the skull through the vertex without intravenous contrast. RADIATION DOSE REDUCTION: This exam was performed according to the departmental dose-optimization program which includes automated exposure control, adjustment of the mA and/or kV according to patient size and/or use of iterative reconstruction technique. COMPARISON:  Head CT 06/10/2023 FINDINGS: Brain: There are new moderate-sized regions of hypodensity in the right occipital and right parietal lobes most resembling vasogenic edema with milder edema in the left occipital lobe. There is also mild edema in the right and possibly left cerebellar hemispheres. No acute intracranial hemorrhage, midline shift, or extra-axial fluid collection is identified. The ventricles are normal in size. Vascular: No  hyperdense vessel. Skull: No acute fracture or suspicious osseous lesion. Sinuses/Orbits: Mucosal thickening and small volume fluid in the left sphenoid sinus, unchanged. Clear mastoid air cells. Unremarkable orbits. Other: None. IMPRESSION: New multifocal edema involving the right greater than left posterior cerebral hemispheres and cerebellum most suggestive of recurrent PRES. Brain MRI with and without contrast is recommended for further evaluation. Electronically Signed   By: Sebastian Ache M.D.   On: 06/11/2023 15:56   CT CHEST WO CONTRAST Result Date: 06/11/2023 CLINICAL DATA:  Respiratory illness EXAM: CT CHEST WITHOUT CONTRAST TECHNIQUE: Multidetector CT imaging of the chest was performed following the standard protocol without IV contrast. RADIATION DOSE REDUCTION: This exam was performed according to the departmental dose-optimization program which includes automated exposure control, adjustment of the mA and/or kV according to patient size and/or use of iterative reconstruction technique. COMPARISON:  Chest x-ray same day.  CT of the chest 05/23/2020 FINDINGS: Cardiovascular: No significant vascular findings. Normal heart size. No pericardial effusion. 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 There are atherosclerotic calcifications of the aorta and coronary arteries. Mediastinum/Nodes: No enlarged mediastinal or axillary lymph nodes. Thyroid gland, trachea, and esophagus demonstrate no significant findings. Lungs/Pleura: There are minimal patchy ground-glass opacities in the medial left lower lobe and lingula, likely infectious/inflammatory. Mild emphysema present. The there are few re is a stable peripheral unchanged nodular densities in the left upper lobe measuring up to 4 mm. There is no pleural effusion or pneumothorax. Trachea and central airways are patent. Upper Abdomen: No acute abnormality. Musculoskeletal: No chest wall mass or suspicious bone lesions identified.  IMPRESSION: 1. Minimal patchy ground-glass opacities in the medial left lower lobe and lingula, likely infectious/inflammatory. 2. Stable left upper lobe pulmonary nodules measuring up to 4 mm. No follow-up needed if patient is low-risk (and has no known or suspected primary neoplasm). Non-contrast chest CT can be considered in 12 months if patient is high-risk. This recommendation follows the consensus statement: Guidelines  for Management of Incidental Pulmonary Nodules Detected on CT Images: From the Fleischner Society 2017; Radiology 2017; [phone]. Aortic Atherosclerosis (ICD10-I70.0) and Emphysema (ICD10-J43.9). Electronically Signed   By: Darliss Cheney M.D.   On: 06/11/2023 00:04   CT HEAD WO CONTRAST ( ) Result Date: 06/10/2023 CLINICAL DATA:  Mental status change, unknown cause EXAM: CT HEAD WITHOUT CONTRAST TECHNIQUE: Contiguous axial images were obtained from the base of the skull through the vertex without intravenous contrast. RADIATION DOSE REDUCTION: This exam was performed according to the departmental dose-optimization program which includes automated exposure control, adjustment of the mA and/or kV according to patient size and/or use of iterative reconstruction technique. COMPARISON:  CT head June 2, 23. FINDINGS: Brain: No evidence of acute infarction, hemorrhage, hydrocephalus, extra-axial collection or mass lesion/mass effect. Vascular: No hyperdense vessel identified. Skull: No acute fracture. Sinuses/Orbits: No acute finding. Other: No mastoid effusions. IMPRESSION: No evidence of acute intracranial abnormality. Electronically Signed   By: Feliberto Harts M.D.   On: 06/10/2023 19:17   DG CHEST PORT 1 VIEW Result Date: 06/10/2023 CLINICAL DATA:  [id number] Acute respiratory failure with hypoxia [facility] [id number] EXAM: PORTABLE CHEST 1 VIEW COMPARISON:  10/13/2021 FINDINGS: Heart and mediastinal contours are within normal limits. Diffuse interstitial prominence throughout the lungs. No  effusions or acute bony abnormality. Aortic atherosclerosis. IMPRESSION: Diffuse interstitial prominence throughout the lungs could reflect chronic interstitial lung disease or atypical infection. Electronically Signed   By: Charlett Nose M.D.   On: 06/10/2023 13:33   MR LUMBAR SPINE WO CONTRAST Result Date: 06/08/2023 CLINICAL DATA:  Initial evaluation for acute compression fracture, lower back pain EXAM: MRI LUMBAR SPINE WITHOUT CONTRAST TECHNIQUE: Multiplanar, multisequence MR imaging of the lumbar spine was performed. No intravenous contrast was administered. COMPARISON:  CT from earlier the same day. FINDINGS: Segmentation:  Examination degraded by motion artifact. Standard segmentation. Lowest well-formed disc space labeled the L5-S1 level. Alignment: Mild straightening of the normal lumbar lordosis with trace dextroscoliosis. No significant listhesis. Vertebrae: Acute to subacute compression fracture involving the superior endplate of L4 with mild 20% height loss and trace 2-3 mm bony retropulsion. This is benign/mechanical in appearance. Marrow edema at the adjacent inferior endplate of L3 favored to be reactive/degenerative. No convincing fracture seen at this location on prior CT. No other acute or recent fracture. Mild chronic compression deformities involving the superior endplates of T12 and L2 noted. Bone marrow signal intensity mildly heterogeneous but overall within normal limits. No worrisome osseous lesions. Reactive marrow edema present about the bilateral L3-4 facets due to facet arthritis. Postoperative changes from prior lateral screw fixation across the right SI joint noted. Conus medullaris and cauda equina: Conus extends to the L1-2 level. Conus and cauda equina appear normal. Paraspinal and other soft tissues: Mild edema noted within the psoas musculature adjacent to the L4 fracture. Paraspinous soft tissues demonstrate no other acute finding. Asymmetric right renal atrophy with a few  scattered T2 hyperintense cysts, largest of which measures 1.5 cm. These are benign in appearance, no follow-up imaging recommended. Subcentimeter simple cyst noted with subcapsular posterior right hepatic lobe, also likely benign. Disc levels: T12-L1: Minimal disc bulge. Mild bilateral facet hypertrophy. No stenosis. L1-2: Degenerative intervertebral disc space narrowing with diffuse disc bulge and disc desiccation. Superimposed irregular right subarticular to extraforaminal disc protrusion (series 6, image 10). Superimposed reactive endplate spurring. Mild facet hypertrophy. Prominence of the dorsal epidural fat. Mild spinal stenosis. Foramina remain patent. L2-3: Degenerative intervertebral disc space narrowing with diffuse disc bulge and  reactive endplate spurring, asymmetric to the right. Moderate facet hypertrophy. Resultant mild spinal stenosis. Mild right L2 foraminal narrowing. Left neural foramina remains patent. L3-4: Disc desiccation with diffuse disc bulge. Possible superimposed right subarticular disc extrusion with superior migration, best seen on axial T2 weighted sequence (series 6, images 22, 21). This is not well seen on corresponding sequences. Severe bilateral facet arthrosis with reactive marrow edema. Trace 2-3 mm bony retropulsion related to the L4 fracture. Mild epidural lipomatosis. Resultant moderate spinal stenosis. Moderate right with mild left L3 foraminal narrowing. L4-5: Advanced degenerative disc space narrowing with disc desiccation and diffuse disc bulge. Reactive endplate spurring. Moderate facet hypertrophy. Resultant mild bilateral subarticular stenosis. Central canal remains patent. Mild right greater than left L4 foraminal stenosis. L5-S1: Disc desiccation with minimal disc bulge. Small central annular fissure. Moderate left greater than right facet hypertrophy. No significant spinal stenosis. Mild left L5 foraminal narrowing. Right neural foramen remains patent. IMPRESSION:  1. Acute to subacute compression fracture involving the superior endplate of L4 with mild 20% height loss and trace 2-3 mm bony retropulsion. This is benign/mechanical in appearance. 2. Multifactorial degenerative changes at L3-4 with resultant moderate spinal stenosis, with moderate right and mild left L3 foraminal narrowing. Possible superimposed right subarticular disc extrusion with superior migration at this level as above, not entirely certain given motion degradation on this exam. 3. Additional multilevel degenerative spondylosis and facet hypertrophy as above. Resultant mild spinal stenosis at L1-2 and L2-3. 4. Reactive marrow edema about the bilateral L3-4 facets due to facet arthritis. Finding could serve as a source for lower back pain. Electronically Signed   By: Rise Mu M.D.   On: 06/08/2023 17:39   CT Lumbar Spine Wo Contrast Result Date: 06/08/2023 CLINICAL DATA:  63 year old female with low back pain, bilateral leg pain. EXAM: CT LUMBAR SPINE WITHOUT CONTRAST TECHNIQUE: Multidetector CT imaging of the lumbar spine was performed without intravenous contrast administration. Multiplanar CT image reconstructions were also generated. RADIATION DOSE REDUCTION: This exam was performed according to the departmental dose-optimization program which includes automated exposure control, adjustment of the mA and/or kV according to patient size and/or use of iterative reconstruction technique. COMPARISON:  CT Abdomen and Pelvis 10/14/2021. FINDINGS: Segmentation: Normal. Alignment: Straightening of lumbar lordosis not significantly changed from 2023. No significant scoliosis. Mild chronic retrolisthesis of L1 on L2. Vertebrae: L4 superior endplate compression fracture appears unhealed and comminuted. 30% loss of vertebral body height. Prominent new adjacent vacuum disc at L3-L4. Retropulsion of the posterosuperior endplate up to 6 mm. L4 pedicles and posterior elements appear intact, aligned,  chronically degenerated. And degenerative appearing bilateral L4-L5 facet ankylosis appears progressed since 2023. Superimposed mildly displaced right L3 transverse process fracture is new since 2023. Other lumbar vertebrae appear stable since 2023 including mild chronic L2 superior endplate deformity. Underlying osteopenia. Superimposed chronic right SI joint arthrodesis hardware. Visible sacrum and SI joints appears stable. Paraspinal and other soft tissues: Aortoiliac calcified atherosclerosis. Chronic right renal atrophy. Noncontrast visible abdominal viscera appear stable. Distended urinary bladder. Diverticulosis of the distal large bowel. Disc levels: Stable CT appearance of chronic lumbar spine degeneration since 2023 except L3-L4: Progressed vacuum disc. Posttraumatic mild retropulsion of the L4 posterosuperior endplate and increased multifactorial moderate to severe spinal stenosis at that level (series 4, image 75 now versus series 3, image 38 there in 2023. Superimposed chronic bulky facet arthropathy. Chronic moderate to severe bilateral L3 foraminal stenosis has not significantly changed. IMPRESSION: 1. Acute to subacute: - L4 compression fracture  with 30% loss of vertebral body height, mild retropulsion, and subsequent increased moderate to severe multifactorial spinal stenosis at L3-L4. Chronic L3 foraminal stenosis not significantly changed. - right L3 transverse process fracture. 2. Other chronic lumbar spine degeneration otherwise appears stable by CT since 2023. 3. Distended urinary bladder, query Urinary Retention. 4. Aortic Atherosclerosis (ICD10-I70.0). Large bowel diverticulosis. Chronic right renal atrophy. Electronically Signed   By: Odessa Fleming M.D.   On: 06/08/2023 13:01    Kendell Bane, MD  [facility]  Total of 55 minutes of critical care time was spent evaluating and examining the patient.  All records were reviewed including medication, labs, drawn plan of care,  discussion with ICU staff  If 7PM-7AM, please contact night-coverage [email or web address] Password [email or web address] 06/13/2023, 12:31 PM   LOS: 5 days

## 2023-06-13 NOTE — TOC Progression Note (Signed)
Transition of Care [facility]) - Progression Note    Patient Details  Name: Jessica Bell MRN: [id number] Date of Birth: 09/15/1960  [facility] CM/SW Contact  Villa Herb, [place] Phone Number: 06/13/2023, 11:13 AM  Clinical Narrative:    CSW met with pts daughter at bedside to review bed offers. Pts daughter to review options. TOC to follow.   Expected Discharge Plan: Skilled Nursing Facility Barriers to Discharge: Continued Medical Work up  Expected Discharge Plan and Services In-house Referral: Clinical Social Work Discharge Planning Services: CM Consult Post Acute Care Choice: Skilled Nursing Facility Living arrangements for the past 2 months: Single Family Home                                       Social Determinants of Health (SDOH) Interventions SDOH Screenings   Food Insecurity: No Food Insecurity (06/08/2023)  Housing: Low Risk  (06/08/2023)  Transportation Needs: No Transportation Needs (06/08/2023)  Utilities: Not At Risk (06/08/2023)  Financial Resource Strain: Low Risk  (09/05/2021)   [facility], [facility], [facility]  Physical Activity: Inactive (09/05/2021)   [facility], [facility]  Social Connections: Moderately Isolated (06/08/2023)  Stress: Stress Concern Present (09/05/2021)   Received from [facility], [facility], [facility]  Tobacco Use: High Risk (06/08/2023)    Readmission Risk Interventions    06/10/2023   11:38 AM 10/16/2021   11:41 AM 08/24/2021   11:58 AM  Readmission Risk Prevention Plan  Transportation Screening Complete Complete Complete  Home Care Screening Complete    Medication Review (RN CM) Complete    Medication Review Oceanographer)  Complete Complete  HRI or Home Care Consult  Complete Complete  SW Recovery Care/Counseling Consult  Complete Complete  Palliative Care Screening  Not Applicable Not Applicable  Skilled Nursing Facility  Not Applicable  Not Applicable

## 2023-06-14 ENCOUNTER — Inpatient Hospital Stay (HOSPITAL_COMMUNITY): Payer: Medicare Other

## 2023-06-14 DIAGNOSIS — S32040G Wedge compression fracture of fourth lumbar vertebra, subsequent encounter for fracture with delayed healing: Secondary | ICD-10-CM | POA: Diagnosis not present

## 2023-06-14 DIAGNOSIS — G928 Other toxic encephalopathy: Secondary | ICD-10-CM | POA: Diagnosis not present

## 2023-06-14 LAB — CBC
HCT: 36 % (ref 36.0–46.0)
Hemoglobin: 11.9 g/dL — ABNORMAL LOW (ref 12.0–15.0)
MCH: 28.2 pg (ref 26.0–34.0)
MCHC: 33.1 g/dL (ref 30.0–36.0)
MCV: 85.3 fL (ref 80.0–100.0)
Platelets: 312 10*3/uL (ref 150–400)
RBC: 4.22 MIL/uL (ref 3.87–5.11)
RDW: 15.8 % — ABNORMAL HIGH (ref 11.5–15.5)
WBC: 10.3 10*3/uL (ref 4.0–10.5)
nRBC: 0 % (ref 0.0–0.2)

## 2023-06-14 LAB — BASIC METABOLIC PANEL
Anion gap: 10 (ref 5–15)
BUN: 16 mg/dL (ref 8–23)
CO2: 16 mmol/L — ABNORMAL LOW (ref 22–32)
Calcium: 8.2 mg/dL — ABNORMAL LOW (ref 8.9–10.3)
Chloride: 114 mmol/L — ABNORMAL HIGH (ref 98–111)
Creatinine, Ser: 0.84 mg/dL (ref 0.44–1.00)
GFR, Estimated: >60 mL/min (ref >60)
Glucose, Bld: 89 mg/dL (ref 70–99)
Potassium: 3.3 mmol/L — ABNORMAL LOW (ref 3.5–5.1)
Sodium: 140 mmol/L (ref 135–145)

## 2023-06-14 MED ORDER — LEVOFLOXACIN 750 MG PO TABS
750.0000 mg | ORAL_TABLET | Freq: Every day | ORAL | Status: DC
  Administered 2023-06-14 – 2023-06-16 (×3): 750 mg via ORAL
  Filled 2023-06-14 (×3): qty 1

## 2023-06-14 MED ORDER — LOSARTAN POTASSIUM 25 MG PO TABS
25.0000 mg | ORAL_TABLET | Freq: Every day | ORAL | Status: DC
  Administered 2023-06-14 – 2023-06-16 (×3): 25 mg via ORAL
  Filled 2023-06-14 (×2): qty 1

## 2023-06-14 MED ORDER — POTASSIUM CHLORIDE CRYS ER 20 MEQ PO TBCR
40.0000 meq | EXTENDED_RELEASE_TABLET | Freq: Once | ORAL | Status: AC
  Administered 2023-06-14: 40 meq via ORAL
  Filled 2023-06-14: qty 2

## 2023-06-14 MED ORDER — SODIUM CHLORIDE 0.9 % IV SOLN
INTRAVENOUS | Status: AC
Start: 2023-06-14 — End: 2023-06-14

## 2023-06-14 NOTE — Progress Notes (Signed)
Order noted for TLSO brace, did not see one in room. Called ortho tech to place order for brace. Stated she will place order to get brace delivered.

## 2023-06-14 NOTE — Progress Notes (Signed)
Received report from [employer]. This Investment banker, operational will be taking over care.

## 2023-06-14 NOTE — Progress Notes (Addendum)
PROGRESS NOTE  Jessica Bell ZOX:[id number] DOB: 06-29-1960 DOA: 06/08/2023 PCP: Pcp, No  Brief History:  63 year old female with a history of COPD, chronic respiratory failure on 5 L, tobacco abuse, HFpEF, hypertension, PRES, hyperlipidemia, opioid dependence, coronary disease, GERD, hypothyroidism presenting with worsening back pain radiating to bilateral legs.  The patient sustained a mechanical fall about 2 weeks prior to admission.  Since that period time, the patient has had increasing pain.  At baseline, the patient has been ambulating with a walker.  However because of the pain she is having more difficulty.  She is only be able to take a few steps.  Her significant other is having difficulty caring for her.  As result, she was brought to the emergency department for further evaluation and treatment.  The patient denies any fevers, chills, headache, chest pain, shortness breath, hemoptysis, nausea, vomiting, diarrhea, abdominal pain. Notably, the patient administers her own medications.  She states that she has not been 100% compliant. In the ED, the patient was afebrile hemodynamically stable with oxygen saturation 93 to 96% on 5 L.  WBC 11.5, hemoglobin 11.5, platelets 203.  Sodium 135, potassium 3.6, bicarbonate 34, serum creatinine 0.52.  LFTs were unremarkable.  MRI of the lumbar spine showed acute to subacute compression fracture with 20% loss in height.  There was 2 to 3 mm bony retropulsion.  It appeared benign and mechanical in appearance.  There is multifactorial L3-L4 moderate spinal stenosis with moderate right and mild left L3 foraminal stenosis.  There is reactive marrow edema bilateral L3-4 facets secondary to facet arthritis.  There is mild spinal stenosis L1-2, L2-3. CT lumbar spine had similar findings.  Neurosurgery, Dr. Conchita Paris was consulted and recommended continue medical therapy, pain control, and outpatient follow-up. The patient significant other stated that he  could no longer take care of the patient because of the patient's progressive debility.  As result the patient was admitted for further evaluation and treatment.  ======================================================================   Subjective: The patient was seen and examined this morning, much more awake, following command, having meaningful conversation   Blood pressure elevated but improved, off nicardipine drip, status post transfer from ICU yesterday remained hemodynamically stable   ---------------------------------------------------------------------------------------------------------------------------  Assessment/Plan:  Acute metabolic Encephalopathy -Much improved, able to communicate, following commands -Mentation improving return to baseline   -On this admission we were suspect PRES--wide variations in BP since hospitalization -1/27 CT brain neg -06/12/2023 MRI of the brain reviewed, motion artifact negative for any overt intracranial abnormality -EEG -no seizure activity -Continue with neurochecks  -1/28 transfer to ICU, off nicardipine drip-out of ICU 06/13/2023    -7.63/38/58/40  -ammonia 29 -B12 574 -TSH--0.553 -stopped opioids 1/27  -Neurologist-consulted-appreciate input  Possibly due to polypharmacy, withdrawals from narcotics, benzodiazepines Restarting gabapentin down from 300 to 100 mg p.o. 3 times daily  Discontinued high-dose benzodiazepine, as needed Xanax   Intractable back pain/increased debility/physical deconditioning Compression fracture L4 -TLSO brace -Judicious opioids--continue IV Dilaudid every 3 hours as needed  We need to adjust narcotics for better mental status evaluation  -PT evaluation>>SNF  Aspiration pneumonitis -Afebrile, no leukocytosis -1/27 CT chest--patchy GGO LLL -merrem starte 1/27 -speech therapy eval   Hypokalemia  -Monitoring and repleting accordingly    urinary retention -Foley catheter inserted  06/08/2023-continue for 1 more day -Foley catheter discontinued -UA 11-20WBC -urine culture neg   Pyuria -Follow urine cultures--neg   Tobacco abuse Tobacco cessation discussed Nicoderm patch   Opioid  dependence (HCC) PDMP reviewed Oxycodone 10 mg, #90--refilled 06/07/23 Morphine ER 15 mg, #30--filled 06/04/23 Morphine concentrate 100mg /5cc--30 cc--filled 05/31/23, 05/20/23 Xanax 1 mg, #45, filled 06/12/23, 05/19/33   Chronic HFpEF Clinically euvolemic -monitor closely sleep with oral intake now 05/11/2021 echo EF 50-55%, G1DD Distal septal HK Restart carvedilol-increasing dose   COPD (chronic obstructive pulmonary disease) (HCC) As needed bronchodilators -No signs of exacerbation, monitoring   Essential hypertension Restart carvedilol -increasing dose, on nicardipine drip-discontinued Anticipating adding calcium channel blockers   Dyslipidemia Continue statin   CAD S/P percutaneous coronary angioplasty No chest pain presently Continue Plavix and statin   Hypothyroidism Continue synthroid   Obesity BMI 31.07 Lifestyle modification           Family Communication:   significant other updated 1/27   Consultants:  none   Code Status:  DNR   DVT Prophylaxis:  Goodman Lovenox     Disposition:   Pending discharge on Monday -pending insurance authorization approved TOC following    procedures: As Listed in Progress Note Above   Antibiotics: Merrem 1/27>>     Objective: Vitals:   06/13/23 1630 06/13/23 1944 06/13/23 2321 06/14/23 0802  BP: (!) 146/80 (!) 127/93 (!) 159/81 124/65  Pulse: 84 91 80 70  Resp: (!) 26 (!) 22 (!) 22 18  Temp: 98.2 F (36.8 C) 99 F (37.2 C) 98.7 F (37.1 C) 97.7 F (36.5 C)  TempSrc: Oral  Oral Oral  SpO2: 99% 99% 98% 100%  Weight:      Height:        Intake/Output Summary (Last 24 hours) at 06/14/2023 1159 Last data filed at 06/14/2023 0930 Gross per 24 hour  Intake 371.07 ml  Output --  Net 371.07 ml   Weight change:      General:  Much more awake, following command,  HEENT:  Normocephalic, PERRL, otherwise with in Normal limits   Neuro:  CNII-XII intact. , normal motor and sensation, reflexes intact   Lungs:   Clear to auscultation BL, Respirations unlabored,  No wheezes / crackles  Cardio:    S1/S2, RRR, No murmure, No Rubs or Gallops   Abdomen:  Soft, non-tender, bowel sounds active all four quadrants, no guarding or peritoneal signs.  Muscular  skeletal:  Limited exam -global generalized weaknesses - in bed, able to move all 4 extremities,   2+ pulses,  symmetric, No pitting edema  Skin:  Dry, warm to touch, negative for any Rashes,  Wounds: Please see nursing documentation           I have personally reviewed following labs and imaging studies Basic Metabolic Panel: Recent Labs  Lab 06/09/23 0513 06/10/23 1414 06/11/23 0428 06/12/23 0513 06/13/23 0419 06/14/23 0520  NA 135 131* 137 138 141 140  K 3.6 2.6* 3.3* 3.1* 3.0* 3.3*  CL 93* 87* 98 104 114* 114*  CO2 34* 31 26 21* 15* 16*  GLUCOSE 101* 127* 123* 136* 105* 89  BUN 5* 7* 8 6* 9 16  CREATININE 0.48 0.70 0.74 0.64 0.61 0.84  CALCIUM 9.1 8.9 9.0 8.9 8.4* 8.2*  MG 1.6* 2.2 2.0 1.6*  --   --   PHOS 2.7  --   --   --   --   --    Liver Function Tests: Recent Labs  Lab 06/09/23 0513  AST 16  ALT 18  ALKPHOS 96  BILITOT 0.8  PROT 6.2*  ALBUMIN 3.3*   No results for input(s): "LIPASE", "AMYLASE" in the last  168 hours. Recent Labs  Lab 06/10/23 1414  AMMONIA 29   Coagulation Profile: No results for input(s): "INR", "PROTIME" in the last 168 hours. CBC: Recent Labs  Lab 06/08/23 1223 06/09/23 0513 06/10/23 1414 06/11/23 0428 06/12/23 0513 06/13/23 0419 06/14/23 0520  WBC 11.5*   < > 18.8* 13.5* 18.8* 15.9* 10.3  NEUTROABS 9.3*  --   --   --   --   --   --   HGB 11.5*   < > 12.2 12.7 13.4 12.9 11.9*  HCT 34.4*   < > 35.3* 37.5 39.4 38.9 36.0  MCV 86.9   < > 80.4 82.2 81.7 83.1 85.3  PLT 254   < > 400  395 372 368 312   < > = values in this interval not displayed.   Cardiac Enzymes: No results for input(s): "CKTOTAL", "CKMB", "CKMBINDEX", "TROPONINI" in the last 168 hours. BNP: Invalid input(s): "POCBNP" CBG: Recent Labs  Lab 06/11/23 1606  GLUCAP 111*   HbA1C: No results for input(s): "HGBA1C" in the last 72 hours. Urine analysis:    Component Value Date/Time   COLORURINE STRAW (A) 06/08/2023 1444   APPEARANCEUR CLEAR 06/08/2023 1444   LABSPEC 1.005 06/08/2023 1444   PHURINE 7.0 06/08/2023 1444   GLUCOSEU NEGATIVE 06/08/2023 1444   HGBUR SMALL (A) 06/08/2023 1444   BILIRUBINUR NEGATIVE 06/08/2023 1444   KETONESUR NEGATIVE 06/08/2023 1444   PROTEINUR NEGATIVE 06/08/2023 1444   NITRITE NEGATIVE 06/08/2023 1444   LEUKOCYTESUR MODERATE (A) 06/08/2023 1444   Sepsis Labs: @LABRCNTIP (procalcitonin:4,lacticidven:4) ) Recent Results (from the past 240 hours)  Urine Culture     Status: None   Collection Time: 06/08/23  7:04 PM   Specimen: Urine, Clean Catch  Result Value Ref Range Status   Specimen Description   Final    URINE, CLEAN CATCH Performed at [facility], [address], [place], [place] [address]    Special Requests   Final    NONE Performed at [facility], [address] [place], [place] [address]    Culture   Final    NO GROWTH Performed at [facility], [address], [place], [place] [address]    Report Status 06/10/2023 FINAL  Final  Culture, blood (Routine X 2) w Reflex to ID Panel     Status: None (Preliminary result)   Collection Time: 06/10/23  2:14 PM   Specimen: BLOOD  Result Value Ref Range Status   Specimen Description BLOOD BLOOD RIGHT ARM  Final   Special Requests   Final    BOTTLES DRAWN AEROBIC AND ANAEROBIC Blood Culture adequate volume   Culture   Final    NO GROWTH 4 DAYS Performed at [facility], [address], [place], [place] [address]    Report Status PENDING  Incomplete  Culture, blood (Routine X 2) w Reflex to ID  Panel     Status: None (Preliminary result)   Collection Time: 06/10/23  2:17 PM   Specimen: BLOOD  Result Value Ref Range Status   Specimen Description BLOOD BLOOD LEFT ARM  Final   Special Requests   Final    Blood Culture adequate volume BOTTLES DRAWN AEROBIC AND ANAEROBIC   Culture   Final    NO GROWTH 4 DAYS Performed at [facility], [address], [place], [place] [address]    Report Status PENDING  Incomplete  MRSA Next Gen by PCR, Nasal     Status: None   Collection Time: 06/11/23  3:42 PM  Specimen: Nasal Mucosa; Nasal Swab  Result Value Ref Range Status   MRSA by PCR Next Gen NOT DETECTED NOT DETECTED Final    Comment: (NOTE) The GeneXpert MRSA Assay (FDA approved for NASAL specimens only), is one component of a comprehensive MRSA colonization surveillance program. It is not intended to diagnose MRSA infection nor to guide or monitor treatment for MRSA infections. Test performance is not FDA approved in patients less than 61 years old. Performed at [facility], [address], [place], [place] [address]      Scheduled Meds:  acetaminophen  1,000 mg Oral Q8H   acidophilus  2 capsule Oral TID   arformoterol  15 mcg Nebulization BID   atorvastatin  80 mg Oral Daily   budesonide (PULMICORT) nebulizer solution  0.5 mg Nebulization BID   Chlorhexidine Gluconate Cloth  6 each Topical Q0600   clopidogrel  75 mg Oral Daily   feeding supplement  237 mL Oral BID BM   gabapentin  100 mg Oral TID   ipratropium-albuterol  3 mL Nebulization BID   labetalol  200 mg Oral BID   levothyroxine  100 mcg Oral Q0600   losartan  25 mg Oral Daily   nicotine  21 mg Transdermal Daily   pantoprazole  40 mg Oral Daily   Continuous Infusions:  sodium chloride 100 mL/hr at 06/14/23 0939   ceFEPime (MAXIPIME) IV 2 g (06/14/23 0532)   metronidazole 500 mg (06/14/23 1108)    Procedures/Studies: DG CHEST PORT 1 VIEW Result Date: 06/14/2023 CLINICAL DATA:  Shortness of breath EXAM:  PORTABLE CHEST 1 VIEW COMPARISON:  Chest x-ray 06/12/2023 FINDINGS: The heart size and mediastinal contours are within normal limits. Both lungs are clear. The visualized skeletal structures are unremarkable. IMPRESSION: No active disease. Electronically Signed   By: Darliss Cheney M.D.   On: 06/14/2023 00:49   DG CHEST PORT 1 VIEW Result Date: 06/12/2023 CLINICAL DATA:  Weakness.  Leukocytosis. EXAM: PORTABLE CHEST 1 VIEW COMPARISON:  06/10/2023 FINDINGS: Lungs are clear without airspace disease or pulmonary edema. Heart and mediastinum are within normal limits. Atherosclerotic calcifications at the aortic arch. Trachea is midline. No acute bone abnormality. IMPRESSION: No acute cardiopulmonary disease. Electronically Signed   By: Richarda Overlie M.D.   On: 06/12/2023 15:26   MR BRAIN WO CONTRAST Result Date: 06/12/2023 CLINICAL DATA:  Altered mental status, right gaze preference EXAM: MRI HEAD WITHOUT CONTRAST TECHNIQUE: Multiplanar, multiecho pulse sequences of the brain and surrounding structures were obtained without intravenous contrast. COMPARISON:  06/11/2023 CT head, 09/20/2021 MRI head FINDINGS: Evaluation is limited by motion and the absence of several sequences, as the patient attempted to remove the head coil and exit the scanner. Brain: No restricted diffusion to suggest acute or subacute infarct. Patchy and confluent T2 hyperintense signal in the cortical and subcortical aspects of the right greater than left parietal lobe, right occipital lobe, and bilateral cerebellar hemispheres, with increased involvement of the right parietal and occipital lobes compared to prior exam. No acute hemorrhage, mass, mass effect, or midline shift. No hydrocephalus or extra-axial collection. Vascular: Grossly normal arterial flow voids, although motion limited evaluation. Skull and upper cervical spine: Limited evaluation. No acute finding. Sinuses/Orbits: Limited evaluation.  No acute finding. IMPRESSION: 1.  Evaluation is limited by motion and the absence of several sequences, as the patient attempted to remove the head coil and exit the scanner. Within this limitation, T2 hyperintense signal in the right greater than left parietooccipital regions and cerebellum, concerning for  PRES. 2. No evidence of acute or subacute infarct. Electronically Signed   By: Wiliam Ke M.D.   On: 06/12/2023 14:51   EEG adult Result Date: 06/11/2023 Charlsie Quest, MD     06/11/2023  5:25 PM Patient Name: KIWANNA SPRAKER MRN: [id number] Epilepsy Attending: Charlsie Quest Referring Physician/Provider: Catarina Hartshorn, MD Date: 06/11/2023 Duration: 24.03 mins Patient history: 63yo F with ams. EEG to evaluate for seizure.  Level of alertness: Awake  AEDs during EEG study: None  Technical aspects: This EEG study was done with scalp electrodes positioned according to the 10-20 International system of electrode placement. Electrical activity was acquired at a sampling rate of 500Hz  and reviewed with a high frequency filter of 70Hz  and a low frequency filter of 1Hz . EEG data were recorded continuously and digitally stored.  Description: The posterior dominant rhythm consists of 6 Hz activity of moderate voltage (25-35 uV) seen predominantly in posterior head regions, symmetric and reactive to eye opening and eye closing. EEG showed continuous generalized 5 to 6 Hz theta slowing. Hyperventilation and photic stimulation were not performed.    ABNORMALITY - Continuous slow, generalized  IMPRESSION: This study is suggestive of moderate diffuse encephalopathy.. No seizures or epileptiform discharges were seen throughout the recording.  Priyanka Annabelle Harman   CT HEAD WO CONTRAST ( ) Result Date: 06/11/2023 CLINICAL DATA:  Mental status change, unknown cause. New right gaze preference. Hypertension with systolic blood pressure greater than 200. History of PRES. EXAM: CT HEAD WITHOUT CONTRAST TECHNIQUE: Contiguous axial images were obtained from the  base of the skull through the vertex without intravenous contrast. RADIATION DOSE REDUCTION: This exam was performed according to the departmental dose-optimization program which includes automated exposure control, adjustment of the mA and/or kV according to patient size and/or use of iterative reconstruction technique. COMPARISON:  Head CT 06/10/2023 FINDINGS: Brain: There are new moderate-sized regions of hypodensity in the right occipital and right parietal lobes most resembling vasogenic edema with milder edema in the left occipital lobe. There is also mild edema in the right and possibly left cerebellar hemispheres. No acute intracranial hemorrhage, midline shift, or extra-axial fluid collection is identified. The ventricles are normal in size. Vascular: No hyperdense vessel. Skull: No acute fracture or suspicious osseous lesion. Sinuses/Orbits: Mucosal thickening and small volume fluid in the left sphenoid sinus, unchanged. Clear mastoid air cells. Unremarkable orbits. Other: None. IMPRESSION: New multifocal edema involving the right greater than left posterior cerebral hemispheres and cerebellum most suggestive of recurrent PRES. Brain MRI with and without contrast is recommended for further evaluation. Electronically Signed   By: Sebastian Ache M.D.   On: 06/11/2023 15:56   CT CHEST WO CONTRAST Result Date: 06/11/2023 CLINICAL DATA:  Respiratory illness EXAM: CT CHEST WITHOUT CONTRAST TECHNIQUE: Multidetector CT imaging of the chest was performed following the standard protocol without IV contrast. RADIATION DOSE REDUCTION: This exam was performed according to the departmental dose-optimization program which includes automated exposure control, adjustment of the mA and/or kV according to patient size and/or use of iterative reconstruction technique. COMPARISON:  Chest x-ray same day.  CT of the chest 05/23/2020 FINDINGS: Cardiovascular: No significant vascular findings. Normal heart size. No pericardial  effusion. 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 There are atherosclerotic calcifications of the aorta and coronary arteries. Mediastinum/Nodes: No enlarged mediastinal or axillary lymph nodes. Thyroid gland, trachea, and esophagus demonstrate no significant findings. Lungs/Pleura: There are minimal patchy ground-glass opacities in the medial left lower lobe and lingula, likely  infectious/inflammatory. Mild emphysema present. The there are few re is a stable peripheral unchanged nodular densities in the left upper lobe measuring up to 4 mm. There is no pleural effusion or pneumothorax. Trachea and central airways are patent. Upper Abdomen: No acute abnormality. Musculoskeletal: No chest wall mass or suspicious bone lesions identified. IMPRESSION: 1. Minimal patchy ground-glass opacities in the medial left lower lobe and lingula, likely infectious/inflammatory. 2. Stable left upper lobe pulmonary nodules measuring up to 4 mm. No follow-up needed if patient is low-risk (and has no known or suspected primary neoplasm). Non-contrast chest CT can be considered in 12 months if patient is high-risk. This recommendation follows the consensus statement: Guidelines for Management of Incidental Pulmonary Nodules Detected on CT Images: From the Fleischner Society 2017; Radiology 2017; 284:228-243. Aortic Atherosclerosis (ICD10-I70.0) and Emphysema (ICD10-J43.9). Electronically Signed   By: Darliss Cheney M.D.   On: 06/11/2023 00:04   CT HEAD WO CONTRAST ( ) Result Date: 06/10/2023 CLINICAL DATA:  Mental status change, unknown cause EXAM: CT HEAD WITHOUT CONTRAST TECHNIQUE: Contiguous axial images were obtained from the base of the skull through the vertex without intravenous contrast. RADIATION DOSE REDUCTION: This exam was performed according to the departmental dose-optimization program which includes automated exposure control, adjustment of the mA and/or kV according to patient size and/or use of  iterative reconstruction technique. COMPARISON:  CT head June 2, 23. FINDINGS: Brain: No evidence of acute infarction, hemorrhage, hydrocephalus, extra-axial collection or mass lesion/mass effect. Vascular: No hyperdense vessel identified. Skull: No acute fracture. Sinuses/Orbits: No acute finding. Other: No mastoid effusions. IMPRESSION: No evidence of acute intracranial abnormality. Electronically Signed   By: Feliberto Harts M.D.   On: 06/10/2023 19:17   DG CHEST PORT 1 VIEW Result Date: 06/10/2023 CLINICAL DATA:  [id number] Acute respiratory failure with hypoxia [facility]) [id number] EXAM: PORTABLE CHEST 1 VIEW COMPARISON:  10/13/2021 FINDINGS: Heart and mediastinal contours are within normal limits. Diffuse interstitial prominence throughout the lungs. No effusions or acute bony abnormality. Aortic atherosclerosis. IMPRESSION: Diffuse interstitial prominence throughout the lungs could reflect chronic interstitial lung disease or atypical infection. Electronically Signed   By: Charlett Nose M.D.   On: 06/10/2023 13:33   MR LUMBAR SPINE WO CONTRAST Result Date: 06/08/2023 CLINICAL DATA:  Initial evaluation for acute compression fracture, lower back pain EXAM: MRI LUMBAR SPINE WITHOUT CONTRAST TECHNIQUE: Multiplanar, multisequence MR imaging of the lumbar spine was performed. No intravenous contrast was administered. COMPARISON:  CT from earlier the same day. FINDINGS: Segmentation:  Examination degraded by motion artifact. Standard segmentation. Lowest well-formed disc space labeled the L5-S1 level. Alignment: Mild straightening of the normal lumbar lordosis with trace dextroscoliosis. No significant listhesis. Vertebrae: Acute to subacute compression fracture involving the superior endplate of L4 with mild 20% height loss and trace 2-3 mm bony retropulsion. This is benign/mechanical in appearance. Marrow edema at the adjacent inferior endplate of L3 favored to be reactive/degenerative. No convincing fracture seen at  this location on prior CT. No other acute or recent fracture. Mild chronic compression deformities involving the superior endplates of T12 and L2 noted. Bone marrow signal intensity mildly heterogeneous but overall within normal limits. No worrisome osseous lesions. Reactive marrow edema present about the bilateral L3-4 facets due to facet arthritis. Postoperative changes from prior lateral screw fixation across the right SI joint noted. Conus medullaris and cauda equina: Conus extends to the L1-2 level. Conus and cauda equina appear normal. Paraspinal and other soft tissues: Mild edema noted within the psoas musculature adjacent to  the L4 fracture. Paraspinous soft tissues demonstrate no other acute finding. Asymmetric right renal atrophy with a few scattered T2 hyperintense cysts, largest of which measures 1.5 cm. These are benign in appearance, no follow-up imaging recommended. Subcentimeter simple cyst noted with subcapsular posterior right hepatic lobe, also likely benign. Disc levels: T12-L1: Minimal disc bulge. Mild bilateral facet hypertrophy. No stenosis. L1-2: Degenerative intervertebral disc space narrowing with diffuse disc bulge and disc desiccation. Superimposed irregular right subarticular to extraforaminal disc protrusion (series 6, image 10). Superimposed reactive endplate spurring. Mild facet hypertrophy. Prominence of the dorsal epidural fat. Mild spinal stenosis. Foramina remain patent. L2-3: Degenerative intervertebral disc space narrowing with diffuse disc bulge and reactive endplate spurring, asymmetric to the right. Moderate facet hypertrophy. Resultant mild spinal stenosis. Mild right L2 foraminal narrowing. Left neural foramina remains patent. L3-4: Disc desiccation with diffuse disc bulge. Possible superimposed right subarticular disc extrusion with superior migration, best seen on axial T2 weighted sequence (series 6, images 22, 21). This is not well seen on corresponding sequences.  Severe bilateral facet arthrosis with reactive marrow edema. Trace 2-3 mm bony retropulsion related to the L4 fracture. Mild epidural lipomatosis. Resultant moderate spinal stenosis. Moderate right with mild left L3 foraminal narrowing. L4-5: Advanced degenerative disc space narrowing with disc desiccation and diffuse disc bulge. Reactive endplate spurring. Moderate facet hypertrophy. Resultant mild bilateral subarticular stenosis. Central canal remains patent. Mild right greater than left L4 foraminal stenosis. L5-S1: Disc desiccation with minimal disc bulge. Small central annular fissure. Moderate left greater than right facet hypertrophy. No significant spinal stenosis. Mild left L5 foraminal narrowing. Right neural foramen remains patent. IMPRESSION: 1. Acute to subacute compression fracture involving the superior endplate of L4 with mild 20% height loss and trace 2-3 mm bony retropulsion. This is benign/mechanical in appearance. 2. Multifactorial degenerative changes at L3-4 with resultant moderate spinal stenosis, with moderate right and mild left L3 foraminal narrowing. Possible superimposed right subarticular disc extrusion with superior migration at this level as above, not entirely certain given motion degradation on this exam. 3. Additional multilevel degenerative spondylosis and facet hypertrophy as above. Resultant mild spinal stenosis at L1-2 and L2-3. 4. Reactive marrow edema about the bilateral L3-4 facets due to facet arthritis. Finding could serve as a source for lower back pain. Electronically Signed   By: Rise Mu M.D.   On: 06/08/2023 17:39   CT Lumbar Spine Wo Contrast Result Date: 06/08/2023 CLINICAL DATA:  63 year old female with low back pain, bilateral leg pain. EXAM: CT LUMBAR SPINE WITHOUT CONTRAST TECHNIQUE: Multidetector CT imaging of the lumbar spine was performed without intravenous contrast administration. Multiplanar CT image reconstructions were also generated.  RADIATION DOSE REDUCTION: This exam was performed according to the departmental dose-optimization program which includes automated exposure control, adjustment of the mA and/or kV according to patient size and/or use of iterative reconstruction technique. COMPARISON:  CT Abdomen and Pelvis 10/14/2021. FINDINGS: Segmentation: Normal. Alignment: Straightening of lumbar lordosis not significantly changed from 2023. No significant scoliosis. Mild chronic retrolisthesis of L1 on L2. Vertebrae: L4 superior endplate compression fracture appears unhealed and comminuted. 30% loss of vertebral body height. Prominent new adjacent vacuum disc at L3-L4. Retropulsion of the posterosuperior endplate up to 6 mm. L4 pedicles and posterior elements appear intact, aligned, chronically degenerated. And degenerative appearing bilateral L4-L5 facet ankylosis appears progressed since 2023. Superimposed mildly displaced right L3 transverse process fracture is new since 2023. Other lumbar vertebrae appear stable since 2023 including mild chronic L2 superior endplate deformity. Underlying  osteopenia. Superimposed chronic right SI joint arthrodesis hardware. Visible sacrum and SI joints appears stable. Paraspinal and other soft tissues: Aortoiliac calcified atherosclerosis. Chronic right renal atrophy. Noncontrast visible abdominal viscera appear stable. Distended urinary bladder. Diverticulosis of the distal large bowel. Disc levels: Stable CT appearance of chronic lumbar spine degeneration since 2023 except L3-L4: Progressed vacuum disc. Posttraumatic mild retropulsion of the L4 posterosuperior endplate and increased multifactorial moderate to severe spinal stenosis at that level (series 4, image 75 now versus series 3, image 38 there in 2023. Superimposed chronic bulky facet arthropathy. Chronic moderate to severe bilateral L3 foraminal stenosis has not significantly changed. IMPRESSION: 1. Acute to subacute: - L4 compression fracture with  30% loss of vertebral body height, mild retropulsion, and subsequent increased moderate to severe multifactorial spinal stenosis at L3-L4. Chronic L3 foraminal stenosis not significantly changed. - right L3 transverse process fracture. 2. Other chronic lumbar spine degeneration otherwise appears stable by CT since 2023. 3. Distended urinary bladder, query Urinary Retention. 4. Aortic Atherosclerosis (ICD10-I70.0). Large bowel diverticulosis. Chronic right renal atrophy. Electronically Signed   By: Odessa Fleming M.D.   On: 06/08/2023 13:01    Kendell Bane, MD  [facility]  Total of 55 minutes of critical care time was spent evaluating and examining the patient.  All records were reviewed including medication, labs, drawn plan of care, discussion with ICU staff  If 7PM-7AM, please contact night-coverage [email or web address] Password [email or web address] 06/14/2023, 11:59 AM   LOS: 6 days

## 2023-06-14 NOTE — Progress Notes (Signed)
Orthopedic Tech Progress Note Patient Details:  Jessica Bell March 03, 1961 [id number] Called in order to [facility] Patient ID: Jessica Bell, female   DOB: 07-04-1960, 63 y.o.   MRN: [id number]  Lovett Calender 06/14/2023, 1:56 PM

## 2023-06-14 NOTE — Plan of Care (Signed)

## 2023-06-14 NOTE — Progress Notes (Signed)
Physical Therapy Treatment Patient Details Name: Jessica Bell MRN: [id number] DOB: [birth year] Today's Date: 06/14/2023   History of Present Illness Jessica Bell is a 63 y.o. female with medical history significant of hypertension, heart failure with preserved EF, hyperlipidemia, coronary artery disease with history of MI, hypothyroidism, chronic pain on chronic opiate therapy, PRES who presents to the emergency department due to low back pain and bilateral leg pain.  Apparently, patient sustained a fall about a week ago and has since been complaining of worsening back pain with radiation to the legs bilaterally and increased difficulty in ambulation.  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  Patient's boyfriend called hospice nurse last night due to difficulty been able to take care of patient at home by himself, EMS was activated and patient was sent to the ED for further evaluation and management.    PT Comments  Reassessment, patient was agreeable to therapy.  Patient required mod/max assist for bed mobility. Had patient roll to side then sit up. Once seated was able to maintain balance with UE/LE support. Using RW and mod/max assist patient was able to perform sit to stand. Was not able to take any steps once standing. Mod assist was provided during patient scooting to the left on the bed. Before getting patient back in bed. Patient was limited due to fatigue. Patient was left in bed with call bell at conclusion of session. Patient will benefit from continued skilled physical therapy in hospital and recommended venue below to increase strength, balance, endurance for safe ADLs and gait.     If plan is discharge home, recommend the following: A lot of help with walking and/or transfers;A lot of help with bathing/dressing/bathroom;Assist for transportation;Help with stairs or ramp for entrance;Assistance with cooking/housework   Can travel by private vehicle     No   Equipment Recommendations  None recommended by PT    Recommendations for Other Services       Precautions / Restrictions Precautions Precautions: Back;Fall Precaution Comments: wearing back brace Required Braces or Orthoses: Spinal Brace Restrictions Weight Bearing Restrictions Per Provider Order: No     Mobility  Bed Mobility Overal bed mobility: Needs Assistance Bed Mobility: Sidelying to Sit, Sit to Supine   Sidelying to sit: Mod assist, Max assist   Sit to supine: Mod assist, Max assist     Patient Response: Cooperative  Transfers Overall transfer level: Needs assistance Equipment used: Rolling walker (2 wheels) Transfers: Sit to/from Stand Sit to Stand: Mod assist, Max assist                Ambulation/Gait                   Stairs             Wheelchair Mobility     Tilt Bed Tilt Bed Patient Response: Cooperative  Modified Rankin (Stroke Patients Only)       Balance Overall balance assessment: Needs assistance Sitting-balance support: Bilateral upper extremity supported, Feet supported Sitting balance-Leahy Scale: Fair     Standing balance support: Bilateral upper extremity supported, Reliant on assistive device for balance Standing balance-Leahy Scale: Poor Standing balance comment: poor/fair                            Cognition Arousal: Alert   Overall Cognitive Status: Within Functional Limits for tasks assessed  Exercises      General Comments        Pertinent Vitals/Pain Pain Assessment Pain Assessment: Faces Faces Pain Scale: Hurts little more Pain Location: back Pain Descriptors / Indicators: Grimacing, Moaning Pain Intervention(s): Limited activity within patient's tolerance, Monitored during session, Repositioned    Home Living                          Prior Function            PT Goals (current goals can now be  found in the care plan section) Acute Rehab PT Goals Patient Stated Goal: return home after going to rehab PT Goal Formulation: With patient Time For Goal Achievement: 06/24/23 Potential to Achieve Goals: Fair Progress towards PT goals: Progressing toward goals    Frequency    Min 3X/week      PT Plan      Co-evaluation              AM-PAC PT "6 Clicks" Mobility   Outcome Measure  Help needed turning from your back to your side while in a flat bed without using bedrails?: A Lot Help needed moving from lying on your back to sitting on the side of a flat bed without using bedrails?: A Lot Help needed moving to and from a bed to a chair (including a wheelchair)?: Total Help needed standing up from a chair using your arms (e.g., wheelchair or bedside chair)?: Total Help needed to walk in hospital room?: Total Help needed climbing 3-5 steps with a railing? : Total 6 Click Score: 8    End of Session   Activity Tolerance: Patient tolerated treatment well;Patient limited by fatigue Patient left: in bed;with call bell/phone within reach Nurse Communication: Mobility status PT Visit Diagnosis: Other abnormalities of gait and mobility (R26.89);Muscle weakness (generalized) (M62.81);History of falling (Z91.81);Unsteadiness on feet (R26.81)     Time: 7829-5621 PT Time Calculation (min) (ACUTE ONLY): 30 min  Charges:    $Therapeutic Activity: 23-37 mins PT General Charges $$ ACUTE PT VISIT: 1 Visit                     Zarian Colpitts SPT

## 2023-06-14 NOTE — TOC Progression Note (Signed)
Transition of Care [facility]) - Progression Note    Patient Details  Name: Jessica Bell MRN: [id number] Date of Birth: 10/05/1960  Transition of Care [facility]) CM/SW Contact  Villa Herb, [place] Phone Number: 06/14/2023, 11:47 AM  Clinical Narrative:    CSW spoke with pts daughter to review bed offers, they accept bed offer at [facility]. CSW updated [facility] and [facility] in admission. Due to pt revoking hospice services, [facility] Medicare will become active again on 2/1. On 2/1 [facility] insurance Berkley Harvey will need to be started for SNF. TOC to follow.   Expected Discharge Plan: Skilled Nursing Facility Barriers to Discharge: Continued Medical Work up  Expected Discharge Plan and Services In-house Referral: Clinical Social Work Discharge Planning Services: CM Consult Post Acute Care Choice: Skilled Nursing Facility Living arrangements for the past 2 months: Single Family Home                                       Social Determinants of Health (SDOH) Interventions SDOH Screenings   Food Insecurity: No Food Insecurity (06/08/2023)  Housing: Low Risk  (06/08/2023)  Transportation Needs: No Transportation Needs (06/08/2023)  Utilities: Not At Risk (06/08/2023)  Financial Resource Strain: Low Risk  (09/05/2021)   Received from [facility], [facility], [facility]  Physical Activity: Inactive (09/05/2021)   [facility], [facility]  Social Connections: Moderately Isolated (06/08/2023)  Stress: Stress Concern Present (09/05/2021)   Received from [facility], [facility], [facility]  Tobacco Use: High Risk (06/08/2023)    Readmission Risk Interventions    06/10/2023   11:38 AM 10/16/2021   11:41 AM 08/24/2021   11:58 AM  Readmission Risk Prevention Plan  Transportation Screening Complete Complete Complete  Home Care Screening Complete    Medication Review (RN CM) Complete    Medication Review Oceanographer)  Complete Complete  HRI or Home Care  Consult  Complete Complete  SW Recovery Care/Counseling Consult  Complete Complete  Palliative Care Screening  Not Applicable Not Applicable  Skilled Nursing Facility  Not Applicable Not Applicable

## 2023-06-14 NOTE — Plan of Care (Signed)
  Problem: Education: Goal: Knowledge of General Education information will improve Description: Including pain rating scale, medication(s)/side effects and non-pharmacologic comfort measures Outcome: Progressing   Problem: Health Behavior/Discharge Planning: Goal: Ability to manage health-related needs will improve Outcome: Not Progressing   Problem: Activity: Goal: Risk for activity intolerance will decrease Outcome: Not Progressing

## 2023-06-14 NOTE — Care Management Important Message (Signed)
Important Message  Patient Details  Name: Jessica Bell MRN: [id number] Date of Birth: [birth year]   Important Message Given:  Yes - Medicare IM     Corey Harold 06/14/2023, 12:07 PM

## 2023-06-15 DIAGNOSIS — S32040G Wedge compression fracture of fourth lumbar vertebra, subsequent encounter for fracture with delayed healing: Secondary | ICD-10-CM | POA: Diagnosis not present

## 2023-06-15 LAB — BASIC METABOLIC PANEL
Anion gap: 9 (ref 5–15)
BUN: 18 mg/dL (ref 8–23)
CO2: 16 mmol/L — ABNORMAL LOW (ref 22–32)
Calcium: 8.2 mg/dL — ABNORMAL LOW (ref 8.9–10.3)
Chloride: 114 mmol/L — ABNORMAL HIGH (ref 98–111)
Creatinine, Ser: 0.78 mg/dL (ref 0.44–1.00)
GFR, Estimated: >60 mL/min (ref >60)
Glucose, Bld: 88 mg/dL (ref 70–99)
Potassium: 3.3 mmol/L — ABNORMAL LOW (ref 3.5–5.1)
Sodium: 139 mmol/L (ref 135–145)

## 2023-06-15 LAB — CULTURE, BLOOD (ROUTINE X 2)
Culture: NO GROWTH
Culture: NO GROWTH
Special Requests: ADEQUATE
Special Requests: ADEQUATE

## 2023-06-15 LAB — MAGNESIUM: Magnesium: 1.9 mg/dL (ref 1.7–2.4)

## 2023-06-15 LAB — CBC
HCT: 34.5 % — ABNORMAL LOW (ref 36.0–46.0)
Hemoglobin: 10.8 g/dL — ABNORMAL LOW (ref 12.0–15.0)
MCH: 26.8 pg (ref 26.0–34.0)
MCHC: 31.3 g/dL (ref 30.0–36.0)
MCV: 85.6 fL (ref 80.0–100.0)
Platelets: 308 10*3/uL (ref 150–400)
RBC: 4.03 MIL/uL (ref 3.87–5.11)
RDW: 15.6 % — ABNORMAL HIGH (ref 11.5–15.5)
WBC: 10.3 10*3/uL (ref 4.0–10.5)
nRBC: 0 % (ref 0.0–0.2)

## 2023-06-15 MED ORDER — LABETALOL HCL 200 MG PO TABS: 200.0000 mg | ORAL_TABLET | Freq: Two times a day (BID) | ORAL | 1 refills | Status: AC

## 2023-06-15 MED ORDER — RISAQUAD PO CAPS: 2.0000 | ORAL_CAPSULE | Freq: Three times a day (TID) | ORAL | 0 refills | Status: AC

## 2023-06-15 MED ORDER — CLOPIDOGREL BISULFATE 75 MG PO TABS: 75.0000 mg | ORAL_TABLET | Freq: Every day | ORAL | 3 refills | Status: AC

## 2023-06-15 MED ORDER — LOSARTAN POTASSIUM 25 MG PO TABS: 25.0000 mg | ORAL_TABLET | Freq: Every day | ORAL | 1 refills | Status: AC

## 2023-06-15 MED ORDER — ALPRAZOLAM 1 MG PO TABS: 1.0000 mg | ORAL_TABLET | Freq: Three times a day (TID) | ORAL | 0 refills | Status: AC | PRN

## 2023-06-15 MED ORDER — FUROSEMIDE 40 MG PO TABS: 20.0000 mg | ORAL_TABLET | Freq: Every day | ORAL | 0 refills | Status: AC

## 2023-06-15 MED ORDER — GABAPENTIN 100 MG PO CAPS: 100.0000 mg | ORAL_CAPSULE | Freq: Three times a day (TID) | ORAL | 1 refills | Status: AC

## 2023-06-15 MED ORDER — POTASSIUM CHLORIDE CRYS ER 20 MEQ PO TBCR
40.0000 meq | EXTENDED_RELEASE_TABLET | Freq: Once | ORAL | Status: AC
  Administered 2023-06-15: 40 meq via ORAL
  Filled 2023-06-15: qty 2

## 2023-06-15 MED ORDER — LEVOFLOXACIN 750 MG PO TABS: 750.0000 mg | ORAL_TABLET | Freq: Every day | ORAL | 0 refills | Status: AC

## 2023-06-15 NOTE — Plan of Care (Signed)

## 2023-06-15 NOTE — Progress Notes (Signed)
Patient has refused ambulation and brace placement all day.

## 2023-06-15 NOTE — Progress Notes (Signed)
Dr. Truitt Merle of diarrhea stools x3 liquid goul smelling no orders at this time

## 2023-06-15 NOTE — Discharge Summary (Signed)
Physician Discharge Summary   Patient: Jessica Bell MRN: [id number] DOB: [birth year]  Admit date:     06/08/2023  Discharge date: 06/16/23  Discharge Physician: Kendell Bane   PCP: Pcp, No   Patient was seen and examined, no further changes to this discharge summary    recommendations at discharge:   Follow-up with PCP in 1 week Continue current recommended medications Aggressive PT OT, fall precautions Follow-up with spine/orthopedic surgeon in 2-4 weeks For pain management schedule and as needed Tylenol, Neurontin may be utilized- and try to avoid narcotics if possible  Discharge Diagnoses: Principal Problem:   Compression fracture of fourth lumbar vertebra (HCC) Active Problems:   PRES (posterior reversible encephalopathy syndrome)   CAD S/P percutaneous coronary angioplasty   Essential hypertension   Gastroesophageal reflux disease   COPD (chronic obstructive pulmonary disease) (HCC)   Acute metabolic encephalopathy   Acquired hypothyroidism   Fall at home, initial encounter   (HFpEF) heart failure with preserved ejection fraction (HCC)   Acute urinary retention   Uncontrolled pain   Aspiration pneumonitis (HCC)  Resolved Problems:   * No resolved hospital problems. *  Hospital Course: Jessica Bell is a  63 year old female with a history of COPD, chronic respiratory failure on 5 L, tobacco abuse, HFpEF, hypertension, PRES, hyperlipidemia, opioid dependence, coronary disease, GERD, hypothyroidism presenting with worsening back pain radiating to bilateral legs.  The patient sustained a mechanical fall about 2 weeks prior to admission.  Since that period time, the patient has had increasing pain.  At baseline, the patient has been ambulating with a walker.  However because of the pain she is having more difficulty.  She is only be able to take a few steps.  Her significant other is having difficulty caring for her.  As result, she was brought to the emergency  department for further evaluation and treatment.  The patient denies any fevers, chills, headache, chest pain, shortness breath, hemoptysis, nausea, vomiting, diarrhea, abdominal pain. Notably, the patient administers her own medications.  She states that she has not been 100% compliant. In the ED, the patient was afebrile hemodynamically stable with oxygen saturation 93 to 96% on 5 L.  WBC 11.5, hemoglobin 11.5, platelets 203.  Sodium 135, potassium 3.6, bicarbonate 34, serum creatinine 0.52.  LFTs were unremarkable.  MRI of the lumbar spine showed acute to subacute compression fracture with 20% loss in height.  There was 2 to 3 mm bony retropulsion.  It appeared benign and mechanical in appearance.  There is multifactorial L3-L4 moderate spinal stenosis with moderate right and mild left L3 foraminal stenosis.  There is reactive marrow edema bilateral L3-4 facets secondary to facet arthritis.  There is mild spinal stenosis L1-2, L2-3. CT lumbar spine had similar findings.  Neurosurgery, Dr. Conchita Paris was consulted and recommended continue medical therapy, pain control, and outpatient follow-up. The patient significant other stated that he could no longer take care of the patient because of the patient's progressive debility.  As result the patient was admitted for further evaluation and treatment.   Acute metabolic Encephalopathy -Resolved - back to baseline  -On this admission we were suspect PRES--wide variations in BP since hospitalization -1/27 CT brain neg -06/12/2023 MRI of the brain reviewed, motion artifact negative for any overt intracranial abnormality -EEG -no seizure activity  -1/28 transfer to ICU, off nicardipine drip-out of ICU 06/13/2023   -7.63/38/58/40  -ammonia 29 -B12 574 -TSH--0.553 -stopped opioids 1/27   -Neurologist-consulted-appreciate input   Possibly due  to polypharmacy, withdrawals from narcotics, benzodiazepines Restarting gabapentin down from 300 to 100 mg p.o.  3 times daily  Discontinued high-dose benzodiazepine, as needed Xanax      Intractable back pain/increased debility/physical deconditioning Compression fracture L4 -TLSO brace -Try to avoid opioids, scheduled and as needed Tylenol, Neurontin may be utilized and titrated up.   We need to adjust narcotics for better mental status evaluation   -PT evaluation>>SNF   Aspiration pneumonitis -Afebrile, no leukocytosis -1/27 CT chest--patchy GGO LLL -merrem starte 1/27 -speech therapy eval   Hypokalemia  -Monitoring and repleting accordingly       urinary retention -Resolved -Foley catheter inserted 06/08/2023-discontinued -UA 11-20WBC -urine culture neg   Pyuria -Follow urine cultures--neg   Tobacco abuse Tobacco cessation discussed Nicoderm patch   Opioid dependence (HCC)-  PDMP reviewed Oxycodone 10 mg, #90--refilled 06/07/23 Morphine ER 15 mg, #30--filled 06/04/23 Morphine concentrate 100mg /5cc--30 cc--filled 05/31/23, 05/20/23 Xanax 1 mg, #45, filled 06/12/23, 05/19/33   Chronic HFpEF Clinically euvolemic -monitor closely sleep with oral intake now 05/11/2021 echo EF 50-55%, G1DD Distal septal HK Continue labetalol, Lasix  COPD (chronic obstructive pulmonary disease) (HCC) As needed bronchodilators -No signs of exacerbation, monitoring   Essential hypertension Carvedilol was discontinue- started labetalol -was  on nicardipine drip - Discontinued    Dyslipidemia Continue statin   CAD S/P percutaneous coronary angioplasty No chest pain presently Continue Plavix and statin   Hypothyroidism Continue synthroid   Obesity BMI 31.07 Lifestyle modification   Disposition: Skilled nursing facility Diet recommendation:  Discharge Diet Orders (From admission, onward)     Start     Ordered   06/15/23 0000  Diet - low sodium heart healthy        06/15/23 1010           Regular diet DISCHARGE MEDICATION: Allergies as of 06/16/2023       Reactions    Duloxetine Hcl Other (See Comments)   Fluoxetine Other (See Comments)   Hydroxyzine Hcl Other (See Comments)   Mirtazapine Other (See Comments)   Nsaids Other (See Comments)   stomach issues   Sertraline Other (See Comments)   Sulfamethoxazole-trimethoprim Diarrhea   Tramadol Other (See Comments)   Amoxicillin-pot Clavulanate Diarrhea, Nausea And Vomiting   Bupropion Other (See Comments)   Lorazepam Anxiety        Medication List     STOP taking these medications    amitriptyline 10 MG tablet Commonly known as: ELAVIL   amLODipine 10 MG tablet Commonly known as: NORVASC   carvedilol 12.5 MG tablet Commonly known as: COREG   dicyclomine 10 MG capsule Commonly known as: BENTYL   haloperidol 5 MG tablet Commonly known as: HALDOL   morphine 15 MG 12 hr tablet Commonly known as: MS CONTIN   Oxycodone HCl 10 MG Tabs   predniSONE 10 MG tablet Commonly known as: DELTASONE       TAKE these medications    acetaminophen 500 MG tablet Commonly known as: TYLENOL Take 1,000 mg by mouth every 8 (eight) hours as needed for mild pain (pain score 1-3).   acidophilus Caps capsule Take 2 capsules by mouth 3 (three) times daily for 5 days.   albuterol 108 (90 Base) MCG/ACT inhaler Commonly known as: VENTOLIN HFA Inhale 1 puff into the lungs every 4 (four) hours as needed for wheezing or shortness of breath.   ALPRAZolam 1 MG tablet Commonly known as: XANAX Take 1 tablet (1 mg total) by mouth 3 (three) times daily as needed  for up to 3 days for anxiety. What changed:  when to take this reasons to take this   atorvastatin 80 MG tablet Commonly known as: LIPITOR Take 80 mg by mouth daily.   clopidogrel 75 MG tablet Commonly known as: PLAVIX Take 1 tablet (75 mg total) by mouth daily.   furosemide 40 MG tablet Commonly known as: LASIX Take 0.5 tablets (20 mg total) by mouth daily. What changed: how much to take   gabapentin 100 MG capsule Commonly known as:  NEURONTIN Take 1 capsule (100 mg total) by mouth 3 (three) times daily. What changed:  medication strength how much to take   guaiFENesin 600 MG 12 hr tablet Commonly known as: MUCINEX Take 600 mg by mouth 2 (two) times daily.   ipratropium-albuterol 0.5-2.5 (3) MG/3ML Soln Commonly known as: DUONEB Take 3 mLs by nebulization every 4 (four) hours as needed (wheezing).   labetalol 200 MG tablet Commonly known as: NORMODYNE Take 1 tablet (200 mg total) by mouth 2 (two) times daily.   levofloxacin 750 MG tablet Commonly known as: LEVAQUIN Take 1 tablet (750 mg total) by mouth daily for 3 days.   levothyroxine 100 MCG tablet Commonly known as: SYNTHROID Take 1 tablet (100 mcg total) by mouth every morning.   losartan 25 MG tablet Commonly known as: COZAAR Take 1 tablet (25 mg total) by mouth daily. What changed:  medication strength how much to take   mouth rinse Liqd solution 15 mLs by Mouth Rinse route as needed (for oral care).   nicotine 21 mg/24hr patch Commonly known as: NICODERM CQ - dosed in mg/24 hours Place 1 patch (21 mg total) onto the skin daily.   omeprazole 20 MG capsule Commonly known as: PRILOSEC Take 1 capsule (20 mg total) by mouth daily.   potassium chloride 10 MEQ tablet Commonly known as: KLOR-CON M Take 10 mEq by mouth See admin instructions. Take with lasix prn   senna 8.6 MG Tabs tablet Commonly known as: SENOKOT Take 1 tablet by mouth 2 (two) times daily.        Contact information for follow-up providers     Schedule an appointment as soon as possible for a visit  with [facility].   Specialty: Neurosurgery Contact information: [address] [place] [address] [phone]         Schedule an appointment as soon as possible for a visit  with McKenzie, Mardene Celeste, MD.   Specialty: Urology Contact information: [address]  [address] [place]  [address] [phone]              Contact information for after-discharge care     Destination     [facility] .   Service: Skilled Nursing Contact information: [address] [place] [address] [phone]                    Discharge Exam: Ceasar Mons Weights   06/08/23 2237 06/11/23 1540 06/13/23 0444  Weight: 82.1 kg 78.5 kg 78.4 kg        General:  AAO x 3,  cooperative, no distress;   HEENT:  Normocephalic, PERRL, otherwise with in Normal limits   Neuro:  CNII-XII intact. , normal motor and sensation, reflexes intact   Lungs:   Clear to auscultation BL, Respirations unlabored,  No wheezes / crackles  Cardio:    S1/S2, RRR, No murmure, No Rubs or Gallops  Abdomen:  Soft, non-tender, bowel sounds active all four quadrants, no guarding or peritoneal signs.  Muscular  skeletal:  Back pain, range of motion limited,  Limited exam -global generalized weaknesses - in bed, able to move all 4 extremities,   2+ pulses,  symmetric, No pitting edema  Skin:  Dry, warm to touch, negative for any Rashes,  Wounds: Please see nursing documentation          Condition at discharge: good  The results of significant diagnostics from this hospitalization (including imaging, microbiology, ancillary and laboratory) are listed below for reference.   Imaging Studies: DG CHEST PORT 1 VIEW Result Date: 06/14/2023 CLINICAL DATA:  Shortness of breath EXAM: PORTABLE CHEST 1 VIEW COMPARISON:  Chest x-ray 06/12/2023 FINDINGS: The heart size and mediastinal contours are within normal limits. Both lungs are clear. The visualized skeletal structures are unremarkable. IMPRESSION: No active disease. Electronically Signed   By: Darliss Cheney M.D.   On: 06/14/2023 00:49   DG CHEST PORT 1 VIEW Result Date: 06/12/2023 CLINICAL DATA:  Weakness.  Leukocytosis. EXAM: PORTABLE CHEST 1 VIEW COMPARISON:  06/10/2023 FINDINGS: Lungs are clear without  airspace disease or pulmonary edema. Heart and mediastinum are within normal limits. Atherosclerotic calcifications at the aortic arch. Trachea is midline. No acute bone abnormality. IMPRESSION: No acute cardiopulmonary disease. Electronically Signed   By: Richarda Overlie M.D.   On: 06/12/2023 15:26   MR BRAIN WO CONTRAST Result Date: 06/12/2023 CLINICAL DATA:  Altered mental status, right gaze preference EXAM: MRI HEAD WITHOUT CONTRAST TECHNIQUE: Multiplanar, multiecho pulse sequences of the brain and surrounding structures were obtained without intravenous contrast. COMPARISON:  06/11/2023 CT head, 09/20/2021 MRI head FINDINGS: Evaluation is limited by motion and the absence of several sequences, as the patient attempted to remove the head coil and exit the scanner. Brain: No restricted diffusion to suggest acute or subacute infarct. Patchy and confluent T2 hyperintense signal in the cortical and subcortical aspects of the right greater than left parietal lobe, right occipital lobe, and bilateral cerebellar hemispheres, with increased involvement of the right parietal and occipital lobes compared to prior exam. No acute hemorrhage, mass, mass effect, or midline shift. No hydrocephalus or extra-axial collection. Vascular: Grossly normal arterial flow voids, although motion limited evaluation. Skull and upper cervical spine: Limited evaluation. No acute finding. Sinuses/Orbits: Limited evaluation.  No acute finding. IMPRESSION: 1. Evaluation is limited by motion and the absence of several sequences, as the patient attempted to remove the head coil and exit the scanner. Within this limitation, T2 hyperintense signal in the right greater than left parietooccipital regions and cerebellum, concerning for PRES. 2. No evidence of acute or subacute infarct. Electronically Signed   By: Wiliam Ke M.D.   On: 06/12/2023 14:51   EEG adult Result Date: 06/11/2023 Charlsie Quest, MD     06/11/2023  5:25 PM Patient Name:  Jessica Bell MRN: [id number] Epilepsy Attending: Charlsie Quest Referring Physician/Provider: Catarina Hartshorn, MD Date: 06/11/2023 Duration: 24.03 mins Patient history: 63yo F with ams. EEG to evaluate for seizure.  Level of alertness: Awake  AEDs during EEG study: None  Technical aspects: This EEG study was done with scalp electrodes positioned according to the 10-20 International system of electrode placement. Electrical activity was acquired at a sampling rate of 500Hz  and reviewed with a high frequency filter of 70Hz  and a low frequency filter of 1Hz . EEG data were recorded continuously and digitally stored.  Description: The posterior dominant rhythm consists of 6  Hz activity of moderate voltage (25-35 uV) seen predominantly in posterior head regions, symmetric and reactive to eye opening and eye closing. EEG showed continuous generalized 5 to 6 Hz theta slowing. Hyperventilation and photic stimulation were not performed.    ABNORMALITY - Continuous slow, generalized  IMPRESSION: This study is suggestive of moderate diffuse encephalopathy.. No seizures or epileptiform discharges were seen throughout the recording.  Priyanka Annabelle Harman   CT HEAD WO CONTRAST ( ) Result Date: 06/11/2023 CLINICAL DATA:  Mental status change, unknown cause. New right gaze preference. Hypertension with systolic blood pressure greater than 200. History of PRES. EXAM: CT HEAD WITHOUT CONTRAST TECHNIQUE: Contiguous axial images were obtained from the base of the skull through the vertex without intravenous contrast. RADIATION DOSE REDUCTION: This exam was performed according to the departmental dose-optimization program which includes automated exposure control, adjustment of the mA and/or kV according to patient size and/or use of iterative reconstruction technique. COMPARISON:  Head CT 06/10/2023 FINDINGS: Brain: There are new moderate-sized regions of hypodensity in the right occipital and right parietal lobes most resembling vasogenic  edema with milder edema in the left occipital lobe. There is also mild edema in the right and possibly left cerebellar hemispheres. No acute intracranial hemorrhage, midline shift, or extra-axial fluid collection is identified. The ventricles are normal in size. Vascular: No hyperdense vessel. Skull: No acute fracture or suspicious osseous lesion. Sinuses/Orbits: Mucosal thickening and small volume fluid in the left sphenoid sinus, unchanged. Clear mastoid air cells. Unremarkable orbits. Other: None. IMPRESSION: New multifocal edema involving the right greater than left posterior cerebral hemispheres and cerebellum most suggestive of recurrent PRES. Brain MRI with and without contrast is recommended for further evaluation. Electronically Signed   By: Sebastian Ache M.D.   On: 06/11/2023 15:56   CT CHEST WO CONTRAST Result Date: 06/11/2023 CLINICAL DATA:  Respiratory illness EXAM: CT CHEST WITHOUT CONTRAST TECHNIQUE: Multidetector CT imaging of the chest was performed following the standard protocol without IV contrast. RADIATION DOSE REDUCTION: This exam was performed according to the departmental dose-optimization program which includes automated exposure control, adjustment of the mA and/or kV according to patient size and/or use of iterative reconstruction technique. COMPARISON:  Chest x-ray same day.  CT of the chest 05/23/2020 FINDINGS: Cardiovascular: No significant vascular findings. Normal heart size. No pericardial effusion. 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 There are atherosclerotic calcifications of the aorta and coronary arteries. Mediastinum/Nodes: No enlarged mediastinal or axillary lymph nodes. Thyroid gland, trachea, and esophagus demonstrate no significant findings. Lungs/Pleura: There are minimal patchy ground-glass opacities in the medial left lower lobe and lingula, likely infectious/inflammatory. Mild emphysema present. The there are few re is a stable peripheral  unchanged nodular densities in the left upper lobe measuring up to 4 mm. There is no pleural effusion or pneumothorax. Trachea and central airways are patent. Upper Abdomen: No acute abnormality. Musculoskeletal: No chest wall mass or suspicious bone lesions identified. IMPRESSION: 1. Minimal patchy ground-glass opacities in the medial left lower lobe and lingula, likely infectious/inflammatory. 2. Stable left upper lobe pulmonary nodules measuring up to 4 mm. No follow-up needed if patient is low-risk (and has no known or suspected primary neoplasm). Non-contrast chest CT can be considered in 12 months if patient is high-risk. This recommendation follows the consensus statement: Guidelines for Management of Incidental Pulmonary Nodules Detected on CT Images: From the Fleischner Society 2017; Radiology 2017; 284:228-243. Aortic Atherosclerosis (ICD10-I70.0) and Emphysema (ICD10-J43.9). Electronically Signed   By: Mcneil Sober.D.  On: 06/11/2023 00:04   CT HEAD WO CONTRAST ( ) Result Date: 06/10/2023 CLINICAL DATA:  Mental status change, unknown cause EXAM: CT HEAD WITHOUT CONTRAST TECHNIQUE: Contiguous axial images were obtained from the base of the skull through the vertex without intravenous contrast. RADIATION DOSE REDUCTION: This exam was performed according to the departmental dose-optimization program which includes automated exposure control, adjustment of the mA and/or kV according to patient size and/or use of iterative reconstruction technique. COMPARISON:  CT head June 2, 23. FINDINGS: Brain: No evidence of acute infarction, hemorrhage, hydrocephalus, extra-axial collection or mass lesion/mass effect. Vascular: No hyperdense vessel identified. Skull: No acute fracture. Sinuses/Orbits: No acute finding. Other: No mastoid effusions. IMPRESSION: No evidence of acute intracranial abnormality. Electronically Signed   By: Feliberto Harts M.D.   On: 06/10/2023 19:17   DG CHEST PORT 1 VIEW Result  Date: 06/10/2023 CLINICAL DATA:  [id number] Acute respiratory failure with hypoxia [facility]) [id number] EXAM: PORTABLE CHEST 1 VIEW COMPARISON:  10/13/2021 FINDINGS: Heart and mediastinal contours are within normal limits. Diffuse interstitial prominence throughout the lungs. No effusions or acute bony abnormality. Aortic atherosclerosis. IMPRESSION: Diffuse interstitial prominence throughout the lungs could reflect chronic interstitial lung disease or atypical infection. Electronically Signed   By: Charlett Nose M.D.   On: 06/10/2023 13:33   MR LUMBAR SPINE WO CONTRAST Result Date: 06/08/2023 CLINICAL DATA:  Initial evaluation for acute compression fracture, lower back pain EXAM: MRI LUMBAR SPINE WITHOUT CONTRAST TECHNIQUE: Multiplanar, multisequence MR imaging of the lumbar spine was performed. No intravenous contrast was administered. COMPARISON:  CT from earlier the same day. FINDINGS: Segmentation:  Examination degraded by motion artifact. Standard segmentation. Lowest well-formed disc space labeled the L5-S1 level. Alignment: Mild straightening of the normal lumbar lordosis with trace dextroscoliosis. No significant listhesis. Vertebrae: Acute to subacute compression fracture involving the superior endplate of L4 with mild 20% height loss and trace 2-3 mm bony retropulsion. This is benign/mechanical in appearance. Marrow edema at the adjacent inferior endplate of L3 favored to be reactive/degenerative. No convincing fracture seen at this location on prior CT. No other acute or recent fracture. Mild chronic compression deformities involving the superior endplates of T12 and L2 noted. Bone marrow signal intensity mildly heterogeneous but overall within normal limits. No worrisome osseous lesions. Reactive marrow edema present about the bilateral L3-4 facets due to facet arthritis. Postoperative changes from prior lateral screw fixation across the right SI joint noted. Conus medullaris and cauda equina: Conus extends to  the L1-2 level. Conus and cauda equina appear normal. Paraspinal and other soft tissues: Mild edema noted within the psoas musculature adjacent to the L4 fracture. Paraspinous soft tissues demonstrate no other acute finding. Asymmetric right renal atrophy with a few scattered T2 hyperintense cysts, largest of which measures 1.5 cm. These are benign in appearance, no follow-up imaging recommended. Subcentimeter simple cyst noted with subcapsular posterior right hepatic lobe, also likely benign. Disc levels: T12-L1: Minimal disc bulge. Mild bilateral facet hypertrophy. No stenosis. L1-2: Degenerative intervertebral disc space narrowing with diffuse disc bulge and disc desiccation. Superimposed irregular right subarticular to extraforaminal disc protrusion (series 6, image 10). Superimposed reactive endplate spurring. Mild facet hypertrophy. Prominence of the dorsal epidural fat. Mild spinal stenosis. Foramina remain patent. L2-3: Degenerative intervertebral disc space narrowing with diffuse disc bulge and reactive endplate spurring, asymmetric to the right. Moderate facet hypertrophy. Resultant mild spinal stenosis. Mild right L2 foraminal narrowing. Left neural foramina remains patent. L3-4: Disc desiccation with diffuse disc bulge. Possible superimposed right subarticular  disc extrusion with superior migration, best seen on axial T2 weighted sequence (series 6, images 22, 21). This is not well seen on corresponding sequences. Severe bilateral facet arthrosis with reactive marrow edema. Trace 2-3 mm bony retropulsion related to the L4 fracture. Mild epidural lipomatosis. Resultant moderate spinal stenosis. Moderate right with mild left L3 foraminal narrowing. L4-5: Advanced degenerative disc space narrowing with disc desiccation and diffuse disc bulge. Reactive endplate spurring. Moderate facet hypertrophy. Resultant mild bilateral subarticular stenosis. Central canal remains patent. Mild right greater than left L4  foraminal stenosis. L5-S1: Disc desiccation with minimal disc bulge. Small central annular fissure. Moderate left greater than right facet hypertrophy. No significant spinal stenosis. Mild left L5 foraminal narrowing. Right neural foramen remains patent. IMPRESSION: 1. Acute to subacute compression fracture involving the superior endplate of L4 with mild 20% height loss and trace 2-3 mm bony retropulsion. This is benign/mechanical in appearance. 2. Multifactorial degenerative changes at L3-4 with resultant moderate spinal stenosis, with moderate right and mild left L3 foraminal narrowing. Possible superimposed right subarticular disc extrusion with superior migration at this level as above, not entirely certain given motion degradation on this exam. 3. Additional multilevel degenerative spondylosis and facet hypertrophy as above. Resultant mild spinal stenosis at L1-2 and L2-3. 4. Reactive marrow edema about the bilateral L3-4 facets due to facet arthritis. Finding could serve as a source for lower back pain. Electronically Signed   By: Rise Mu M.D.   On: 06/08/2023 17:39   CT Lumbar Spine Wo Contrast Result Date: 06/08/2023 CLINICAL DATA:  63 year old female with low back pain, bilateral leg pain. EXAM: CT LUMBAR SPINE WITHOUT CONTRAST TECHNIQUE: Multidetector CT imaging of the lumbar spine was performed without intravenous contrast administration. Multiplanar CT image reconstructions were also generated. RADIATION DOSE REDUCTION: This exam was performed according to the departmental dose-optimization program which includes automated exposure control, adjustment of the mA and/or kV according to patient size and/or use of iterative reconstruction technique. COMPARISON:  CT Abdomen and Pelvis 10/14/2021. FINDINGS: Segmentation: Normal. Alignment: Straightening of lumbar lordosis not significantly changed from 2023. No significant scoliosis. Mild chronic retrolisthesis of L1 on L2. Vertebrae: L4  superior endplate compression fracture appears unhealed and comminuted. 30% loss of vertebral body height. Prominent new adjacent vacuum disc at L3-L4. Retropulsion of the posterosuperior endplate up to 6 mm. L4 pedicles and posterior elements appear intact, aligned, chronically degenerated. And degenerative appearing bilateral L4-L5 facet ankylosis appears progressed since 2023. Superimposed mildly displaced right L3 transverse process fracture is new since 2023. Other lumbar vertebrae appear stable since 2023 including mild chronic L2 superior endplate deformity. Underlying osteopenia. Superimposed chronic right SI joint arthrodesis hardware. Visible sacrum and SI joints appears stable. Paraspinal and other soft tissues: Aortoiliac calcified atherosclerosis. Chronic right renal atrophy. Noncontrast visible abdominal viscera appear stable. Distended urinary bladder. Diverticulosis of the distal large bowel. Disc levels: Stable CT appearance of chronic lumbar spine degeneration since 2023 except L3-L4: Progressed vacuum disc. Posttraumatic mild retropulsion of the L4 posterosuperior endplate and increased multifactorial moderate to severe spinal stenosis at that level (series 4, image 75 now versus series 3, image 38 there in 2023. Superimposed chronic bulky facet arthropathy. Chronic moderate to severe bilateral L3 foraminal stenosis has not significantly changed. IMPRESSION: 1. Acute to subacute: - L4 compression fracture with 30% loss of vertebral body height, mild retropulsion, and subsequent increased moderate to severe multifactorial spinal stenosis at L3-L4. Chronic L3 foraminal stenosis not significantly changed. - right L3 transverse process fracture. 2. Other  chronic lumbar spine degeneration otherwise appears stable by CT since 2023. 3. Distended urinary bladder, query Urinary Retention. 4. Aortic Atherosclerosis (ICD10-I70.0). Large bowel diverticulosis. Chronic right renal atrophy. Electronically Signed    By: Odessa Fleming M.D.   On: 06/08/2023 13:01    Microbiology: Results for orders placed or performed during the hospital encounter of 06/08/23  Urine Culture     Status: None   Collection Time: 06/08/23  7:04 PM   Specimen: Urine, Clean Catch  Result Value Ref Range Status   Specimen Description   Final    URINE, CLEAN CATCH Performed at [facility], [address], [place], [place] [address]    Special Requests   Final    NONE Performed at [facility], [address] [place], [place] [address]    Culture   Final    NO GROWTH Performed at [facility], [address] [place], [place] [address]    Report Status 06/10/2023 FINAL  Final  Culture, blood (Routine X 2) w Reflex to ID Panel     Status: None   Collection Time: 06/10/23  2:14 PM   Specimen: BLOOD  Result Value Ref Range Status   Specimen Description BLOOD BLOOD RIGHT ARM  Final   Special Requests   Final    BOTTLES DRAWN AEROBIC AND ANAEROBIC Blood Culture adequate volume   Culture   Final    NO GROWTH 5 DAYS Performed at [facility], [address], [place], [place] [address]    Report Status 06/15/2023 FINAL  Final  Culture, blood (Routine X 2) w Reflex to ID Panel     Status: None   Collection Time: 06/10/23  2:17 PM   Specimen: BLOOD  Result Value Ref Range Status   Specimen Description BLOOD BLOOD LEFT ARM  Final   Special Requests   Final    Blood Culture adequate volume BOTTLES DRAWN AEROBIC AND ANAEROBIC   Culture   Final    NO GROWTH 5 DAYS Performed at [facility], [address], [place], [place] [address]    Report Status 06/15/2023 FINAL  Final  MRSA Next Gen by PCR, Nasal     Status: None   Collection Time: 06/11/23  3:42 PM   Specimen: Nasal Mucosa; Nasal Swab  Result Value Ref Range Status   MRSA by PCR Next Gen NOT DETECTED NOT DETECTED Final    Comment: (NOTE) The GeneXpert MRSA Assay (FDA approved for NASAL specimens only), is one component of a comprehensive MRSA colonization  surveillance program. It is not intended to diagnose MRSA infection nor to guide or monitor treatment for MRSA infections. Test performance is not FDA approved in patients less than 42 years old. Performed at [facility], [address], [place], [place] [address]     Labs: CBC: Recent Labs  Lab 06/11/23 [phone] 06/12/23 0513 06/13/23 0419 06/14/23 0520 06/15/23 0457  WBC 13.5* 18.8* 15.9* 10.3 10.3  HGB 12.7 13.4 12.9 11.9* 10.8*  HCT 37.5 39.4 38.9 36.0 34.5*  MCV 82.2 81.7 83.1 85.3 85.6  PLT 395 372 368 312 308   Basic Metabolic Panel: Recent Labs  Lab 06/10/23 1414 06/11/23 0428 06/12/23 0513 06/13/23 0419 06/14/23 0520 06/15/23 0457  NA 131* 137 138 141 140 139  K 2.6* 3.3* 3.1* 3.0* 3.3* 3.3*  CL 87* 98 104 114* 114* 114*  CO2 31 26 21* 15* 16* 16*  GLUCOSE 127* 123* 136* 105* 89 88  BUN 7* 8 6* 9 16  18  CREATININE 0.70 0.74 0.64 0.61 0.84 0.78  CALCIUM 8.9 9.0 8.9 8.4* 8.2* 8.2*  MG 2.2 2.0 1.6*  --   --  1.9   Liver Function Tests: No results for input(s): "AST", "ALT", "ALKPHOS", "BILITOT", "PROT", "ALBUMIN" in the last 168 hours.  CBG: Recent Labs  Lab 06/11/23 1606  GLUCAP 111*    Discharge time spent: greater than 40  minutes.  Signed: Kendell Bane, MD [facility] 06/16/2023

## 2023-06-15 NOTE — Plan of Care (Signed)
  Problem: Education: Goal: Knowledge of General Education information will improve Description: Including pain rating scale, medication(s)/side effects and non-pharmacologic comfort measures 06/15/2023 1159 by Karolee Ohs, RN Outcome: Adequate for Discharge 06/15/2023 1158 by Karolee Ohs, RN Outcome: Progressing   Problem: Health Behavior/Discharge Planning: Goal: Ability to manage health-related needs will improve 06/15/2023 1159 by Karolee Ohs, RN Outcome: Adequate for Discharge 06/15/2023 1158 by Karolee Ohs, RN Outcome: Progressing   Problem: Clinical Measurements: Goal: Ability to maintain clinical measurements within normal limits will improve 06/15/2023 1159 by Karolee Ohs, RN Outcome: Adequate for Discharge 06/15/2023 1158 by Karolee Ohs, RN Outcome: Progressing Goal: Will remain free from infection Outcome: Adequate for Discharge Goal: Diagnostic test results will improve Outcome: Adequate for Discharge Goal: Respiratory complications will improve Outcome: Adequate for Discharge Goal: Cardiovascular complication will be avoided Outcome: Adequate for Discharge   Problem: Activity: Goal: Risk for activity intolerance will decrease Outcome: Adequate for Discharge   Problem: Nutrition: Goal: Adequate nutrition will be maintained Outcome: Adequate for Discharge   Problem: Coping: Goal: Level of anxiety will decrease Outcome: Adequate for Discharge   Problem: Elimination: Goal: Will not experience complications related to bowel motility Outcome: Adequate for Discharge Goal: Will not experience complications related to urinary retention Outcome: Adequate for Discharge   Problem: Pain Managment: Goal: General experience of comfort will improve and/or be controlled Outcome: Adequate for Discharge   Problem: Safety: Goal: Ability to remain free from injury will improve Outcome: Adequate for Discharge   Problem: Skin Integrity: Goal: Risk for impaired  skin integrity will decrease Outcome: Adequate for Discharge

## 2023-06-15 NOTE — TOC Progression Note (Signed)
Transition of Care [facility]) - Progression Note    Patient Details  Name: Jessica Bell MRN: [id number] Date of Birth: Jun 09, 1960  [facility] CM/SW Contact  Otelia Santee, LCSW Phone Number: 06/15/2023, 11:13 AM  Clinical Narrative:    Insurance auth requested for SNF. Currently pending.    Expected Discharge Plan: Skilled Nursing Facility Barriers to Discharge: Continued Medical Work up  Expected Discharge Plan and Services In-house Referral: Clinical Social Work Discharge Planning Services: CM Consult Post Acute Care Choice: Skilled Nursing Facility Living arrangements for the past 2 months: Single Family Home Expected Discharge Date: 06/15/23                                     Social Determinants of Health (SDOH) Interventions SDOH Screenings   Food Insecurity: No Food Insecurity (06/08/2023)  Housing: Low Risk  (06/08/2023)  Transportation Needs: No Transportation Needs (06/08/2023)  Utilities: Not At Risk (06/08/2023)  Financial Resource Strain: Low Risk  (09/05/2021)   Received from [facility], [facility], [facility]  Physical Activity: Inactive (09/05/2021)   [facility], [facility], [facility]  Social Connections: Moderately Isolated (06/08/2023)  Stress: Stress Concern Present (09/05/2021)   Received from [facility], [facility], [facility]  Tobacco Use: High Risk (06/08/2023)    Readmission Risk Interventions    06/10/2023   11:38 AM 10/16/2021   11:41 AM 08/24/2021   11:58 AM  Readmission Risk Prevention Plan  Transportation Screening Complete Complete Complete  Home Care Screening Complete    Medication Review (RN CM) Complete    Medication Review Oceanographer)  Complete Complete  HRI or Home Care Consult  Complete Complete  SW Recovery Care/Counseling Consult  Complete Complete  Palliative Care Screening  Not Applicable Not Applicable  Skilled Nursing Facility  Not Applicable Not Applicable

## 2023-06-16 DIAGNOSIS — S32040G Wedge compression fracture of fourth lumbar vertebra, subsequent encounter for fracture with delayed healing: Secondary | ICD-10-CM | POA: Diagnosis not present

## 2023-06-16 MED ORDER — MAGNESIUM SULFATE 2 GM/50ML IV SOLN
2.0000 g | Freq: Once | INTRAVENOUS | Status: AC
  Administered 2023-06-16: 2 g via INTRAVENOUS
  Filled 2023-06-16: qty 50

## 2023-06-16 MED ORDER — POTASSIUM CHLORIDE CRYS ER 20 MEQ PO TBCR
40.0000 meq | EXTENDED_RELEASE_TABLET | Freq: Once | ORAL | Status: AC
  Administered 2023-06-16: 40 meq via ORAL
  Filled 2023-06-16: qty 2

## 2023-06-16 NOTE — TOC Transition Note (Signed)
Transition of Care [facility]) - Discharge Note   Patient Details  Name: Jessica Bell MRN: [id number] Date of Birth: [birth year]  [facility]) CM/SW Contact:  Karn Cassis, LCSW Phone Number: 06/16/2023, 12:54 PM   Clinical Narrative:  Pt d/c today to [facility]. Pt aware and agreeable and states she will notify family. Auth received. D/C summary sent to SNF. Pt and RN feel pt will need EMS. LCSW to arrange with [facility]. Facility requires pt to be in building by 9PM. RN aware to pass on to night shift that pt will need to stay in hospital if not at SNF by this time. RN given number to call report.     Final next level of care: Skilled Nursing Facility Barriers to Discharge: Barriers Resolved   Patient Goals and CMS Choice Patient states their goals for this hospitalization and ongoing recovery are:: go to SNF CMS Medicare.gov Compare Post Acute Care list provided to:: Patient Choice offered to / list presented to : Patient [place] ownership interest in [facility].provided to:: Patient    Discharge Placement              Patient chooses bed at: Other - please specify in the comment section below: [facility]) Patient to be transferred to facility by: [facility] Name of family member notified: pt states she will notify family Patient and family notified of of transfer: 06/16/23  Discharge Plan and Services Additional resources added to the After Visit Summary for   In-house Referral: Clinical Social Work Discharge Planning Services: CM Consult Post Acute Care Choice: Skilled Nursing Facility                               Social Drivers of Health (SDOH) Interventions SDOH Screenings   Food Insecurity: No Food Insecurity (06/08/2023)  Housing: Low Risk  (06/08/2023)  Transportation Needs: No Transportation Needs (06/08/2023)  Utilities: Not At Risk (06/08/2023)  Financial Resource Strain: Low Risk  (09/05/2021)   Received from  [facility], [facility], [facility]  Physical Activity: Inactive (09/05/2021)   [facility]  Social Connections: Moderately Isolated (06/08/2023)  Stress: Stress Concern Present (09/05/2021)   Received from [facility], [facility], [facility]  Tobacco Use: High Risk (06/08/2023)     Readmission Risk Interventions    06/10/2023   11:38 AM 10/16/2021   11:41 AM 08/24/2021   11:58 AM  Readmission Risk Prevention Plan  Transportation Screening Complete Complete Complete  Home Care Screening Complete    Medication Review (RN CM) Complete    Medication Review Oceanographer)  Complete Complete  HRI or Home Care Consult  Complete Complete  SW Recovery Care/Counseling Consult  Complete Complete  Palliative Care Screening  Not Applicable Not Applicable  Skilled Nursing Facility  Not Applicable Not Applicable

## 2023-06-16 NOTE — Progress Notes (Signed)
Report given to Nurse Marylene Land to [facility].

## 2023-06-16 NOTE — Progress Notes (Signed)
Attempted to call report to facility x3 no answer

## 2023-06-16 NOTE — Progress Notes (Addendum)
Physician Discharge Summary   Patient: Jessica Bell MRN: [id number] DOB: 01/24/1961  Admit date:     06/08/2023  Discharge date: 06/16/23  Discharge Physician: Kendell Bane   PCP: Pcp, No   The patient was seen and examined, stable for discharge to SNF. Medication, labs reviewed..  Discharge medication modified. -Pending authorization.  Recommendations at discharge:   Follow-up with PCP in 1 week Continue current recommended medications Aggressive PT OT, fall precautions Follow-up with spine/orthopedic surgeon in 2-4 weeks  Discharge Diagnoses: Principal Problem:   Compression fracture of fourth lumbar vertebra (HCC) Active Problems:   PRES (posterior reversible encephalopathy syndrome)   CAD S/P percutaneous coronary angioplasty   Essential hypertension   Gastroesophageal reflux disease   COPD (chronic obstructive pulmonary disease) (HCC)   Acute metabolic encephalopathy   Acquired hypothyroidism   Fall at home, initial encounter   (HFpEF) heart failure with preserved ejection fraction (HCC)   Acute urinary retention   Uncontrolled pain   Aspiration pneumonitis (HCC)  Resolved Problems:   * No resolved hospital problems. *  Hospital Course: BONETTA MOSTEK is a  63 year old female with a history of COPD, chronic respiratory failure on 5 L, tobacco abuse, HFpEF, hypertension, PRES, hyperlipidemia, opioid dependence, coronary disease, GERD, hypothyroidism presenting with worsening back pain radiating to bilateral legs.  The patient sustained a mechanical fall about 2 weeks prior to admission.  Since that period time, the patient has had increasing pain.  At baseline, the patient has been ambulating with a walker.  However because of the pain she is having more difficulty.  She is only be able to take a few steps.  Her significant other is having difficulty caring for her.  As result, she was brought to the emergency department for further evaluation and treatment.  The  patient denies any fevers, chills, headache, chest pain, shortness breath, hemoptysis, nausea, vomiting, diarrhea, abdominal pain. Notably, the patient administers her own medications.  She states that she has not been 100% compliant. In the ED, the patient was afebrile hemodynamically stable with oxygen saturation 93 to 96% on 5 L.  WBC 11.5, hemoglobin 11.5, platelets 203.  Sodium 135, potassium 3.6, bicarbonate 34, serum creatinine 0.52.  LFTs were unremarkable.  MRI of the lumbar spine showed acute to subacute compression fracture with 20% loss in height.  There was 2 to 3 mm bony retropulsion.  It appeared benign and mechanical in appearance.  There is multifactorial L3-L4 moderate spinal stenosis with moderate right and mild left L3 foraminal stenosis.  There is reactive marrow edema bilateral L3-4 facets secondary to facet arthritis.  There is mild spinal stenosis L1-2, L2-3. CT lumbar spine had similar findings.  Neurosurgery, Dr. Conchita Paris was consulted and recommended continue medical therapy, pain control, and outpatient follow-up. The patient significant other stated that he could no longer take care of the patient because of the patient's progressive debility.  As result the patient was admitted for further evaluation and treatment.   Acute metabolic Encephalopathy -Resolved - back to baseline  -On this admission we were suspect PRES--wide variations in BP since hospitalization -1/27 CT brain neg -06/12/2023 MRI of the brain reviewed, motion artifact negative for any overt intracranial abnormality -EEG -no seizure activity  -1/28 transfer to ICU, off nicardipine drip-out of ICU 06/13/2023   -7.63/38/58/40  -ammonia 29 -B12 574 -TSH--0.553 -stopped opioids 1/27   -Neurologist-consulted-appreciate input   Possibly due to polypharmacy, withdrawals from narcotics, benzodiazepines Restarting gabapentin down from 300 to 100  mg p.o. 3 times daily  Discontinued high-dose benzodiazepine,  as needed Xanax      Intractable back pain/increased debility/physical deconditioning Compression fracture L4 -TLSO brace -Judicious opioids--continue IV Dilaudid every 3 hours as needed  We need to adjust narcotics for better mental status evaluation   -PT evaluation>>SNF   Aspiration pneumonitis -Afebrile, no leukocytosis -1/27 CT chest--patchy GGO LLL -merrem starte 1/27 -speech therapy eval   Hypokalemia  -Monitoring and repleting accordingly       urinary retention -Resolved -Foley catheter inserted 06/08/2023-discontinued -UA 11-20WBC -urine culture neg   Pyuria -Follow urine cultures--neg   Tobacco abuse Tobacco cessation discussed Nicoderm patch   Opioid dependence (HCC)-  PDMP reviewed Oxycodone 10 mg, #90--refilled 06/07/23 Morphine ER 15 mg, #30--filled 06/04/23 Morphine concentrate 100mg /5cc--30 cc--filled 05/31/23, 05/20/23 Xanax 1 mg, #45, filled 06/12/23, 05/19/33   Chronic HFpEF Clinically euvolemic -monitor closely sleep with oral intake now 05/11/2021 echo EF 50-55%, G1DD Distal septal HK Continue labetalol, Lasix  COPD (chronic obstructive pulmonary disease) (HCC) As needed bronchodilators -No signs of exacerbation, monitoring   Essential hypertension Carvedilol was discontinue- started labetalol -was  on nicardipine drip - Discontinued    Dyslipidemia Continue statin   CAD S/P percutaneous coronary angioplasty No chest pain presently Continue Plavix and statin   Hypothyroidism Continue synthroid   Obesity BMI 31.07 Lifestyle modification   Disposition: Skilled nursing facility Diet recommendation:  Discharge Diet Orders (From admission, onward)     Start     Ordered   06/15/23 0000  Diet - low sodium heart healthy        06/15/23 1010           Regular diet DISCHARGE MEDICATION: Allergies as of 06/16/2023       Reactions   Duloxetine Hcl Other (See Comments)   Fluoxetine Other (See Comments)   Hydroxyzine Hcl Other  (See Comments)   Mirtazapine Other (See Comments)   Nsaids Other (See Comments)   stomach issues   Sertraline Other (See Comments)   Sulfamethoxazole-trimethoprim Diarrhea   Tramadol Other (See Comments)   Amoxicillin-pot Clavulanate Diarrhea, Nausea And Vomiting   Bupropion Other (See Comments)   Lorazepam Anxiety        Medication List     STOP taking these medications    amitriptyline 10 MG tablet Commonly known as: ELAVIL   amLODipine 10 MG tablet Commonly known as: NORVASC   carvedilol 12.5 MG tablet Commonly known as: COREG   dicyclomine 10 MG capsule Commonly known as: BENTYL   haloperidol 5 MG tablet Commonly known as: HALDOL   morphine 15 MG 12 hr tablet Commonly known as: MS CONTIN   Oxycodone HCl 10 MG Tabs   predniSONE 10 MG tablet Commonly known as: DELTASONE       TAKE these medications    acetaminophen 500 MG tablet Commonly known as: TYLENOL Take 1,000 mg by mouth every 8 (eight) hours as needed for mild pain (pain score 1-3).   acidophilus Caps capsule Take 2 capsules by mouth 3 (three) times daily for 5 days.   albuterol 108 (90 Base) MCG/ACT inhaler Commonly known as: VENTOLIN HFA Inhale 1 puff into the lungs every 4 (four) hours as needed for wheezing or shortness of breath.   ALPRAZolam 1 MG tablet Commonly known as: XANAX Take 1 tablet (1 mg total) by mouth 3 (three) times daily as needed for up to 3 days for anxiety. What changed:  when to take this reasons to take this   atorvastatin  80 MG tablet Commonly known as: LIPITOR Take 80 mg by mouth daily.   clopidogrel 75 MG tablet Commonly known as: PLAVIX Take 1 tablet (75 mg total) by mouth daily.   furosemide 40 MG tablet Commonly known as: LASIX Take 0.5 tablets (20 mg total) by mouth daily. What changed: how much to take   gabapentin 100 MG capsule Commonly known as: NEURONTIN Take 1 capsule (100 mg total) by mouth 3 (three) times daily. What changed:   medication strength how much to take   guaiFENesin 600 MG 12 hr tablet Commonly known as: MUCINEX Take 600 mg by mouth 2 (two) times daily.   ipratropium-albuterol 0.5-2.5 (3) MG/3ML Soln Commonly known as: DUONEB Take 3 mLs by nebulization every 4 (four) hours as needed (wheezing).   labetalol 200 MG tablet Commonly known as: NORMODYNE Take 1 tablet (200 mg total) by mouth 2 (two) times daily.   levofloxacin 750 MG tablet Commonly known as: LEVAQUIN Take 1 tablet (750 mg total) by mouth daily for 3 days.   levothyroxine 100 MCG tablet Commonly known as: SYNTHROID Take 1 tablet (100 mcg total) by mouth every morning.   losartan 25 MG tablet Commonly known as: COZAAR Take 1 tablet (25 mg total) by mouth daily. What changed:  medication strength how much to take   mouth rinse Liqd solution 15 mLs by Mouth Rinse route as needed (for oral care).   nicotine 21 mg/24hr patch Commonly known as: NICODERM CQ - dosed in mg/24 hours Place 1 patch (21 mg total) onto the skin daily.   omeprazole 20 MG capsule Commonly known as: PRILOSEC Take 1 capsule (20 mg total) by mouth daily.   potassium chloride 10 MEQ tablet Commonly known as: KLOR-CON M Take 10 mEq by mouth See admin instructions. Take with lasix prn   senna 8.6 MG Tabs tablet Commonly known as: SENOKOT Take 1 tablet by mouth 2 (two) times daily.        Contact information for follow-up providers     Schedule an appointment as soon as possible for a visit  with [facility].   Specialty: Neurosurgery Contact information: [address] [place] [address] [phone]         Schedule an appointment as soon as possible for a visit  with McKenzie, Mardene Celeste, MD.   Specialty: Urology Contact information: [address]  [address] [address] [phone]              Contact information for after-discharge care     Destination      [facility] .   Service: Skilled Nursing Contact information: [address] [place] [address] [phone]                    Discharge Exam: Ceasar Mons Weights   06/08/23 2237 06/11/23 1540 06/13/23 0444  Weight: 82.1 kg 78.5 kg 78.4 kg            General:  AAO x 3,  cooperative, no distress;   HEENT:  Normocephalic, PERRL, otherwise with in Normal limits   Neuro:  CNII-XII intact. , normal motor and sensation, reflexes intact   Lungs:   Clear to auscultation BL, Respirations unlabored,  No wheezes / crackles  Cardio:    S1/S2, RRR, No murmure, No Rubs or Gallops   Abdomen:  Soft, non-tender, bowel sounds active all four quadrants, no guarding or peritoneal signs.  Muscular  skeletal:  Limited exam -global generalized weaknesses - in bed, able to move all 4 extremities,   2+ pulses,  symmetric, No pitting edema  Skin:  Dry, warm to touch, negative for any Rashes,  Wounds: Please see nursing documentation             Condition at discharge: good  The results of significant diagnostics from this hospitalization (including imaging, microbiology, ancillary and laboratory) are listed below for reference.   Imaging Studies: DG CHEST PORT 1 VIEW Result Date: 06/14/2023 CLINICAL DATA:  Shortness of breath EXAM: PORTABLE CHEST 1 VIEW COMPARISON:  Chest x-ray 06/12/2023 FINDINGS: The heart size and mediastinal contours are within normal limits. Both lungs are clear. The visualized skeletal structures are unremarkable. IMPRESSION: No active disease. Electronically Signed   By: Darliss Cheney M.D.   On: 06/14/2023 00:49   DG CHEST PORT 1 VIEW Result Date: 06/12/2023 CLINICAL DATA:  Weakness.  Leukocytosis. EXAM: PORTABLE CHEST 1 VIEW COMPARISON:  06/10/2023 FINDINGS: Lungs are clear without airspace disease or pulmonary edema. Heart and mediastinum are within normal limits. Atherosclerotic calcifications at the aortic arch.  Trachea is midline. No acute bone abnormality. IMPRESSION: No acute cardiopulmonary disease. Electronically Signed   By: Richarda Overlie M.D.   On: 06/12/2023 15:26   MR BRAIN WO CONTRAST Result Date: 06/12/2023 CLINICAL DATA:  Altered mental status, right gaze preference EXAM: MRI HEAD WITHOUT CONTRAST TECHNIQUE: Multiplanar, multiecho pulse sequences of the brain and surrounding structures were obtained without intravenous contrast. COMPARISON:  06/11/2023 CT head, 09/20/2021 MRI head FINDINGS: Evaluation is limited by motion and the absence of several sequences, as the patient attempted to remove the head coil and exit the scanner. Brain: No restricted diffusion to suggest acute or subacute infarct. Patchy and confluent T2 hyperintense signal in the cortical and subcortical aspects of the right greater than left parietal lobe, right occipital lobe, and bilateral cerebellar hemispheres, with increased involvement of the right parietal and occipital lobes compared to prior exam. No acute hemorrhage, mass, mass effect, or midline shift. No hydrocephalus or extra-axial collection. Vascular: Grossly normal arterial flow voids, although motion limited evaluation. Skull and upper cervical spine: Limited evaluation. No acute finding. Sinuses/Orbits: Limited evaluation.  No acute finding. IMPRESSION: 1. Evaluation is limited by motion and the absence of several sequences, as the patient attempted to remove the head coil and exit the scanner. Within this limitation, T2 hyperintense signal in the right greater than left parietooccipital regions and cerebellum, concerning for PRES. 2. No evidence of acute or subacute infarct. Electronically Signed   By: Wiliam Ke M.D.   On: 06/12/2023 14:51   EEG adult Result Date: 06/11/2023 Charlsie Quest, MD     06/11/2023  5:25 PM Patient Name: Jessica Bell MRN: [id number] Epilepsy Attending: Charlsie Quest Referring Physician/Provider: Catarina Hartshorn, MD Date: 06/11/2023 Duration:  24.03 mins Patient history: 63yo F with ams. EEG to evaluate for seizure.  Level of alertness: Awake  AEDs during EEG study: None  Technical aspects: This EEG study was done with scalp electrodes positioned according to the 10-20 International system of electrode placement. Electrical activity was acquired at a sampling rate of 500Hz  and reviewed with a high frequency filter of 70Hz  and a low frequency filter of 1Hz . EEG data were recorded continuously and digitally stored.  Description: The posterior dominant rhythm consists of 6 Hz activity of moderate voltage (25-35 uV) seen predominantly in posterior head regions, symmetric and reactive to eye opening and  eye closing. EEG showed continuous generalized 5 to 6 Hz theta slowing. Hyperventilation and photic stimulation were not performed.    ABNORMALITY - Continuous slow, generalized  IMPRESSION: This study is suggestive of moderate diffuse encephalopathy.. No seizures or epileptiform discharges were seen throughout the recording.  Priyanka Annabelle Harman   CT HEAD WO CONTRAST ( ) Result Date: 06/11/2023 CLINICAL DATA:  Mental status change, unknown cause. New right gaze preference. Hypertension with systolic blood pressure greater than 200. History of PRES. EXAM: CT HEAD WITHOUT CONTRAST TECHNIQUE: Contiguous axial images were obtained from the base of the skull through the vertex without intravenous contrast. RADIATION DOSE REDUCTION: This exam was performed according to the departmental dose-optimization program which includes automated exposure control, adjustment of the mA and/or kV according to patient size and/or use of iterative reconstruction technique. COMPARISON:  Head CT 06/10/2023 FINDINGS: Brain: There are new moderate-sized regions of hypodensity in the right occipital and right parietal lobes most resembling vasogenic edema with milder edema in the left occipital lobe. There is also mild edema in the right and possibly left cerebellar hemispheres. No  acute intracranial hemorrhage, midline shift, or extra-axial fluid collection is identified. The ventricles are normal in size. Vascular: No hyperdense vessel. Skull: No acute fracture or suspicious osseous lesion. Sinuses/Orbits: Mucosal thickening and small volume fluid in the left sphenoid sinus, unchanged. Clear mastoid air cells. Unremarkable orbits. Other: None. IMPRESSION: New multifocal edema involving the right greater than left posterior cerebral hemispheres and cerebellum most suggestive of recurrent PRES. Brain MRI with and without contrast is recommended for further evaluation. Electronically Signed   By: Sebastian Ache M.D.   On: 06/11/2023 15:56   CT CHEST WO CONTRAST Result Date: 06/11/2023 CLINICAL DATA:  Respiratory illness EXAM: CT CHEST WITHOUT CONTRAST TECHNIQUE: Multidetector CT imaging of the chest was performed following the standard protocol without IV contrast. RADIATION DOSE REDUCTION: This exam was performed according to the departmental dose-optimization program which includes automated exposure control, adjustment of the mA and/or kV according to patient size and/or use of iterative reconstruction technique. COMPARISON:  Chest x-ray same day.  CT of the chest 05/23/2020 FINDINGS: Cardiovascular: No significant vascular findings. Normal heart size. No pericardial effusion. 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 There are atherosclerotic calcifications of the aorta and coronary arteries. Mediastinum/Nodes: No enlarged mediastinal or axillary lymph nodes. Thyroid gland, trachea, and esophagus demonstrate no significant findings. Lungs/Pleura: There are minimal patchy ground-glass opacities in the medial left lower lobe and lingula, likely infectious/inflammatory. Mild emphysema present. The there are few re is a stable peripheral unchanged nodular densities in the left upper lobe measuring up to 4 mm. There is no pleural effusion or pneumothorax. Trachea and central  airways are patent. Upper Abdomen: No acute abnormality. Musculoskeletal: No chest wall mass or suspicious bone lesions identified. IMPRESSION: 1. Minimal patchy ground-glass opacities in the medial left lower lobe and lingula, likely infectious/inflammatory. 2. Stable left upper lobe pulmonary nodules measuring up to 4 mm. No follow-up needed if patient is low-risk (and has no known or suspected primary neoplasm). Non-contrast chest CT can be considered in 12 months if patient is high-risk. This recommendation follows the consensus statement: Guidelines for Management of Incidental Pulmonary Nodules Detected on CT Images: From the Fleischner Society 2017; Radiology 2017; 284:228-243. Aortic Atherosclerosis (ICD10-I70.0) and Emphysema (ICD10-J43.9). Electronically Signed   By: Darliss Cheney M.D.   On: 06/11/2023 00:04   CT HEAD WO CONTRAST ( ) Result Date: 06/10/2023 CLINICAL DATA:  Mental status  change, unknown cause EXAM: CT HEAD WITHOUT CONTRAST TECHNIQUE: Contiguous axial images were obtained from the base of the skull through the vertex without intravenous contrast. RADIATION DOSE REDUCTION: This exam was performed according to the departmental dose-optimization program which includes automated exposure control, adjustment of the mA and/or kV according to patient size and/or use of iterative reconstruction technique. COMPARISON:  CT head June 2, 23. FINDINGS: Brain: No evidence of acute infarction, hemorrhage, hydrocephalus, extra-axial collection or mass lesion/mass effect. Vascular: No hyperdense vessel identified. Skull: No acute fracture. Sinuses/Orbits: No acute finding. Other: No mastoid effusions. IMPRESSION: No evidence of acute intracranial abnormality. Electronically Signed   By: Feliberto Harts M.D.   On: 06/10/2023 19:17   DG CHEST PORT 1 VIEW Result Date: 06/10/2023 CLINICAL DATA:  [id number] Acute respiratory failure with hypoxia [facility]) [id number] EXAM: PORTABLE CHEST 1 VIEW COMPARISON:   10/13/2021 FINDINGS: Heart and mediastinal contours are within normal limits. Diffuse interstitial prominence throughout the lungs. No effusions or acute bony abnormality. Aortic atherosclerosis. IMPRESSION: Diffuse interstitial prominence throughout the lungs could reflect chronic interstitial lung disease or atypical infection. Electronically Signed   By: Charlett Nose M.D.   On: 06/10/2023 13:33   MR LUMBAR SPINE WO CONTRAST Result Date: 06/08/2023 CLINICAL DATA:  Initial evaluation for acute compression fracture, lower back pain EXAM: MRI LUMBAR SPINE WITHOUT CONTRAST TECHNIQUE: Multiplanar, multisequence MR imaging of the lumbar spine was performed. No intravenous contrast was administered. COMPARISON:  CT from earlier the same day. FINDINGS: Segmentation:  Examination degraded by motion artifact. Standard segmentation. Lowest well-formed disc space labeled the L5-S1 level. Alignment: Mild straightening of the normal lumbar lordosis with trace dextroscoliosis. No significant listhesis. Vertebrae: Acute to subacute compression fracture involving the superior endplate of L4 with mild 20% height loss and trace 2-3 mm bony retropulsion. This is benign/mechanical in appearance. Marrow edema at the adjacent inferior endplate of L3 favored to be reactive/degenerative. No convincing fracture seen at this location on prior CT. No other acute or recent fracture. Mild chronic compression deformities involving the superior endplates of T12 and L2 noted. Bone marrow signal intensity mildly heterogeneous but overall within normal limits. No worrisome osseous lesions. Reactive marrow edema present about the bilateral L3-4 facets due to facet arthritis. Postoperative changes from prior lateral screw fixation across the right SI joint noted. Conus medullaris and cauda equina: Conus extends to the L1-2 level. Conus and cauda equina appear normal. Paraspinal and other soft tissues: Mild edema noted within the psoas musculature  adjacent to the L4 fracture. Paraspinous soft tissues demonstrate no other acute finding. Asymmetric right renal atrophy with a few scattered T2 hyperintense cysts, largest of which measures 1.5 cm. These are benign in appearance, no follow-up imaging recommended. Subcentimeter simple cyst noted with subcapsular posterior right hepatic lobe, also likely benign. Disc levels: T12-L1: Minimal disc bulge. Mild bilateral facet hypertrophy. No stenosis. L1-2: Degenerative intervertebral disc space narrowing with diffuse disc bulge and disc desiccation. Superimposed irregular right subarticular to extraforaminal disc protrusion (series 6, image 10). Superimposed reactive endplate spurring. Mild facet hypertrophy. Prominence of the dorsal epidural fat. Mild spinal stenosis. Foramina remain patent. L2-3: Degenerative intervertebral disc space narrowing with diffuse disc bulge and reactive endplate spurring, asymmetric to the right. Moderate facet hypertrophy. Resultant mild spinal stenosis. Mild right L2 foraminal narrowing. Left neural foramina remains patent. L3-4: Disc desiccation with diffuse disc bulge. Possible superimposed right subarticular disc extrusion with superior migration, best seen on axial T2 weighted sequence (series 6, images 22, 21). This  is not well seen on corresponding sequences. Severe bilateral facet arthrosis with reactive marrow edema. Trace 2-3 mm bony retropulsion related to the L4 fracture. Mild epidural lipomatosis. Resultant moderate spinal stenosis. Moderate right with mild left L3 foraminal narrowing. L4-5: Advanced degenerative disc space narrowing with disc desiccation and diffuse disc bulge. Reactive endplate spurring. Moderate facet hypertrophy. Resultant mild bilateral subarticular stenosis. Central canal remains patent. Mild right greater than left L4 foraminal stenosis. L5-S1: Disc desiccation with minimal disc bulge. Small central annular fissure. Moderate left greater than right  facet hypertrophy. No significant spinal stenosis. Mild left L5 foraminal narrowing. Right neural foramen remains patent. IMPRESSION: 1. Acute to subacute compression fracture involving the superior endplate of L4 with mild 20% height loss and trace 2-3 mm bony retropulsion. This is benign/mechanical in appearance. 2. Multifactorial degenerative changes at L3-4 with resultant moderate spinal stenosis, with moderate right and mild left L3 foraminal narrowing. Possible superimposed right subarticular disc extrusion with superior migration at this level as above, not entirely certain given motion degradation on this exam. 3. Additional multilevel degenerative spondylosis and facet hypertrophy as above. Resultant mild spinal stenosis at L1-2 and L2-3. 4. Reactive marrow edema about the bilateral L3-4 facets due to facet arthritis. Finding could serve as a source for lower back pain. Electronically Signed   By: Rise Mu M.D.   On: 06/08/2023 17:39   CT Lumbar Spine Wo Contrast Result Date: 06/08/2023 CLINICAL DATA:  63 year old female with low back pain, bilateral leg pain. EXAM: CT LUMBAR SPINE WITHOUT CONTRAST TECHNIQUE: Multidetector CT imaging of the lumbar spine was performed without intravenous contrast administration. Multiplanar CT image reconstructions were also generated. RADIATION DOSE REDUCTION: This exam was performed according to the departmental dose-optimization program which includes automated exposure control, adjustment of the mA and/or kV according to patient size and/or use of iterative reconstruction technique. COMPARISON:  CT Abdomen and Pelvis 10/14/2021. FINDINGS: Segmentation: Normal. Alignment: Straightening of lumbar lordosis not significantly changed from 2023. No significant scoliosis. Mild chronic retrolisthesis of L1 on L2. Vertebrae: L4 superior endplate compression fracture appears unhealed and comminuted. 30% loss of vertebral body height. Prominent new adjacent vacuum  disc at L3-L4. Retropulsion of the posterosuperior endplate up to 6 mm. L4 pedicles and posterior elements appear intact, aligned, chronically degenerated. And degenerative appearing bilateral L4-L5 facet ankylosis appears progressed since 2023. Superimposed mildly displaced right L3 transverse process fracture is new since 2023. Other lumbar vertebrae appear stable since 2023 including mild chronic L2 superior endplate deformity. Underlying osteopenia. Superimposed chronic right SI joint arthrodesis hardware. Visible sacrum and SI joints appears stable. Paraspinal and other soft tissues: Aortoiliac calcified atherosclerosis. Chronic right renal atrophy. Noncontrast visible abdominal viscera appear stable. Distended urinary bladder. Diverticulosis of the distal large bowel. Disc levels: Stable CT appearance of chronic lumbar spine degeneration since 2023 except L3-L4: Progressed vacuum disc. Posttraumatic mild retropulsion of the L4 posterosuperior endplate and increased multifactorial moderate to severe spinal stenosis at that level (series 4, image 75 now versus series 3, image 38 there in 2023. Superimposed chronic bulky facet arthropathy. Chronic moderate to severe bilateral L3 foraminal stenosis has not significantly changed. IMPRESSION: 1. Acute to subacute: - L4 compression fracture with 30% loss of vertebral body height, mild retropulsion, and subsequent increased moderate to severe multifactorial spinal stenosis at L3-L4. Chronic L3 foraminal stenosis not significantly changed. - right L3 transverse process fracture. 2. Other chronic lumbar spine degeneration otherwise appears stable by CT since 2023. 3. Distended urinary bladder, query Urinary Retention.  4. Aortic Atherosclerosis (ICD10-I70.0). Large bowel diverticulosis. Chronic right renal atrophy. Electronically Signed   By: Odessa Fleming M.D.   On: 06/08/2023 13:01    Microbiology: Results for orders placed or performed during the hospital encounter of  06/08/23  Urine Culture     Status: None   Collection Time: 06/08/23  7:04 PM   Specimen: Urine, Clean Catch  Result Value Ref Range Status   Specimen Description   Final    URINE, CLEAN CATCH Performed at [facility], [address], [place], [place] [address]    Special Requests   Final    NONE Performed at [facility], [address] [place], [place] [address]    Culture   Final    NO GROWTH Performed at [facility], [address], [place], [place] [address]    Report Status 06/10/2023 FINAL  Final  Culture, blood (Routine X 2) w Reflex to ID Panel     Status: None   Collection Time: 06/10/23  2:14 PM   Specimen: BLOOD  Result Value Ref Range Status   Specimen Description BLOOD BLOOD RIGHT ARM  Final   Special Requests   Final    BOTTLES DRAWN AEROBIC AND ANAEROBIC Blood Culture adequate volume   Culture   Final    NO GROWTH 5 DAYS Performed at [facility], [address] [place], [place] [address]    Report Status 06/15/2023 FINAL  Final  Culture, blood (Routine X 2) w Reflex to ID Panel     Status: None   Collection Time: 06/10/23  2:17 PM   Specimen: BLOOD  Result Value Ref Range Status   Specimen Description BLOOD BLOOD LEFT ARM  Final   Special Requests   Final    Blood Culture adequate volume BOTTLES DRAWN AEROBIC AND ANAEROBIC   Culture   Final    NO GROWTH 5 DAYS Performed at [facility], [address] [place], [place] [address]    Report Status 06/15/2023 FINAL  Final  MRSA Next Gen by PCR, Nasal     Status: None   Collection Time: 06/11/23  3:42 PM   Specimen: Nasal Mucosa; Nasal Swab  Result Value Ref Range Status   MRSA by PCR Next Gen NOT DETECTED NOT DETECTED Final    Comment: (NOTE) The GeneXpert MRSA Assay (FDA approved for NASAL specimens only), is one component of a comprehensive MRSA colonization surveillance program. It is not intended to diagnose MRSA infection nor to guide or monitor treatment for MRSA infections. Test  performance is not FDA approved in patients less than 77 years old. Performed at [facility], [address], [place], [place] [address]     Labs: CBC: Recent Labs  Lab 06/11/23 [phone] 06/12/23 0513 06/13/23 0419 06/14/23 0520 06/15/23 0457  WBC 13.5* 18.8* 15.9* 10.3 10.3  HGB 12.7 13.4 12.9 11.9* 10.8*  HCT 37.5 39.4 38.9 36.0 34.5*  MCV 82.2 81.7 83.1 85.3 85.6  PLT 395 372 368 312 308   Basic Metabolic Panel: Recent Labs  Lab 06/10/23 1414 06/11/23 0428 06/12/23 0513 06/13/23 0419 06/14/23 0520 06/15/23 0457  NA 131* 137 138 141 140 139  K 2.6* 3.3* 3.1* 3.0* 3.3* 3.3*  CL 87* 98 104 114* 114* 114*  CO2 31 26 21* 15* 16* 16*  GLUCOSE 127* 123* 136* 105* 89 88  BUN 7* 8 6* 9 16 18   CREATININE 0.70 0.74 0.64 0.61 0.84 0.78  CALCIUM 8.9 9.0 8.9 8.4* 8.2* 8.2*  MG 2.2 2.0 1.6*  --   --  1.9   Liver Function Tests: No results for input(s): "AST", "ALT", "ALKPHOS", "BILITOT", "PROT", "ALBUMIN" in the last 168 hours.  CBG: Recent Labs  Lab 06/11/23 1606  GLUCAP 111*    Discharge time spent: greater than 40  minutes.  Signed: Kendell Bane, MD [facility] 06/16/2023

## 2023-06-16 NOTE — Progress Notes (Signed)
Pt refused blood draw, md notified.
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
## 2023-07-10 ENCOUNTER — Ambulatory Visit: Payer: 59 | Admitting: Urology

## 2023-07-10 DIAGNOSIS — Z09 Encounter for follow-up examination after completed treatment for conditions other than malignant neoplasm: Secondary | ICD-10-CM

## 2023-07-10 DIAGNOSIS — R339 Retention of urine, unspecified: Secondary | ICD-10-CM
